# Patient Record
Sex: Female | Born: 1985 | Race: Black or African American | Hispanic: No | Marital: Single | Smoking: Current every day smoker
Health system: Southern US, Community
[De-identification: ages and names within clinical notes are randomized; demographics above are authoritative.]

## PROBLEM LIST (undated history)

## (undated) ENCOUNTER — Inpatient Hospital Stay (HOSPITAL_COMMUNITY): Payer: Self-pay

## (undated) DIAGNOSIS — R51 Headache: Secondary | ICD-10-CM

## (undated) DIAGNOSIS — G5600 Carpal tunnel syndrome, unspecified upper limb: Secondary | ICD-10-CM

## (undated) DIAGNOSIS — D649 Anemia, unspecified: Secondary | ICD-10-CM

## (undated) DIAGNOSIS — D573 Sickle-cell trait: Secondary | ICD-10-CM

## (undated) DIAGNOSIS — I10 Essential (primary) hypertension: Secondary | ICD-10-CM

## (undated) DIAGNOSIS — O139 Gestational [pregnancy-induced] hypertension without significant proteinuria, unspecified trimester: Secondary | ICD-10-CM

## (undated) DIAGNOSIS — F329 Major depressive disorder, single episode, unspecified: Secondary | ICD-10-CM

## (undated) DIAGNOSIS — IMO0002 Reserved for concepts with insufficient information to code with codable children: Secondary | ICD-10-CM

## (undated) DIAGNOSIS — F32A Depression, unspecified: Secondary | ICD-10-CM

## (undated) HISTORY — PX: DILATION AND CURETTAGE OF UTERUS: SHX78

## (undated) HISTORY — DX: Reserved for concepts with insufficient information to code with codable children: IMO0002

## (undated) HISTORY — DX: Sickle-cell trait: D57.3

## (undated) HISTORY — PX: INDUCED ABORTION: SHX677

---
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
## 2008-09-25 ENCOUNTER — Emergency Department (HOSPITAL_BASED_OUTPATIENT_CLINIC_OR_DEPARTMENT_OTHER): Admission: EM | Admit: 2008-09-25 | Discharge: 2008-09-25 | Payer: Self-pay | Admitting: Emergency Medicine

## 2009-04-12 ENCOUNTER — Emergency Department (HOSPITAL_BASED_OUTPATIENT_CLINIC_OR_DEPARTMENT_OTHER): Admission: EM | Admit: 2009-04-12 | Discharge: 2009-04-12 | Payer: Self-pay | Admitting: Emergency Medicine

## 2009-04-12 ENCOUNTER — Ambulatory Visit: Payer: Self-pay | Admitting: Interventional Radiology

## 2009-04-16 ENCOUNTER — Emergency Department (HOSPITAL_BASED_OUTPATIENT_CLINIC_OR_DEPARTMENT_OTHER): Admission: EM | Admit: 2009-04-16 | Discharge: 2009-04-16 | Payer: Self-pay | Admitting: Emergency Medicine

## 2009-04-26 ENCOUNTER — Emergency Department (HOSPITAL_BASED_OUTPATIENT_CLINIC_OR_DEPARTMENT_OTHER): Admission: EM | Admit: 2009-04-26 | Discharge: 2009-04-27 | Payer: Self-pay | Admitting: Emergency Medicine

## 2009-05-01 ENCOUNTER — Ambulatory Visit (HOSPITAL_BASED_OUTPATIENT_CLINIC_OR_DEPARTMENT_OTHER): Admission: RE | Admit: 2009-05-01 | Discharge: 2009-05-01 | Payer: Self-pay | Admitting: Emergency Medicine

## 2009-05-01 ENCOUNTER — Ambulatory Visit: Payer: Self-pay | Admitting: Radiology

## 2009-05-03 ENCOUNTER — Inpatient Hospital Stay (HOSPITAL_COMMUNITY): Admission: AD | Admit: 2009-05-03 | Discharge: 2009-05-03 | Payer: Self-pay | Admitting: Obstetrics & Gynecology

## 2009-08-27 ENCOUNTER — Emergency Department (HOSPITAL_BASED_OUTPATIENT_CLINIC_OR_DEPARTMENT_OTHER): Admission: EM | Admit: 2009-08-27 | Discharge: 2009-08-27 | Payer: Self-pay | Admitting: Emergency Medicine

## 2009-11-12 ENCOUNTER — Ambulatory Visit: Payer: Self-pay | Admitting: Advanced Practice Midwife

## 2009-11-12 ENCOUNTER — Inpatient Hospital Stay (HOSPITAL_COMMUNITY): Admission: AD | Admit: 2009-11-12 | Discharge: 2009-11-12 | Payer: Self-pay | Admitting: Obstetrics and Gynecology

## 2010-06-24 ENCOUNTER — Emergency Department (HOSPITAL_BASED_OUTPATIENT_CLINIC_OR_DEPARTMENT_OTHER)
Admission: EM | Admit: 2010-06-24 | Discharge: 2010-06-24 | Disposition: A | Discharge status: Home / Self Care | Payer: Medicaid Other | Attending: Emergency Medicine | Admitting: Emergency Medicine

## 2010-06-24 DIAGNOSIS — I10 Essential (primary) hypertension: Secondary | ICD-10-CM | POA: Insufficient documentation

## 2010-06-24 DIAGNOSIS — J329 Chronic sinusitis, unspecified: Secondary | ICD-10-CM | POA: Insufficient documentation

## 2010-06-24 DIAGNOSIS — F17200 Nicotine dependence, unspecified, uncomplicated: Secondary | ICD-10-CM | POA: Insufficient documentation

## 2010-07-06 LAB — GC/CHLAMYDIA PROBE AMP, GENITAL: GC Probe Amp, Genital: NEGATIVE

## 2010-07-06 LAB — CBC
Hemoglobin: 13.1 g/dL (ref 12.0–15.0)
MCHC: 34.1 g/dL (ref 30.0–36.0)
Platelets: 111 10*3/uL — ABNORMAL LOW (ref 150–400)
RBC: 4.61 MIL/uL (ref 3.87–5.11)
RDW: 14.1 % (ref 11.5–15.5)
WBC: 7.9 10*3/uL (ref 4.0–10.5)

## 2010-07-06 LAB — HCG, QUANTITATIVE, PREGNANCY
hCG, Beta Chain, Quant, S: 49173 m[IU]/mL — ABNORMAL HIGH (ref <5)
hCG, Beta Chain, Quant, S: 13813 m[IU]/mL — ABNORMAL HIGH (ref <5)

## 2010-07-06 LAB — URINE MICROSCOPIC-ADD ON

## 2010-07-06 LAB — URINALYSIS, ROUTINE W REFLEX MICROSCOPIC
Nitrite: NEGATIVE
Protein, ur: NEGATIVE mg/dL
Urobilinogen, UA: 4 mg/dL — ABNORMAL HIGH (ref 0.0–1.0)

## 2010-07-06 LAB — ABO/RH: ABO/RH(D): O POS

## 2010-07-21 LAB — CULTURE, ROUTINE-ABSCESS: Gram Stain: NONE SEEN

## 2010-07-21 LAB — URINALYSIS, ROUTINE W REFLEX MICROSCOPIC
Ketones, ur: 15 mg/dL — AB
Nitrite: NEGATIVE
Protein, ur: NEGATIVE mg/dL
Specific Gravity, Urine: 1.029 (ref 1.005–1.030)

## 2010-07-21 LAB — ABO/RH: ABO/RH(D): O POS

## 2010-07-21 LAB — GC/CHLAMYDIA PROBE AMP, GENITAL
Chlamydia, DNA Probe: NEGATIVE
GC Probe Amp, Genital: NEGATIVE

## 2010-07-21 LAB — WET PREP, GENITAL: Trich, Wet Prep: NONE SEEN

## 2010-07-28 LAB — URINALYSIS, ROUTINE W REFLEX MICROSCOPIC
Bilirubin Urine: NEGATIVE
Glucose, UA: NEGATIVE mg/dL
Hgb urine dipstick: NEGATIVE
Specific Gravity, Urine: 1.023 (ref 1.005–1.030)

## 2010-07-28 LAB — URINE MICROSCOPIC-ADD ON

## 2010-07-28 LAB — GC/CHLAMYDIA PROBE AMP, GENITAL
Chlamydia, DNA Probe: NEGATIVE
GC Probe Amp, Genital: NEGATIVE

## 2010-08-18 ENCOUNTER — Emergency Department (INDEPENDENT_AMBULATORY_CARE_PROVIDER_SITE_OTHER): Payer: Self-pay

## 2010-08-18 ENCOUNTER — Emergency Department (HOSPITAL_BASED_OUTPATIENT_CLINIC_OR_DEPARTMENT_OTHER)
Admission: EM | Admit: 2010-08-18 | Discharge: 2010-08-18 | Disposition: A | Discharge status: Home / Self Care | Payer: Self-pay | Attending: Emergency Medicine | Admitting: Emergency Medicine

## 2010-08-18 DIAGNOSIS — Y92009 Unspecified place in unspecified non-institutional (private) residence as the place of occurrence of the external cause: Secondary | ICD-10-CM | POA: Insufficient documentation

## 2010-08-18 DIAGNOSIS — IMO0002 Reserved for concepts with insufficient information to code with codable children: Secondary | ICD-10-CM | POA: Insufficient documentation

## 2010-08-18 DIAGNOSIS — W268XXA Contact with other sharp object(s), not elsewhere classified, initial encounter: Secondary | ICD-10-CM

## 2010-08-18 DIAGNOSIS — F17200 Nicotine dependence, unspecified, uncomplicated: Secondary | ICD-10-CM | POA: Insufficient documentation

## 2010-08-18 DIAGNOSIS — S61209A Unspecified open wound of unspecified finger without damage to nail, initial encounter: Secondary | ICD-10-CM

## 2010-08-18 DIAGNOSIS — I10 Essential (primary) hypertension: Secondary | ICD-10-CM | POA: Insufficient documentation

## 2010-09-05 NOTE — Discharge Summary (Signed)
Alison Coffey, Alison Coffey            ACCOUNT NO.:  [id number]   MEDICAL RECORD NO.:  [id number]          PATIENT TYPE:  INP   LOCATION:  9319                          FACILITY:  [facility]   PHYSICIAN:  Conni Elliot, M.D.DATE OF BIRTH:  1985/07/01   DATE OF ADMISSION:  07/08/2004  DATE OF DISCHARGE:  07/13/2004                                 DISCHARGE SUMMARY   ADMISSION DIAGNOSES:  25 year old gravida 1 presenting at 25 weeks and 4  days with preterm labor.   DISCHARGE DIAGNOSES:  1.  Status post spontaneous vaginal delivery at 26 weeks.  2.  Viable infant now in the neonatal intensive care unit at [facility].   DISCHARGE MEDICATIONS:  1.  Prenatal vitamins one tablet p.o. daily.  2.  Ibuprofen 600 mg one tablet p.o. q.8h p.r.n. pain.   HISTORY OF PRESENT ILLNESS:  The patient is an 25 year old gravida 1  presenting at 25 weeks and 4 days by LMP and 6-week ultrasound.  She had  been cramping for 2 days and went to her outside PCP and was found to have a  2 cm cervix with a bulging bag of water that was negative for fern with no  pooling.  The patient was feeling contractions.  There had been a  questionable loss of fluid.  At the outside facility, she was given a 6 gram  magnesium load and 2 grams an hour.  The patient reports that her cramping  decreased after the magnesium.  The patient was given Indomethacin p.o. and  betamethasone along with ampicillin before transfer.   HOSPITAL COURSE:  Problem 1.  The patient was admitted to [facility].  The patient was placed on tocometer and electronic fetal monitoring.  The  patient was continued on magnesium, Unasyn, and betamethasone.  NICU was  consulted and the patient was aware of the possibility of preterm delivery.  Over the following 2 days, the patient had approximately one episode of  cramping per day, but this resolved.  The patient noted continuous fetal  movement with no more loss of fluid.  On March 24, the  patient stated that  she was not feeling any contractions, however, vaginal examination noted the  vertex to be at a +2 to +3 station with eminent delivery likely.   The patient delivered at 1757 on July 11, 2004, over an intact perineum in  LOA position of a female.  There was spontaneous cry and breathing.  The  infant was bulb suctioned and taken to the NICU.  Per report of the NICU  staff, the baby was intubated on day of life #1, but was transitioned to  nasal cannula.   The mother's recovery was otherwise unremarkable and was controlled, her  bleeding decreased, and her hemoglobin was stable at 9.9.  The patient is to  be discharged today with follow-up with her primary care doctor in [facility].  She is planning on discussing IUD for contraception at follow-up.    GSD/MEDQ  D:  07/13/2004  T:  07/14/2004  Job:  [id number]   cc:  Arther Abbott, M.D., [facility]

## 2011-03-02 ENCOUNTER — Emergency Department (HOSPITAL_BASED_OUTPATIENT_CLINIC_OR_DEPARTMENT_OTHER)
Admission: EM | Admit: 2011-03-02 | Discharge: 2011-03-02 | Discharge status: Against Medical Advice | Payer: Managed Care, Other (non HMO) | Attending: Emergency Medicine | Admitting: Emergency Medicine

## 2011-03-02 ENCOUNTER — Encounter: Payer: Self-pay | Admitting: *Deleted

## 2011-03-02 DIAGNOSIS — R109 Unspecified abdominal pain: Secondary | ICD-10-CM | POA: Insufficient documentation

## 2011-03-02 NOTE — ED Notes (Signed)
Unable to locate pt in waiting room.

## 2011-03-02 NOTE — ED Notes (Signed)
Pt.  Went to her Dr. Joanna Puff for an injection to have an abortion under [redacted] wks pregnant at the time.  Pt. Reports abd. Pain in the lower abd. With no bleeding at present time.  Bleeding stopped on Sat.

## 2011-03-10 ENCOUNTER — Encounter (HOSPITAL_BASED_OUTPATIENT_CLINIC_OR_DEPARTMENT_OTHER): Payer: Self-pay | Admitting: *Deleted

## 2011-03-10 ENCOUNTER — Emergency Department (INDEPENDENT_AMBULATORY_CARE_PROVIDER_SITE_OTHER): Payer: Managed Care, Other (non HMO)

## 2011-03-10 ENCOUNTER — Emergency Department (HOSPITAL_BASED_OUTPATIENT_CLINIC_OR_DEPARTMENT_OTHER)
Admission: EM | Admit: 2011-03-10 | Discharge: 2011-03-10 | Disposition: A | Discharge status: Home / Self Care | Payer: Managed Care, Other (non HMO) | Attending: Emergency Medicine | Admitting: Emergency Medicine

## 2011-03-10 DIAGNOSIS — F17200 Nicotine dependence, unspecified, uncomplicated: Secondary | ICD-10-CM | POA: Insufficient documentation

## 2011-03-10 DIAGNOSIS — O26859 Spotting complicating pregnancy, unspecified trimester: Secondary | ICD-10-CM

## 2011-03-10 DIAGNOSIS — R0989 Other specified symptoms and signs involving the circulatory and respiratory systems: Secondary | ICD-10-CM

## 2011-03-10 DIAGNOSIS — N719 Inflammatory disease of uterus, unspecified: Secondary | ICD-10-CM | POA: Insufficient documentation

## 2011-03-10 DIAGNOSIS — O034 Incomplete spontaneous abortion without complication: Secondary | ICD-10-CM

## 2011-03-10 DIAGNOSIS — R109 Unspecified abdominal pain: Secondary | ICD-10-CM

## 2011-03-10 DIAGNOSIS — O021 Missed abortion: Secondary | ICD-10-CM | POA: Insufficient documentation

## 2011-03-10 LAB — URINALYSIS, ROUTINE W REFLEX MICROSCOPIC
Bilirubin Urine: NEGATIVE
Glucose, UA: NEGATIVE mg/dL
Ketones, ur: NEGATIVE mg/dL
Protein, ur: NEGATIVE mg/dL

## 2011-03-10 LAB — WET PREP, GENITAL
Trich, Wet Prep: NONE SEEN
Yeast Wet Prep HPF POC: NONE SEEN

## 2011-03-10 LAB — CBC
Hemoglobin: 11.9 g/dL — ABNORMAL LOW (ref 12.0–15.0)
MCHC: 36.5 g/dL — ABNORMAL HIGH (ref 30.0–36.0)
WBC: 6.6 10*3/uL (ref 4.0–10.5)

## 2011-03-10 LAB — DIFFERENTIAL
Basophils Absolute: 0 10*3/uL (ref 0.0–0.1)
Basophils Relative: 0 % (ref 0–1)
Eosinophils Absolute: 0.1 10*3/uL (ref 0.0–0.7)
Lymphocytes Relative: 31 % (ref 12–46)
Monocytes Relative: 9 % (ref 3–12)
Neutrophils Relative %: 59 % (ref 43–77)

## 2011-03-10 LAB — URINE MICROSCOPIC-ADD ON

## 2011-03-10 LAB — HCG, QUANTITATIVE, PREGNANCY: hCG, Beta Chain, Quant, S: 9159 m[IU]/mL — ABNORMAL HIGH (ref <5)

## 2011-03-10 MED ORDER — DOXYCYCLINE HYCLATE 100 MG PO CAPS: 100.0000 mg | ORAL_CAPSULE | Freq: Two times a day (BID) | ORAL | Status: AC

## 2011-03-10 MED ORDER — METRONIDAZOLE 500 MG PO TABS: 500.0000 mg | ORAL_TABLET | Freq: Two times a day (BID) | ORAL | Status: AC

## 2011-03-10 MED ORDER — CEFTRIAXONE SODIUM 250 MG IJ SOLR: 250.0000 mg | Freq: Once | INTRAMUSCULAR | Status: DC

## 2011-03-10 NOTE — ED Provider Notes (Signed)
History     CSN: [id number] Arrival date & time: 03/10/2011  9:45 AM   First MD Initiated Contact with Patient 03/10/11 [phone]      Chief Complaint  Patient presents with  . Abdominal Pain    (Consider location/radiation/quality/duration/timing/severity/associated sxs/prior treatment) Patient is a 25 y.o. female presenting with abdominal pain. The history is provided by the patient.  Abdominal Pain The primary symptoms of the illness include abdominal pain, vaginal discharge and vaginal bleeding. The primary symptoms of the illness do not include fever.  Symptoms associated with the illness do not include chills or back pain.   patient states she's had some vaginal pain and bleeding last few days. At the end of October or the beginning November she had methotrexate for an abortion. She's done well until recently. No fevers. She states she's had a little bit of a discharge. No fevers. She had a little bit dysuria, but that has resolved. She states that after sex the last time she's had some blood. She has had PID previously, and states this feels somewhat like that.  History reviewed. No pertinent past medical history.  Past Surgical History  Procedure Date  . Induced abortion   . Induced abortion     No family history on file.  History  Substance Use Topics  . Smoking status: Current Everyday Smoker  . Smokeless tobacco: Not on file  . Alcohol Use: Yes    OB History    Grav Para Term Preterm Abortions TAB SAB Ect Mult Living                  Review of Systems  Constitutional: Negative for fever and chills.  Respiratory: Negative for chest tightness.   Gastrointestinal: Positive for abdominal pain.  Genitourinary: Positive for vaginal bleeding, vaginal discharge, vaginal pain and pelvic pain.  Musculoskeletal: Negative for back pain.  Neurological: Negative for weakness.    Allergies  Review of patient's allergies indicates no known allergies.  Home Medications    Current Outpatient Rx  Name Route Sig Dispense Refill  . NATALCARE PIC 60-1 MG PO TABS Oral Take 1 tablet by mouth daily with breakfast.      . DOXYCYCLINE HYCLATE 100 MG PO CAPS Oral Take 1 capsule (100 mg total) by mouth 2 (two) times daily. 28 capsule 0  . METRONIDAZOLE 500 MG PO TABS Oral Take 1 tablet (500 mg total) by mouth 2 (two) times daily. 28 tablet 0    BP 112/68  Pulse 81  Temp(Src) 98.6 F (37 C) (Oral)  Resp 18  SpO2 100%  LMP 01/06/2011  Physical Exam  Nursing note and vitals reviewed. Constitutional: She is oriented to person, place, and time. She appears well-developed and well-nourished.  HENT:  Head: Normocephalic and atraumatic.  Eyes: EOM are normal. Pupils are equal, round, and reactive to light.  Neck: Normal range of motion. Neck supple.  Cardiovascular: Normal rate, regular rhythm and normal heart sounds.   No murmur heard. Pulmonary/Chest: Effort normal and breath sounds normal. No respiratory distress. She has no wheezes. She has no rales.  Abdominal: Soft. Bowel sounds are normal. She exhibits no distension. There is tenderness (suprapubic tenderness without rebound or guarding). There is no rebound and no guarding.  Genitourinary:       Patient has redness at the cervical os along with PERRLA discharge. Some pain with cervical motion. No adnexal mass felt.  Musculoskeletal: Normal range of motion.  Neurological: She is alert and oriented to person,  place, and time. No cranial nerve deficit.  Skin: Skin is warm and dry.  Psychiatric: She has a normal mood and affect. Her speech is normal.    ED Course  Procedures (including critical care time)  Labs Reviewed  WET PREP, GENITAL - Abnormal; Notable for the following:    Clue Cells, Wet Prep FEW (*)    WBC, Wet Prep HPF POC MANY (*)    All other components within normal limits  URINALYSIS, ROUTINE W REFLEX MICROSCOPIC - Abnormal; Notable for the following:    Hgb urine dipstick TRACE (*)     Leukocytes, UA MODERATE (*)    All other components within normal limits  URINE MICROSCOPIC-ADD ON - Abnormal; Notable for the following:    Squamous Epithelial / LPF FEW (*)    Bacteria, UA FEW (*)    All other components within normal limits  HCG, QUANTITATIVE, PREGNANCY - Abnormal; Notable for the following:    hCG, Beta Chain, Quant, S 9159 (*)    All other components within normal limits  CBC - Abnormal; Notable for the following:    Hemoglobin 11.9 (*)    HCT 32.6 (*)    MCV 74.8 (*)    MCHC 36.5 (*)    Platelets 136 (*)    All other components within normal limits  PREGNANCY, URINE  DIFFERENTIAL  GC/CHLAMYDIA PROBE AMP, GENITAL   US Ob Comp Less 14 Wks  03/10/2011  *RADIOLOGY REPORT*  Clinical Data: Abdominal and pelvic pain.  Vaginal bleeding and discharge.  3 weeks status post induced abortion.  Positive pregnancy test.  OBSTETRIC <14 WK ULTRASOUND AND TRANSVAGINAL OB US  Technique:  Transabdominal and transvaginal ultrasound was performed for evaluation of the gestation as well as the maternal uterus and adnexal regions.  Comparison:  None.  Intrauterine gestational sac: Visualized/irregular in shape. Yolk sac: Visualized Embryo: Visualized Cardiac Activity: None  CRL:  11 mm  7w  2d           [place] EDC: 10/25/2011  Maternal uterus/Adnexae: No subchorionic hemorrhage identified.  No evidence of fibroids. Both ovaries are normal appearance.  No evidence of adnexal mass or free fluid.  IMPRESSION:  1.  Failed IUP measuring 7 weeks 2 days. 2.  No evidence of adnexal mass or free fluid.  Original Report Authenticated By: Danae Orleans, M.D.   US Ob Transvaginal  03/10/2011  *RADIOLOGY REPORT*  Clinical Data: Abdominal and pelvic pain.  Vaginal bleeding and discharge.  3 weeks status post induced abortion.  Positive pregnancy test.  OBSTETRIC <14 WK ULTRASOUND AND TRANSVAGINAL OB US  Technique:  Transabdominal and transvaginal ultrasound was performed for evaluation of the gestation as  well as the maternal uterus and adnexal regions.  Comparison:  None.  Intrauterine gestational sac: Visualized/irregular in shape. Yolk sac: Visualized Embryo: Visualized Cardiac Activity: None  CRL:  11 mm  7w  2d           [place] EDC: 10/25/2011  Maternal uterus/Adnexae: No subchorionic hemorrhage identified.  No evidence of fibroids. Both ovaries are normal appearance.  No evidence of adnexal mass or free fluid.  IMPRESSION:  1.  Failed IUP measuring 7 weeks 2 days. 2.  No evidence of adnexal mass or free fluid.  Original Report Authenticated By: Danae Orleans, M.D.     1. Retained products of conception following abortion   2. Endometritis       MDM  Patient comes in after having pharmacologic abortion. It was about  the end of October. She states she's had an increase in vaginal discharge and pain. Her quantitative hCG is elevated at 9000. Per patient he was about 6000 at the time of the abortion. Pelvic exam shows PERRLA drainage from cervix. She does have many white cells, wet prep. Ultrasound shows a failed intrauterine pregnancy measuring 7 weeks. I attempted to contact her primary OB/GYN Dr. Shawnie Pons, however his office was closed at this time. Patient states she cannot wait for me to contact him and she will call him. She will be treated as endometritis. She is well-appearing and does not appear septic. She'll be discharged.        Juliet Rude. Rubin Payor, MD 03/10/11 1250

## 2011-03-10 NOTE — ED Notes (Signed)
Patient states that she has had intermittent abd pain since an induced abortion, spotting

## 2011-03-11 LAB — GC/CHLAMYDIA PROBE AMP, GENITAL: GC Probe Amp, Genital: NEGATIVE

## 2011-06-24 ENCOUNTER — Encounter (HOSPITAL_BASED_OUTPATIENT_CLINIC_OR_DEPARTMENT_OTHER): Payer: Self-pay | Admitting: *Deleted

## 2011-06-24 ENCOUNTER — Emergency Department (HOSPITAL_BASED_OUTPATIENT_CLINIC_OR_DEPARTMENT_OTHER)
Admission: EM | Admit: 2011-06-24 | Discharge: 2011-06-24 | Disposition: A | Discharge status: Home Health | Payer: Self-pay | Attending: Emergency Medicine | Admitting: Emergency Medicine

## 2011-06-24 DIAGNOSIS — R509 Fever, unspecified: Secondary | ICD-10-CM | POA: Insufficient documentation

## 2011-06-24 DIAGNOSIS — R1110 Vomiting, unspecified: Secondary | ICD-10-CM | POA: Insufficient documentation

## 2011-06-24 DIAGNOSIS — R197 Diarrhea, unspecified: Secondary | ICD-10-CM | POA: Insufficient documentation

## 2011-06-24 DIAGNOSIS — F17200 Nicotine dependence, unspecified, uncomplicated: Secondary | ICD-10-CM | POA: Insufficient documentation

## 2011-06-24 LAB — URINALYSIS, ROUTINE W REFLEX MICROSCOPIC
Glucose, UA: NEGATIVE mg/dL
Protein, ur: 30 mg/dL — AB
Urobilinogen, UA: 2 mg/dL — ABNORMAL HIGH (ref 0.0–1.0)

## 2011-06-24 LAB — WET PREP, GENITAL: Yeast Wet Prep HPF POC: NONE SEEN

## 2011-06-24 LAB — PREGNANCY, URINE: Preg Test, Ur: NEGATIVE

## 2011-06-24 MED ORDER — ONDANSETRON HCL 4 MG/2ML IJ SOLN
4.0000 mg | Freq: Once | INTRAMUSCULAR | Status: AC
  Administered 2011-06-24: 4 mg via INTRAVENOUS
  Filled 2011-06-24: qty 2

## 2011-06-24 MED ORDER — FLUCONAZOLE 100 MG PO TABS
200.0000 mg | ORAL_TABLET | Freq: Once | ORAL | Status: AC
  Administered 2011-06-24: 200 mg via ORAL
  Filled 2011-06-24: qty 2

## 2011-06-24 MED ORDER — SODIUM CHLORIDE 0.9 % IV BOLUS (SEPSIS)
1000.0000 mL | Freq: Once | INTRAVENOUS | Status: AC
  Administered 2011-06-24: 1000 mL via INTRAVENOUS

## 2011-06-24 NOTE — ED Notes (Signed)
Pt c/o vomiting and lose stools x 3 days

## 2011-06-24 NOTE — ED Notes (Signed)
MD at bedside. 

## 2011-06-24 NOTE — ED Provider Notes (Addendum)
History     CSN: [id number]  Arrival date & time 06/24/11  1306   First MD Initiated Contact with Patient 06/24/11 1324      Chief Complaint  Patient presents with  . Emesis  . Diarrhea    (Consider location/radiation/quality/duration/timing/severity/associated sxs/prior treatment) Patient is a 26 y.o. female presenting with vomiting and diarrhea. The history is provided by the patient.  Emesis  This is a new problem. The current episode started 2 days ago. The problem occurs 2 to 4 times per day. The problem has been resolved. The emesis has an appearance of stomach contents. The maximum temperature recorded prior to her arrival was 100 to 100.9 F. The fever has been present for less than 1 day. Associated symptoms include abdominal pain, chills, diarrhea and a fever. Pertinent negatives include no cough and no URI. Risk factors include ill contacts (The same symptoms 2 days prior).  Diarrhea The primary symptoms include fever, abdominal pain, vomiting and diarrhea.  The diarrhea is watery. The diarrhea occurs 5 to 10 times per day.  The illness is also significant for chills and anorexia. Associated medical issues do not include GERD, gallstones or liver disease. Risk factors: None.    History reviewed. No pertinent past medical history.  Past Surgical History  Procedure Date  . Induced abortion   . Induced abortion     History reviewed. No pertinent family history.  History  Substance Use Topics  . Smoking status: Current Everyday Smoker  . Smokeless tobacco: Not on file  . Alcohol Use: Yes    OB History    Grav Para Term Preterm Abortions TAB SAB Ect Mult Living                  Review of Systems  Constitutional: Positive for fever and chills.  Respiratory: Negative for cough.   Gastrointestinal: Positive for vomiting, abdominal pain, diarrhea and anorexia.  All other systems reviewed and are negative.    Allergies  Review of patient's allergies indicates  no known allergies.  Home Medications   Current Outpatient Rx  Name Route Sig Dispense Refill  . NATALCARE PIC 60-1 MG PO TABS Oral Take 1 tablet by mouth daily with breakfast.        BP 127/78  Pulse 83  Temp(Src) 98.3 F (36.8 C) (Oral)  Resp 16  Ht 5\' 6"  (1.676 m)  Wt 160 lb (72.576 kg)  BMI 25.82 kg/m2  SpO2 100%  LMP 06/24/2011  Physical Exam  Nursing note and vitals reviewed. Constitutional: She is oriented to person, place, and time. She appears well-developed and well-nourished. No distress.  HENT:  Head: Normocephalic and atraumatic.  Eyes: EOM are normal. Pupils are equal, round, and reactive to light.  Cardiovascular: Normal rate, regular rhythm, normal heart sounds and intact distal pulses.  Exam reveals no friction rub.   No murmur heard. Pulmonary/Chest: Effort normal and breath sounds normal. She has no wheezes. She has no rales.  Abdominal: Soft. Bowel sounds are normal. She exhibits no distension. There is tenderness in the right lower quadrant. There is no rebound, no guarding and no CVA tenderness.       Mild tenderness in her right lower quadrant  Genitourinary: Uterus normal. Cervix exhibits no motion tenderness, no discharge and no friability. Right adnexum displays no tenderness. Left adnexum displays no tenderness. There is bleeding around the vagina.  Musculoskeletal: Normal range of motion. She exhibits no tenderness.       No edema  Neurological: She is alert and oriented to person, place, and time. No cranial nerve deficit.  Skin: Skin is warm and dry. No rash noted.  Psychiatric: She has a normal mood and affect. Her behavior is normal.    ED Course  Procedures (including critical care time)  Labs Reviewed  URINALYSIS, ROUTINE W REFLEX MICROSCOPIC - Abnormal; Notable for the following:    Color, Urine RED (*) BIOCHEMICALS MAY BE AFFECTED BY COLOR   APPearance CLOUDY (*)    Specific Gravity, Urine 1.034 (*)    Hgb urine dipstick LARGE (*)     Bilirubin Urine SMALL (*)    Protein, ur 30 (*)    Urobilinogen, UA 2.0 (*)    Leukocytes, UA SMALL (*)    All other components within normal limits  URINE MICROSCOPIC-ADD ON - Abnormal; Notable for the following:    Squamous Epithelial / LPF FEW (*)    Bacteria, UA MANY (*)    All other components within normal limits  PREGNANCY, URINE   No results found.   1. Vomiting and diarrhea       MDM   Pt with symptoms most consistent with a viral process with fever/vomitting/diarrhea.  Denies bad food exposure and recent travel out of the country.  No recent abx.  No hx concerning for GU pathology or kidney stones.  Pt is awake and alert on exam without peritoneal signs.  Time with the same symptoms 2 days before her started. She has mild abdominal pain on exam in the right lower quadrant. Will give IV fluids and recheck her persistent abdominal pain.  2:29 PM After IV fluids and Zofran on reevaluation patient has no right lower quadrant pain. She was not given any pain medication during her stay in the abdominal pain resolved after fluids. Low suspicion for appendicitis. Patient given crackers and ginger ale without any further vomiting. UA showed a large amount of blood and small leukocytes  however feel this was a contaminated sample. Patient started complaining of any dysuria and currently is on her menses.  Patient denies any dysuria however she states she has been having vaginal itching without discharge for the last one week.  3:29 PM Wet prep showed moderate white blood cells and signs of bacterial vaginosis. Will treat for yeast as she states the Monistat helped some. Patient will followup with her GYN     Gwyneth Sprout, MD 06/24/11 1430  Gwyneth Sprout, MD 06/24/11 1456  Gwyneth Sprout, MD 06/24/11 1610  Gwyneth Sprout, MD 06/24/11 1529

## 2011-06-24 NOTE — ED Notes (Signed)
Secondary Assessment- Pt reports nausea, diarrhea and vomiting since Monday.  States only nausea today but has had numerous episodes of diarrhea today.

## 2011-06-24 NOTE — Discharge Instructions (Signed)
Diet for Diarrhea, Adult Having frequent, runny stools (diarrhea) has many causes. Diarrhea may be caused or worsened by food or drink. Diarrhea may be relieved by changing your diet. IF YOU ARE NOT TOLERATING SOLID FOODS:  Drink enough water and fluids to keep your urine clear or pale yellow.   Avoid sugary drinks and sodas as well as milk-based beverages.   Avoid beverages containing caffeine and alcohol.   You may try rehydrating beverages. You can make your own by following this recipe:    tsp table salt.    tsp baking soda.   ? tsp salt substitute (potassium chloride).   1 tbs + 1 tsp sugar.   1 qt water.  As your stools become more solid, you can start eating solid foods. Add foods one at a time. If a certain food causes your diarrhea to get worse, avoid that food and try other foods. A low fiber, low-fat, and lactose-free diet is recommended. Small, frequent meals may be better tolerated.  Starches  Allowed:  White, French, and pita breads, plain rolls, buns, bagels. Plain muffins, matzo. Soda, saltine, or graham crackers. Pretzels, melba toast, zwieback. Cooked cereals made with water: cornmeal, farina, cream cereals. Dry cereals: refined corn, wheat, rice. Potatoes prepared any way without skins, refined macaroni, spaghetti, noodles, refined rice.   Avoid:  Bread, rolls, or crackers made with whole wheat, multi-grains, rye, bran seeds, nuts, or coconut. Corn tortillas or taco shells. Cereals containing whole grains, multi-grains, bran, coconut, nuts, or raisins. Cooked or dry oatmeal. Coarse wheat cereals, granola. Cereals advertised as "high-fiber." Potato skins. Whole grain pasta, wild or brown rice. Popcorn. Sweet potatoes/yams. Sweet rolls, doughnuts, waffles, pancakes, sweet breads.  Vegetables  Allowed: Strained tomato and vegetable juices. Most well-cooked and canned vegetables without seeds. Fresh: Tender lettuce, cucumber without the skin, cabbage, spinach, bean  sprouts.   Avoid: Fresh, cooked, or canned: Artichokes, baked beans, beet greens, broccoli, Brussels sprouts, corn, kale, legumes, peas, sweet potatoes. Cooked: Green or red cabbage, spinach. Avoid large servings of any vegetables, because vegetables shrink when cooked, and they contain more fiber per serving than fresh vegetables.  Fruit  Allowed: All fruit juices except prune juice. Cooked or canned: Apricots, applesauce, cantaloupe, cherries, fruit cocktail, grapefruit, grapes, kiwi, mandarin oranges, peaches, pears, plums, watermelon. Fresh: Apples without skin, ripe banana, grapes, cantaloupe, cherries, grapefruit, peaches, oranges, plums. Keep servings limited to  cup or 1 piece.   Avoid: Fresh: Apple with skin, apricots, mango, pears, raspberries, strawberries. Prune juice, stewed or dried prunes. Dried fruits, raisins, dates. Large servings of all fresh fruits.  Meat and Meat Substitutes  Allowed: Ground or well-cooked tender beef, ham, veal, lamb, pork, or poultry. Eggs, plain cheese. Fish, oysters, shrimp, lobster, other seafoods. Liver, organ meats.   Avoid: Tough, fibrous meats with gristle. Peanut butter, smooth or chunky. Cheese, nuts, seeds, legumes, dried peas, beans, lentils.  Milk  Allowed: Yogurt, lactose-free milk, kefir, drinkable yogurt, buttermilk, soy milk.   Avoid: Milk, chocolate milk, beverages made with milk, such as milk shakes.  Soups  Allowed: Bouillon, broth, or soups made from allowed foods. Any strained soup.   Avoid: Soups made from vegetables that are not allowed, cream or milk-based soups.  Desserts and Sweets  Allowed: Sugar-free gelatin, sugar-free frozen ice pops made without sugar alcohol.   Avoid: Plain cakes and cookies, pie made with allowed fruit, pudding, custard, cream pie. Gelatin, fruit, ice, sherbet, frozen ice pops. Ice cream, ice milk without nuts. Plain hard candy,   honey, jelly, molasses, syrup, sugar, chocolate syrup, gumdrops,  marshmallows.  Fats and Oils  Allowed: Avoid any fats and oils.   Avoid: Seeds, nuts, olives, avocados. Margarine, butter, cream, mayonnaise, salad oils, plain salad dressings made from allowed foods. Plain gravy, crisp bacon without rind.  Beverages  Allowed: Water, decaffeinated teas, oral rehydration solutions, sugar-free beverages.   Avoid: Fruit juices, caffeinated beverages (coffee, tea, soda or pop), alcohol, sports drinks, or lemon-lime soda or pop.  Condiments  Allowed: Ketchup, mustard, horseradish, vinegar, cream sauce, cheese sauce, cocoa powder. Spices in moderation: allspice, basil, bay leaves, celery powder or leaves, cinnamon, cumin powder, curry powder, ginger, mace, marjoram, onion or garlic powder, oregano, paprika, parsley flakes, ground pepper, rosemary, sage, savory, tarragon, thyme, turmeric.   Avoid: Coconut, honey.  Weight Monitoring: Weigh yourself every day. You should weigh yourself in the morning after you urinate and before you eat breakfast. Wear the same amount of clothing when you weigh yourself. Record your weight daily. Bring your recorded weights to your clinic visits. Tell your caregiver right away if you have gained 3 lb/1.4 kg or more in 1 day, 5 lb/2.3 kg in a week, or whatever amount you were told to report. SEEK IMMEDIATE MEDICAL CARE IF:   You are unable to keep fluids down.   You start to throw up (vomit) or diarrhea keeps coming back (persistent).   Abdominal pain develops, increases, or can be felt in one place (localizes).   You have an oral temperature above 102 F (38.9 C), not controlled by medicine.   Diarrhea contains blood or mucus.   You develop excessive weakness, dizziness, fainting, or extreme thirst.  MAKE SURE YOU:   Understand these instructions.   Will watch your condition.   Will get help right away if you are not doing well or get worse.  Document Released: 06/27/2003 Document Revised: 03/26/2011 Document Reviewed:  10/18/2008 [employer] Patient Information 2012 [employer]. 

## 2011-08-15 ENCOUNTER — Emergency Department (HOSPITAL_BASED_OUTPATIENT_CLINIC_OR_DEPARTMENT_OTHER)
Admission: EM | Admit: 2011-08-15 | Discharge: 2011-08-15 | Disposition: A | Discharge status: Home / Self Care | Payer: Self-pay | Attending: Emergency Medicine | Admitting: Emergency Medicine

## 2011-08-15 ENCOUNTER — Encounter (HOSPITAL_BASED_OUTPATIENT_CLINIC_OR_DEPARTMENT_OTHER): Payer: Self-pay | Admitting: *Deleted

## 2011-08-15 DIAGNOSIS — F17200 Nicotine dependence, unspecified, uncomplicated: Secondary | ICD-10-CM | POA: Insufficient documentation

## 2011-08-15 DIAGNOSIS — H109 Unspecified conjunctivitis: Secondary | ICD-10-CM | POA: Insufficient documentation

## 2011-08-15 DIAGNOSIS — I10 Essential (primary) hypertension: Secondary | ICD-10-CM | POA: Insufficient documentation

## 2011-08-15 HISTORY — DX: Essential (primary) hypertension: I10

## 2011-08-15 MED ORDER — NAPHAZOLINE-PHENIRAMINE 0.025-0.3 % OP SOLN: 1.0000 [drp] | OPHTHALMIC | Status: DC | PRN

## 2011-08-15 MED ORDER — NAPHAZOLINE-PHENIRAMINE 0.025-0.3 % OP SOLN: 1.0000 [drp] | OPHTHALMIC | Status: AC | PRN

## 2011-08-15 NOTE — Discharge Instructions (Signed)
Stop taking the eyedrops you have been using at home. Use Naphcon a drops every 4 hours as needed for itching. Return if symptoms are getting worse. See your eye doctor if symptoms are not improving after another 2-3 days.  Conjunctivitis Conjunctivitis is commonly called "pink eye." Conjunctivitis can be caused by bacterial or viral infection, allergies, or injuries. There is usually redness of the lining of the eye, itching, discomfort, and sometimes discharge. There may be deposits of matter along the eyelids. A viral infection usually causes a watery discharge, while a bacterial infection causes a yellowish, thick discharge. Pink eye is very contagious and spreads by direct contact. You may be given antibiotic eyedrops as part of your treatment. Before using your eye medicine, remove all drainage from the eye by washing gently with warm water and cotton balls. Continue to use the medication until you have awakened 2 mornings in a row without discharge from the eye. Do not rub your eye. This increases the irritation and helps spread infection. Use separate towels from other household members. Wash your hands with soap and water before and after touching your eyes. Use cold compresses to reduce pain and sunglasses to relieve irritation from light. Do not wear contact lenses or wear eye makeup until the infection is gone. SEEK MEDICAL CARE IF:   Your symptoms are not better after 3 days of treatment.   You have increased pain or trouble seeing.   The outer eyelids become very red or swollen.  Document Released: 05/14/2004 Document Revised: 03/26/2011 Document Reviewed: 04/06/2005 [facility] Patient Information 2012 [place], [place].

## 2011-08-15 NOTE — ED Notes (Signed)
Since Wednesday patient has had red eyes with drainage and discomfort

## 2011-08-15 NOTE — ED Provider Notes (Signed)
History     CSN: [id number]  Arrival date & time 08/15/11  0054   First MD Initiated Contact with Patient 08/15/11 0240      Chief Complaint  Patient presents with  . Eye Drainage    (Consider location/radiation/quality/duration/timing/severity/associated sxs/prior treatment) The history is provided by the patient.   26 year old female has had red eyes for the last 2 days. She thought she had pinkeye and use some drops which were left over from a prior episode of pink eye but it has not helped and in fact her symptoms are getting worse. Her eyes are itchy. Drainage is watery. There is no matting. She denies photophobia or fever. She has had some slight rhinorrhea but no cough, sore throat, nausea, vomiting, diarrhea, arthralgias, or myalgias.  Past Medical History  Diagnosis Date  . Hypertension     Past Surgical History  Procedure Date  . Induced abortion   . Induced abortion     No family history on file.  History  Substance Use Topics  . Smoking status: Current Everyday Smoker  . Smokeless tobacco: Not on file  . Alcohol Use: Yes    OB History    Grav Para Term Preterm Abortions TAB SAB Ect Mult Living                  Review of Systems  All other systems reviewed and are negative.    Allergies  Review of patient's allergies indicates no known allergies.  Home Medications   Current Outpatient Rx  Name Route Sig Dispense Refill  . GOODY HEADACHE PO Oral Take 1 packet by mouth daily as needed. Patient used this medication for a headache.      BP 121/79  Pulse 64  Temp(Src) 98.3 F (36.8 C) (Oral)  Resp 20  Ht 5\' 6"  (1.676 m)  Wt 165 lb (74.844 kg)  BMI 26.63 kg/m2  SpO2 100%  Physical Exam  Nursing note and vitals reviewed.  26 year old female who is resting comfortably and in no acute distress. Vital signs are normal. Oxygen saturation is 100% which is normal. Head is normocephalic and atraumatic. PERRLA, EOMI. There is moderate conjunctival  injection bilaterally. Anterior chamber is clear. There is no palpable preop irregular lymph node. Oropharynx is clear. Neck is nontender and supple without adenopathy. Lungs are clear without rales, wheezes, rhonchi. Heart has regular rate and rhythm without murmur. Abdomen is soft, flat, nontender without masses or hepatosplenomegaly. Extremities have full range of motion, no cyanosis or edema. Skin is warm and dry without rash. Neurologic: Mental status is normal, cranial nerves are intact, there no focal motor or sensory deficits.  ED Course  Procedures (including critical care time)    1. Conjunctivitis       MDM  Conjunctivitis oh which presumably is viral although there may be a component of allergy. Specifically, she may have some allergy to her antibiotic which have been used previously and that would be one reason or symptoms of get worse while using the eyedrops. She will be sent home with a prescription for Naphcon-A drops which should give relief of itching and she will be told to discontinue the eyedrops she had been using.        Dione Booze, MD 08/15/11 [phone]

## 2011-11-28 ENCOUNTER — Encounter (HOSPITAL_COMMUNITY): Payer: Self-pay | Admitting: *Deleted

## 2011-11-28 ENCOUNTER — Inpatient Hospital Stay (HOSPITAL_COMMUNITY)
Admission: AD | Admit: 2011-11-28 | Discharge: 2011-11-28 | Disposition: A | Discharge status: Home / Self Care | Payer: Self-pay | Source: Ambulatory Visit | Attending: Obstetrics & Gynecology | Admitting: Obstetrics & Gynecology

## 2011-11-28 DIAGNOSIS — I10 Essential (primary) hypertension: Secondary | ICD-10-CM | POA: Insufficient documentation

## 2011-11-28 DIAGNOSIS — N949 Unspecified condition associated with female genital organs and menstrual cycle: Secondary | ICD-10-CM | POA: Insufficient documentation

## 2011-11-28 DIAGNOSIS — F17200 Nicotine dependence, unspecified, uncomplicated: Secondary | ICD-10-CM | POA: Insufficient documentation

## 2011-11-28 DIAGNOSIS — N938 Other specified abnormal uterine and vaginal bleeding: Secondary | ICD-10-CM | POA: Insufficient documentation

## 2011-11-28 DIAGNOSIS — D696 Thrombocytopenia, unspecified: Secondary | ICD-10-CM | POA: Insufficient documentation

## 2011-11-28 DIAGNOSIS — N921 Excessive and frequent menstruation with irregular cycle: Secondary | ICD-10-CM

## 2011-11-28 HISTORY — DX: Gestational (pregnancy-induced) hypertension without significant proteinuria, unspecified trimester: O13.9

## 2011-11-28 LAB — URINALYSIS, ROUTINE W REFLEX MICROSCOPIC
Glucose, UA: NEGATIVE mg/dL
Ketones, ur: NEGATIVE mg/dL
Leukocytes, UA: NEGATIVE
pH: 6 (ref 5.0–8.0)

## 2011-11-28 LAB — CBC
MCH: 26.3 pg (ref 26.0–34.0)
MCHC: 35.7 g/dL (ref 30.0–36.0)
Platelets: 109 10*3/uL — ABNORMAL LOW (ref 150–400)
RBC: 4.75 MIL/uL (ref 3.87–5.11)

## 2011-11-28 LAB — WET PREP, GENITAL: Yeast Wet Prep HPF POC: NONE SEEN

## 2011-11-28 LAB — URINE MICROSCOPIC-ADD ON

## 2011-11-28 LAB — POCT PREGNANCY, URINE: Preg Test, Ur: NEGATIVE

## 2011-11-28 MED ORDER — DOXYCYCLINE HYCLATE 50 MG PO CAPS: 100.0000 mg | ORAL_CAPSULE | Freq: Two times a day (BID) | ORAL | Status: AC

## 2011-11-28 NOTE — MAU Note (Signed)
[facility] was unable to locate any record of pt being at their hospital. Pt asked if she may have signed in at [facility] under any other name and she stated no.

## 2011-11-28 NOTE — MAU Provider Note (Signed)
History     CSN: [id number]  Arrival date and time: 11/28/11 0107   First Provider Initiated Contact with Patient 11/28/11 0221      Chief Complaint  Patient presents with  . Vaginal Bleeding  . Abdominal Pain  . Fatigue   HPI This is a 26 y.o. female who presents with c/o bleeding with Implanon since January. Has bled almost every day since. Sometimes heavy. Feels "bad" this week, with dizziness, malaise.  Denies fever Has abdominal cramping. Was seen in [facility] ED Tuesday and worked up for abd. Pain with bloodwork and CT scan. Did not have a pelvic exam. Was told she did not have appendicitis. Was told she was very anemic. States [facility] could not see her until September and was told we could take out her Implanon here in our MAU.   OB History    Grav Para Term Preterm Abortions TAB SAB Ect Mult Living   6 4 3 1 2 2    4       Past Medical History  Diagnosis Date  . Hypertension   . Pregnancy induced hypertension     Past Surgical History  Procedure Date  . Induced abortion   . Induced abortion     Family History  Problem Relation Age of Onset  . Other Neg Hx     History  Substance Use Topics  . Smoking status: Current Everyday Smoker  . Smokeless tobacco: Not on file  . Alcohol Use: Yes     social    Allergies: No Known Allergies  Prescriptions prior to admission  Medication Sig Dispense Refill  . Aspirin-Acetaminophen-Caffeine (GOODY HEADACHE PO) Take 1 packet by mouth daily as needed. Patient used this medication for a headache.        ROS As in HPI  Physical Exam   Blood pressure 129/98, pulse 97, temperature 98.1 F (36.7 C), temperature source Oral, resp. rate 20, height 5\' 6"  (1.676 m), weight 151 lb 6.4 oz (68.675 kg), last menstrual period 11/11/2011, SpO2 100.00%, not currently breastfeeding.  Physical Exam  Constitutional: She is oriented to person, place, and time. She appears well-developed and well-nourished. No distress.    Cardiovascular: Normal rate.   Respiratory: Effort normal.  GI: Soft. She exhibits no distension and no mass. There is tenderness (lower pelvic). There is no rebound and no guarding.  Genitourinary: Uterus normal. Vaginal discharge (light blood) found.       Uterus and adnexae diffusely tender but no CMT.  Musculoskeletal: Normal range of motion.  Neurological: She is alert and oriented to person, place, and time.  Skin: Skin is warm and dry.  Psychiatric: She has a normal mood and affect.   Results for orders placed during the hospital encounter of 11/28/11 (from the past 24 hour(s))  URINALYSIS, ROUTINE W REFLEX MICROSCOPIC     Status: Abnormal   Collection Time   11/28/11  1:15 AM      Component Value Range   Color, Urine YELLOW  YELLOW   APPearance CLEAR  CLEAR   Specific Gravity, Urine >1.030 (*) 1.005 - 1.030   pH 6.0  5.0 - 8.0   Glucose, UA NEGATIVE  NEGATIVE mg/dL   Hgb urine dipstick LARGE (*) NEGATIVE   Bilirubin Urine NEGATIVE  NEGATIVE   Ketones, ur NEGATIVE  NEGATIVE mg/dL   Protein, ur NEGATIVE  NEGATIVE mg/dL   Urobilinogen, UA 1.0  0.0 - 1.0 mg/dL   Nitrite NEGATIVE  NEGATIVE   Leukocytes, UA NEGATIVE  NEGATIVE  URINE MICROSCOPIC-ADD ON     Status: Abnormal   Collection Time   11/28/11  1:15 AM      Component Value Range   Squamous Epithelial / LPF RARE  RARE   WBC, UA 0-2  <3 WBC/hpf   RBC / HPF 3-6  <3 RBC/hpf   Bacteria, UA FEW (*) RARE  POCT PREGNANCY, URINE     Status: Normal   Collection Time   11/28/11  1:30 AM      Component Value Range   Preg Test, Ur NEGATIVE  NEGATIVE  WET PREP, GENITAL     Status: Abnormal   Collection Time   11/28/11  2:15 AM      Component Value Range   Yeast Wet Prep HPF POC NONE SEEN  NONE SEEN   Trich, Wet Prep NONE SEEN  NONE SEEN   Clue Cells Wet Prep HPF POC FEW (*) NONE SEEN   WBC, Wet Prep HPF POC FEW (*) NONE SEEN  CBC     Status: Abnormal   Collection Time   11/28/11  2:44 AM      Component Value Range   WBC  6.2  4.0 - 10.5 K/uL   RBC 4.75  3.87 - 5.11 MIL/uL   Hemoglobin 12.5  12.0 - 15.0 g/dL   HCT 56.2 (*) 13.0 - 86.5 %   MCV 73.7 (*) 78.0 - 100.0 fL   MCH 26.3  26.0 - 34.0 pg   MCHC 35.7  30.0 - 36.0 g/dL   RDW 78.4  69.6 - 29.5 %   Platelets 109 (*) 150 - 400 K/uL  Chart reviewed from [facility]:  CT Neg except for constipation Hgb there was 8.9 on 11/24/11, though 12.5 here   MAU Course  Procedures   Assessment and Plan  A:  Breakthrough bleeding on Implanon      Unexplained thrombocytopenia     Chronic hypertension      Smoker      No evidence of anemia per our labs  P:  Discussed normal labs       OCP use not optimal due to pt's smoking and hypertension      Will try Doxycyline 100mg  bid x 5 days for bleeding (Chegini et al, 2007)      Pt prefers to followup with her OB/GYN Dr Shawnie Pons for Implanon removal      Recommend follow up with Primary MD for hypertension and thrombocytopenia              [facility] 11/28/2011, 2:26 AM

## 2011-11-28 NOTE — Progress Notes (Signed)
Written and verbal d/c instructions given and understanding voiced. 

## 2011-11-28 NOTE — Progress Notes (Signed)
M Williams CNM notified of pt's admission and status. Will see pt 

## 2011-11-28 NOTE — MAU Note (Signed)
Had implanon put in in January. Have bled since then except for about 3 wks. Think my last period started 7/24. Having some pain in stomach that sometimes radiates to lower stomach and back. Sometimes upper stomach burns like when ya drink OJ

## 2012-03-23 ENCOUNTER — Encounter (HOSPITAL_BASED_OUTPATIENT_CLINIC_OR_DEPARTMENT_OTHER): Payer: Self-pay | Admitting: Emergency Medicine

## 2012-03-23 ENCOUNTER — Emergency Department (HOSPITAL_BASED_OUTPATIENT_CLINIC_OR_DEPARTMENT_OTHER): Payer: Self-pay

## 2012-03-23 ENCOUNTER — Emergency Department (HOSPITAL_BASED_OUTPATIENT_CLINIC_OR_DEPARTMENT_OTHER)
Admission: EM | Admit: 2012-03-23 | Discharge: 2012-03-23 | Disposition: A | Discharge status: Home / Self Care | Payer: Self-pay | Attending: Emergency Medicine | Admitting: Emergency Medicine

## 2012-03-23 DIAGNOSIS — Y9269 Other specified industrial and construction area as the place of occurrence of the external cause: Secondary | ICD-10-CM | POA: Insufficient documentation

## 2012-03-23 DIAGNOSIS — IMO0002 Reserved for concepts with insufficient information to code with codable children: Secondary | ICD-10-CM | POA: Insufficient documentation

## 2012-03-23 DIAGNOSIS — Y9389 Activity, other specified: Secondary | ICD-10-CM | POA: Insufficient documentation

## 2012-03-23 DIAGNOSIS — F17200 Nicotine dependence, unspecified, uncomplicated: Secondary | ICD-10-CM | POA: Insufficient documentation

## 2012-03-23 DIAGNOSIS — X503XXA Overexertion from repetitive movements, initial encounter: Secondary | ICD-10-CM | POA: Insufficient documentation

## 2012-03-23 DIAGNOSIS — S46912A Strain of unspecified muscle, fascia and tendon at shoulder and upper arm level, left arm, initial encounter: Secondary | ICD-10-CM

## 2012-03-23 DIAGNOSIS — I10 Essential (primary) hypertension: Secondary | ICD-10-CM | POA: Insufficient documentation

## 2012-03-23 MED ORDER — IBUPROFEN 400 MG PO TABS: 400.0000 mg | ORAL_TABLET | Freq: Once | ORAL | Status: DC

## 2012-03-23 MED ORDER — IBUPROFEN 400 MG PO TABS: 400.0000 mg | ORAL_TABLET | Freq: Four times a day (QID) | ORAL | Status: DC | PRN

## 2012-03-23 NOTE — ED Notes (Signed)
Intermittent left shoulder pain since September 2013.  Pain has been constant since 03/21/12.  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  She thinks she have injured her shoulder while reaching up to pull a box down from a shelf.  She also states her left shoulder is swollen and pain radiates down her arm.

## 2012-03-23 NOTE — ED Provider Notes (Signed)
History     CSN: [id number]  Arrival date & time 03/23/12  0920   First MD Initiated Contact with Patient 03/23/12 [phone]      Chief Complaint  Patient presents with  . Shoulder Pain    (Consider location/radiation/quality/duration/timing/severity/associated sxs/prior treatment) Patient is a 26 y.o. female presenting with shoulder pain. The history is provided by the patient. No language interpreter was used.  Shoulder Pain This is a recurrent problem. Episode onset: The patient has had pain in her left shoulder on and off since September. She feels the swelling in her left trapezius muscle area. She has started a new job in September, and thought initially that this was caused by lifting at work.  Episode frequency: The pain was on and off since September, and seemed to get worse this past Monday, 2 days ago. The problem has been gradually worsening. Associated symptoms comments: None.. Exacerbated by: Lifting objects at work. Nothing relieves the symptoms. She has tried nothing for the symptoms.    Past Medical History  Diagnosis Date  . Hypertension   . Pregnancy induced hypertension     Past Surgical History  Procedure Date  . Induced abortion   . Induced abortion     Family History  Problem Relation Age of Onset  . Other Neg Hx     History  Substance Use Topics  . Smoking status: Current Every Day Smoker  . Smokeless tobacco: Not on file  . Alcohol Use: Yes     Comment: social    OB History    Grav Para Term Preterm Abortions TAB SAB Ect Mult Living   6 4 3 1 2 2    4       Review of Systems  Constitutional: Negative.  Negative for fever and chills.  HENT: Negative.   Eyes: Negative.   Respiratory: Negative.   Cardiovascular: Negative.   Gastrointestinal: Negative.   Genitourinary: Negative.   Musculoskeletal:       Pain in left shoulder.  Skin: Negative.   Neurological: Negative.   Psychiatric/Behavioral: Negative.   All other systems reviewed and  are negative.    Allergies  Review of patient's allergies indicates no known allergies.  Home Medications   Current Outpatient Rx  Name  Route  Sig  Dispense  Refill  . GOODY HEADACHE PO   Oral   Take 1 packet by mouth daily as needed. Patient used this medication for a headache.           BP 121/80  Pulse 65  Temp 98.3 F (36.8 C) (Oral)  Resp 16  Ht 5\' 6"  (1.676 m)  Wt 156 lb (70.761 kg)  BMI 25.18 kg/m2  SpO2 99%  Physical Exam  Nursing note and vitals reviewed. Constitutional: She is oriented to person, place, and time. She appears well-developed and well-nourished.       In moderate distress with pain in the left shoulder.  HENT:  Head: Normocephalic and atraumatic.  Right Ear: External ear normal.  Left Ear: External ear normal.  Mouth/Throat: Oropharynx is clear and moist.  Eyes: Conjunctivae normal and EOM are normal. Pupils are equal, round, and reactive to light.  Neck: Normal range of motion. Neck supple.  Cardiovascular: Normal rate, regular rhythm and normal heart sounds.   Pulmonary/Chest: Effort normal and breath sounds normal.  Abdominal: Soft. Bowel sounds are normal.  Musculoskeletal:       She localizes pain to the left shoulder or trapezius muscle. There is some tenderness  to touch there. She has a full range of motion of her left shoulder were. Elevating her arm causes pain. There is no palpable deformity of her shoulder, humerus, radius and ulna, left wrist or hand. She has intact pulses sensation and tendon function in the left hand.  Neurological: She is alert and oriented to person, place, and time.        No sensory or motor deficit.  Skin: Skin is warm and dry. She is not diaphoretic.       No rash.  Psychiatric: She has a normal mood and affect. Her behavior is normal.    ED Course  Procedures (including critical care time)  10:12 AM Patient was seen and had physical examination. X-rays of the cervical spine and left shoulder were  ordered.  X-rays of C-spine and left shoulder were negative.  Rx with Ibuprofen 400 mg qid.  F/U with Norton Blizzard, M.D. If not improving.   1. Muscle strain of left shoulder         Carleene Cooper III, MD 03/23/12 [phone]

## 2012-03-23 NOTE — ED Notes (Signed)
Patient transported to X-ray 

## 2012-03-23 NOTE — ED Notes (Signed)
Chart reviewed.

## 2012-04-22 ENCOUNTER — Encounter (HOSPITAL_BASED_OUTPATIENT_CLINIC_OR_DEPARTMENT_OTHER): Payer: Self-pay | Admitting: *Deleted

## 2012-04-22 ENCOUNTER — Emergency Department (HOSPITAL_BASED_OUTPATIENT_CLINIC_OR_DEPARTMENT_OTHER): Payer: Self-pay

## 2012-04-22 ENCOUNTER — Emergency Department (HOSPITAL_BASED_OUTPATIENT_CLINIC_OR_DEPARTMENT_OTHER)
Admission: EM | Admit: 2012-04-22 | Discharge: 2012-04-22 | Disposition: A | Discharge status: Home / Self Care | Payer: Self-pay | Attending: Emergency Medicine | Admitting: Emergency Medicine

## 2012-04-22 DIAGNOSIS — B9689 Other specified bacterial agents as the cause of diseases classified elsewhere: Secondary | ICD-10-CM

## 2012-04-22 DIAGNOSIS — R109 Unspecified abdominal pain: Secondary | ICD-10-CM | POA: Insufficient documentation

## 2012-04-22 DIAGNOSIS — R110 Nausea: Secondary | ICD-10-CM | POA: Insufficient documentation

## 2012-04-22 DIAGNOSIS — Z791 Long term (current) use of non-steroidal anti-inflammatories (NSAID): Secondary | ICD-10-CM | POA: Insufficient documentation

## 2012-04-22 DIAGNOSIS — N760 Acute vaginitis: Secondary | ICD-10-CM | POA: Insufficient documentation

## 2012-04-22 DIAGNOSIS — O9989 Other specified diseases and conditions complicating pregnancy, childbirth and the puerperium: Secondary | ICD-10-CM | POA: Insufficient documentation

## 2012-04-22 DIAGNOSIS — O139 Gestational [pregnancy-induced] hypertension without significant proteinuria, unspecified trimester: Secondary | ICD-10-CM | POA: Insufficient documentation

## 2012-04-22 DIAGNOSIS — R102 Pelvic and perineal pain: Secondary | ICD-10-CM

## 2012-04-22 DIAGNOSIS — Z3490 Encounter for supervision of normal pregnancy, unspecified, unspecified trimester: Secondary | ICD-10-CM

## 2012-04-22 DIAGNOSIS — F17200 Nicotine dependence, unspecified, uncomplicated: Secondary | ICD-10-CM | POA: Insufficient documentation

## 2012-04-22 DIAGNOSIS — N949 Unspecified condition associated with female genital organs and menstrual cycle: Secondary | ICD-10-CM | POA: Insufficient documentation

## 2012-04-22 LAB — CBC WITH DIFFERENTIAL/PLATELET
Basophils Relative: 0 % (ref 0–1)
Eosinophils Absolute: 0.1 10*3/uL (ref 0.0–0.7)
Hemoglobin: 11.9 g/dL — ABNORMAL LOW (ref 12.0–15.0)
MCH: 28.1 pg (ref 26.0–34.0)
MCHC: 37.3 g/dL — ABNORMAL HIGH (ref 30.0–36.0)
Monocytes Absolute: 0.6 10*3/uL (ref 0.1–1.0)
Neutrophils Relative %: 59 % (ref 43–77)
Platelets: 148 10*3/uL — ABNORMAL LOW (ref 150–400)

## 2012-04-22 LAB — PREGNANCY, URINE: Preg Test, Ur: POSITIVE — AB

## 2012-04-22 LAB — URINE MICROSCOPIC-ADD ON

## 2012-04-22 LAB — WET PREP, GENITAL

## 2012-04-22 LAB — HCG, QUANTITATIVE, PREGNANCY: hCG, Beta Chain, Quant, S: 30752 m[IU]/mL — ABNORMAL HIGH (ref <5)

## 2012-04-22 LAB — URINALYSIS, ROUTINE W REFLEX MICROSCOPIC
Glucose, UA: NEGATIVE mg/dL
Hgb urine dipstick: NEGATIVE
Specific Gravity, Urine: 1.018 (ref 1.005–1.030)

## 2012-04-22 LAB — ABO/RH: ABO/RH(D): O POS

## 2012-04-22 MED ORDER — METRONIDAZOLE 500 MG PO TABS: 500.0000 mg | ORAL_TABLET | Freq: Two times a day (BID) | ORAL | Status: DC

## 2012-04-22 MED ORDER — ACETAMINOPHEN 325 MG PO TABS
650.0000 mg | ORAL_TABLET | Freq: Once | ORAL | Status: AC
  Administered 2012-04-22: 650 mg via ORAL
  Filled 2012-04-22: qty 2

## 2012-04-22 NOTE — ED Provider Notes (Signed)
History     CSN: [id number]  Arrival date & time 04/22/12  1036   First MD Initiated Contact with Patient 04/22/12 1147      Chief Complaint  Patient presents with  . Abdominal Pain    (Consider location/radiation/quality/duration/timing/severity/associated sxs/prior treatment) Patient is a 27 y.o. female presenting with abdominal pain. The history is provided by the patient.  Abdominal Pain The primary symptoms of the illness include abdominal pain and nausea. The primary symptoms of the illness do not include fever, vomiting, dysuria, vaginal discharge or vaginal bleeding. Episode onset: 3 days ago. The onset of the illness was gradual. The problem has been gradually worsening.  Pregnant now: unknown, late period. The patient has not had a change in bowel habit. Symptoms associated with the illness do not include chills, urgency, hematuria, frequency or back pain.    Past Medical History  Diagnosis Date  . Hypertension   . Pregnancy induced hypertension     Past Surgical History  Procedure Date  . Induced abortion   . Induced abortion     Family History  Problem Relation Age of Onset  . Other Neg Hx     History  Substance Use Topics  . Smoking status: Current Every Day Smoker  . Smokeless tobacco: Not on file  . Alcohol Use: Yes     Comment: social    OB History    Grav Para Term Preterm Abortions TAB SAB Ect Mult Living   6 4 3 1 2 2    4       Review of Systems  Constitutional: Negative for fever and chills.  Gastrointestinal: Positive for nausea and abdominal pain. Negative for vomiting.  Genitourinary: Negative for dysuria, urgency, frequency, hematuria, vaginal bleeding and vaginal discharge.  Musculoskeletal: Negative for back pain.  All other systems reviewed and are negative.    Allergies  Review of patient's allergies indicates no known allergies.  Home Medications   Current Outpatient Rx  Name  Route  Sig  Dispense  Refill  . GOODY  HEADACHE PO   Oral   Take 1 packet by mouth daily as needed. Patient used this medication for a headache.         . IBUPROFEN 400 MG PO TABS   Oral   Take 1 tablet (400 mg total) by mouth every 6 (six) hours as needed for pain.   30 tablet   0     BP 119/71  Pulse 95  Temp 98.3 F (36.8 C) (Oral)  Resp 20  Ht 5\' 6"  (1.676 m)  Wt 150 lb (68.04 kg)  BMI 24.21 kg/m2  SpO2 100%  LMP 03/04/2012  Physical Exam  Nursing note and vitals reviewed. Constitutional: She is oriented to person, place, and time. She appears well-developed and well-nourished. No distress.  HENT:  Head: Normocephalic and atraumatic.  Mouth/Throat: Oropharynx is clear and moist.  Neck: Normal range of motion. Neck supple.  Cardiovascular: Normal rate, regular rhythm and normal heart sounds.   Pulmonary/Chest: Effort normal and breath sounds normal. No respiratory distress.  Abdominal: Soft. Bowel sounds are normal. She exhibits no distension.       There is ttp in the suprapubic region with no rebound or guarding.  Bowel sounds are present.  Genitourinary: Uterus normal. Vaginal discharge found.       Grayish discharge present.  Musculoskeletal: Normal range of motion. She exhibits no edema.  Neurological: She is alert and oriented to person, place, and time.  Skin: Skin  is warm and dry. She is not diaphoretic.    ED Course  Procedures (including critical care time)  Labs Reviewed  URINALYSIS, ROUTINE W REFLEX MICROSCOPIC - Abnormal; Notable for the following:    APPearance CLOUDY (*)     Urobilinogen, UA 2.0 (*)     Leukocytes, UA MODERATE (*)     All other components within normal limits  PREGNANCY, URINE - Abnormal; Notable for the following:    Preg Test, Ur POSITIVE (*)     All other components within normal limits  URINE MICROSCOPIC-ADD ON - Abnormal; Notable for the following:    Squamous Epithelial / LPF FEW (*)     Bacteria, UA MANY (*)     All other components within normal limits    CBC WITH DIFFERENTIAL  HCG, QUANTITATIVE, PREGNANCY  WET PREP, GENITAL  GC/CHLAMYDIA PROBE AMP   No results found.   No diagnosis found.    MDM  The patient presents here with pelvic pain.  Workup reveals a positive pregnancy test and US showing an iup.  The wet prep shows moderate clue cells.  She will be treated with flagyl and I have recommended she call her ob to arrange follow up.        Geoffery Lyons, MD 04/22/12 [phone]

## 2012-04-22 NOTE — ED Notes (Signed)
Patient states she has had lower abdominal pain for the last 2-3 days.  States cramps have worsened over night and is associated with nausea.  States her last LMP was 03/04/12, which is normal for her due to irregular cycles.

## 2012-04-23 LAB — URINE CULTURE: Colony Count: 80000

## 2012-04-24 LAB — GC/CHLAMYDIA PROBE AMP: GC Probe RNA: NEGATIVE

## 2012-04-26 NOTE — ED Notes (Signed)
+  Chlamydia Chart sent to EDP office for review.  

## 2012-04-30 ENCOUNTER — Telehealth (HOSPITAL_COMMUNITY): Payer: Self-pay | Admitting: Emergency Medicine

## 2012-04-30 NOTE — ED Notes (Signed)
Chart returned from EDP office. Prescribed Azithromycin 1 gram PO x 1. #1. No refills. Prescribed by Dahlia Client Muthersbaugh PA-C.

## 2012-08-19 ENCOUNTER — Encounter (HOSPITAL_BASED_OUTPATIENT_CLINIC_OR_DEPARTMENT_OTHER): Payer: Self-pay | Admitting: Family Medicine

## 2012-08-19 ENCOUNTER — Emergency Department (HOSPITAL_BASED_OUTPATIENT_CLINIC_OR_DEPARTMENT_OTHER)
Admission: EM | Admit: 2012-08-19 | Discharge: 2012-08-19 | Disposition: A | Discharge status: Home / Self Care | Payer: Self-pay | Attending: Emergency Medicine | Admitting: Emergency Medicine

## 2012-08-19 DIAGNOSIS — K029 Dental caries, unspecified: Secondary | ICD-10-CM | POA: Insufficient documentation

## 2012-08-19 DIAGNOSIS — K089 Disorder of teeth and supporting structures, unspecified: Secondary | ICD-10-CM | POA: Insufficient documentation

## 2012-08-19 DIAGNOSIS — F17200 Nicotine dependence, unspecified, uncomplicated: Secondary | ICD-10-CM | POA: Insufficient documentation

## 2012-08-19 MED ORDER — BENZOCAINE 20 % MT SOLN
OROMUCOSAL | Status: AC
  Administered 2012-08-19: 15:00:00
  Filled 2012-08-19: qty 57

## 2012-08-19 MED ORDER — IBUPROFEN 800 MG PO TABS: 800.0000 mg | ORAL_TABLET | Freq: Three times a day (TID) | ORAL | Status: DC

## 2012-08-19 MED ORDER — PENICILLIN V POTASSIUM 500 MG PO TABS: 500.0000 mg | ORAL_TABLET | Freq: Three times a day (TID) | ORAL | Status: DC

## 2012-08-19 MED ORDER — HYDROCODONE-ACETAMINOPHEN 5-325 MG PO TABS: 1.0000 | ORAL_TABLET | Freq: Four times a day (QID) | ORAL | Status: DC | PRN

## 2012-08-19 MED ORDER — BUPIVACAINE-EPINEPHRINE (PF) 0.5% -1:200000 IJ SOLN
INTRAMUSCULAR | Status: AC
  Administered 2012-08-19: 15:00:00
  Filled 2012-08-19: qty 1.8

## 2012-08-19 NOTE — ED Notes (Signed)
Pt c/o right lower dental pain. Pt sts she called her dentist and was told she might have an "infection" and advised to go to ED.

## 2012-08-19 NOTE — ED Provider Notes (Signed)
History     CSN: [id number]  Arrival date & time 08/19/12  1317   None     Chief Complaint  Patient presents with  . Dental Pain    (Consider location/radiation/quality/duration/timing/severity/associated sxs/prior treatment) Patient is a 27 y.o. female presenting with tooth pain. The history is provided by the patient. No language interpreter was used.  Dental PainThe primary symptoms include mouth pain. The symptoms began 6 to 12 hours ago. The symptoms are worsening. The symptoms occur constantly.  Additional symptoms include: dental sensitivity to temperature.    Past Medical History  Diagnosis Date  . Hypertension   . Pregnancy induced hypertension     Past Surgical History  Procedure Laterality Date  . Induced abortion    . Induced abortion      Family History  Problem Relation Age of Onset  . Other Neg Hx     History  Substance Use Topics  . Smoking status: Current Every Day Smoker  . Smokeless tobacco: Not on file  . Alcohol Use: Yes     Comment: social    OB History   Grav Para Term Preterm Abortions TAB SAB Ect Mult Living   6 4 3 1 2 2    4       Review of Systems  HENT: Positive for dental problem.   All other systems reviewed and are negative.    Allergies  Review of patient's allergies indicates no known allergies.  Home Medications   Current Outpatient Rx  Name  Route  Sig  Dispense  Refill  . Aspirin-Acetaminophen-Caffeine (GOODY HEADACHE PO)   Oral   Take 1 packet by mouth daily as needed. Patient used this medication for a headache.         Marland Kitchen HYDROcodone-acetaminophen (NORCO/VICODIN) 5-325 MG per tablet   Oral   Take 1 tablet by mouth every 6 (six) hours as needed for pain.   10 tablet   0   . ibuprofen (ADVIL,MOTRIN) 400 MG tablet   Oral   Take 1 tablet (400 mg total) by mouth every 6 (six) hours as needed for pain.   30 tablet   0   . ibuprofen (ADVIL,MOTRIN) 800 MG tablet   Oral   Take 1 tablet (800 mg total) by  mouth 3 (three) times daily.   21 tablet   0   . metroNIDAZOLE (FLAGYL) 500 MG tablet   Oral   Take 1 tablet (500 mg total) by mouth 2 (two) times daily. One po bid x 7 days   14 tablet   0   . penicillin v potassium (VEETID) 500 MG tablet   Oral   Take 1 tablet (500 mg total) by mouth 3 (three) times daily.   30 tablet   0     BP 129/95  Pulse 67  Temp(Src) 98.5 F (36.9 C) (Oral)  Resp 16  Ht 5\' 6"  (1.676 m)  Wt 150 lb (68.04 kg)  BMI 24.22 kg/m2  SpO2 100%  LMP 08/01/2012  Physical Exam  Nursing note and vitals reviewed. Constitutional: She is oriented to person, place, and time. She appears well-developed and well-nourished.  HENT:  Head: Normocephalic and atraumatic.  Mouth/Throat: Dental caries present.    Eyes: Conjunctivae are normal. Pupils are equal, round, and reactive to light.  Cardiovascular: Normal rate, regular rhythm and normal heart sounds.   Pulmonary/Chest: Effort normal and breath sounds normal.  Abdominal: Soft. Bowel sounds are normal.  Musculoskeletal: Normal range of motion.  Neurological:  She is alert and oriented to person, place, and time.  Skin: Skin is warm and dry.  Psychiatric: She has a normal mood and affect. Her behavior is normal. Judgment and thought content normal.    ED Course  Dental Date/Time: 08/19/2012 2:20 PM Performed by: Jimmye Norman Authorized by: Jimmye Norman Consent: Verbal consent obtained. Risks and benefits: risks, benefits and alternatives were discussed Consent given by: patient Patient understanding: patient states understanding of the procedure being performed Local anesthesia used: yes Anesthesia: local infiltration Local anesthetic: bupivacaine 0.5% without epinephrine Anesthetic total: 1 ml Patient tolerance: Patient tolerated the procedure well with no immediate complications.   (including critical care time)  Labs Reviewed - No data to display No results found.   1. Pain due to  dental caries    Patient has contacted her dentist--he will try to see her next week.   MDM          Jimmye Norman, NP 08/19/12 1422

## 2012-08-19 NOTE — ED Provider Notes (Signed)
Medical screening examination/treatment/procedure(s) were performed by non-physician practitioner and as supervising physician I was immediately available for consultation/collaboration.   Nicholas Trompeter B. Jene Huq, MD 08/19/12 1456 

## 2012-11-22 ENCOUNTER — Emergency Department (HOSPITAL_BASED_OUTPATIENT_CLINIC_OR_DEPARTMENT_OTHER): Payer: Medicaid Other

## 2012-11-22 ENCOUNTER — Encounter (HOSPITAL_BASED_OUTPATIENT_CLINIC_OR_DEPARTMENT_OTHER): Payer: Self-pay

## 2012-11-22 ENCOUNTER — Emergency Department (HOSPITAL_BASED_OUTPATIENT_CLINIC_OR_DEPARTMENT_OTHER)
Admission: EM | Admit: 2012-11-22 | Discharge: 2012-11-22 | Disposition: A | Discharge status: Home / Self Care | Payer: Medicaid Other | Attending: Emergency Medicine | Admitting: Emergency Medicine

## 2012-11-22 DIAGNOSIS — O9989 Other specified diseases and conditions complicating pregnancy, childbirth and the puerperium: Secondary | ICD-10-CM | POA: Insufficient documentation

## 2012-11-22 DIAGNOSIS — O98819 Other maternal infectious and parasitic diseases complicating pregnancy, unspecified trimester: Secondary | ICD-10-CM | POA: Insufficient documentation

## 2012-11-22 DIAGNOSIS — R350 Frequency of micturition: Secondary | ICD-10-CM | POA: Insufficient documentation

## 2012-11-22 DIAGNOSIS — A599 Trichomoniasis, unspecified: Secondary | ICD-10-CM

## 2012-11-22 DIAGNOSIS — M549 Dorsalgia, unspecified: Secondary | ICD-10-CM | POA: Insufficient documentation

## 2012-11-22 DIAGNOSIS — O169 Unspecified maternal hypertension, unspecified trimester: Secondary | ICD-10-CM | POA: Insufficient documentation

## 2012-11-22 DIAGNOSIS — R42 Dizziness and giddiness: Secondary | ICD-10-CM | POA: Insufficient documentation

## 2012-11-22 DIAGNOSIS — O99330 Smoking (tobacco) complicating pregnancy, unspecified trimester: Secondary | ICD-10-CM | POA: Insufficient documentation

## 2012-11-22 DIAGNOSIS — Z3490 Encounter for supervision of normal pregnancy, unspecified, unspecified trimester: Secondary | ICD-10-CM

## 2012-11-22 DIAGNOSIS — R51 Headache: Secondary | ICD-10-CM | POA: Insufficient documentation

## 2012-11-22 LAB — URINALYSIS, ROUTINE W REFLEX MICROSCOPIC
Bilirubin Urine: NEGATIVE
Ketones, ur: NEGATIVE mg/dL
Nitrite: NEGATIVE
Protein, ur: NEGATIVE mg/dL
Urobilinogen, UA: 2 mg/dL — ABNORMAL HIGH (ref 0.0–1.0)

## 2012-11-22 LAB — WET PREP, GENITAL

## 2012-11-22 MED ORDER — SODIUM CHLORIDE 0.9 % IV SOLN: 1000.0000 mL | Freq: Once | INTRAVENOUS | Status: DC

## 2012-11-22 MED ORDER — IBUPROFEN 400 MG PO TABS: 600.0000 mg | ORAL_TABLET | Freq: Once | ORAL | Status: DC

## 2012-11-22 MED ORDER — OXYCODONE-ACETAMINOPHEN 5-325 MG PO TABS
1.0000 | ORAL_TABLET | Freq: Once | ORAL | Status: DC
  Filled 2012-11-22: qty 1

## 2012-11-22 MED ORDER — SODIUM CHLORIDE 0.9 % IV SOLN: 1000.0000 mL | INTRAVENOUS | Status: DC

## 2012-11-22 MED ORDER — METRONIDAZOLE 500 MG PO TABS
2000.0000 mg | ORAL_TABLET | Freq: Once | ORAL | Status: AC
  Administered 2012-11-22: 2000 mg via ORAL
  Filled 2012-11-22: qty 4

## 2012-11-22 NOTE — ED Notes (Signed)
C/o headache since last Wed.  Pt states that she has taken ibuprofen, tylenol, hydrocodone, goody powers for her head. Pt states that she has had some dizziness and seeing "white spots," denies other vision changes.  Pt states that her back pain started last night.  Pain location is on her right flank and she reports that it "radiation down" her right outer thigh.  Additional complaint of this pain being associated with lower abdominal pain.  Pt in NAD, AAOx4.

## 2012-11-22 NOTE — ED Notes (Signed)
Pt in US at this time 

## 2012-11-22 NOTE — ED Provider Notes (Signed)
Medical screening examination/treatment/procedure(s) were performed by non-physician practitioner and as supervising physician I was immediately available for consultation/collaboration.  Lyanne Co, MD 11/22/12 2108

## 2012-11-22 NOTE — ED Provider Notes (Signed)
CSN: [id number]     Arrival date & time 11/22/12  1850 History     First MD Initiated Contact with Patient 11/22/12 1856     Chief Complaint  Patient presents with  . Back Pain  . Headache   (Consider location/radiation/quality/duration/timing/severity/associated sxs/prior Treatment) HPI Comments: Pt states that she started with a headache almost a week ago and she is seeing white spots and is dizzy:pt states that she has tried multiple otc without relief:pt states that she has a history of migraine, but they don't usually last this long:pt states that she started having left lower back pain 2 days ago:pt states that she has had urinary frequency for the last couple of days:denies n/vd, fever, or vaginal discharge  Patient is a 27 y.o. female presenting with headaches. The history is provided by the patient. No language interpreter was used.  Headache   Past Medical History  Diagnosis Date  . Hypertension   . Pregnancy induced hypertension    Past Surgical History  Procedure Laterality Date  . Induced abortion    . Induced abortion     Family History  Problem Relation Age of Onset  . Other Neg Hx    History  Substance Use Topics  . Smoking status: Current Every Day Smoker -- 1.00 packs/day    Types: Cigarettes  . Smokeless tobacco: Not on file  . Alcohol Use: Yes     Comment: social   OB History   Grav Para Term Preterm Abortions TAB SAB Ect Mult Living   6 4 3 1 2 2    4      Review of Systems  Constitutional: Negative.   Respiratory: Negative.   Cardiovascular: Negative.   Neurological: Positive for headaches.    Allergies  Review of patient's allergies indicates no known allergies.  Home Medications   Current Outpatient Rx  Name  Route  Sig  Dispense  Refill  . Aspirin-Acetaminophen-Caffeine (GOODY HEADACHE PO)   Oral   Take 1 packet by mouth daily as needed. Patient used this medication for a headache.         Marland Kitchen HYDROcodone-acetaminophen  (NORCO/VICODIN) 5-325 MG per tablet   Oral   Take 1 tablet by mouth every 6 (six) hours as needed for pain.   10 tablet   0   . ibuprofen (ADVIL,MOTRIN) 400 MG tablet   Oral   Take 1 tablet (400 mg total) by mouth every 6 (six) hours as needed for pain.   30 tablet   0   . ibuprofen (ADVIL,MOTRIN) 800 MG tablet   Oral   Take 1 tablet (800 mg total) by mouth 3 (three) times daily.   21 tablet   0   . metroNIDAZOLE (FLAGYL) 500 MG tablet   Oral   Take 1 tablet (500 mg total) by mouth 2 (two) times daily. One po bid x 7 days   14 tablet   0   . penicillin v potassium (VEETID) 500 MG tablet   Oral   Take 1 tablet (500 mg total) by mouth 3 (three) times daily.   30 tablet   0    BP 131/75  Pulse 89  Temp(Src) 98.4 F (36.9 C) (Oral)  Resp 18  Wt 156 lb 11.2 oz (71.079 kg)  BMI 25.3 kg/m2  SpO2 100%  LMP 10/19/2012 Physical Exam  Nursing note and vitals reviewed. Constitutional: She is oriented to person, place, and time. She appears well-developed and well-nourished.  HENT:  Head: Normocephalic and  atraumatic.  Eyes: Conjunctivae and EOM are normal. Pupils are equal, round, and reactive to light.  Cardiovascular: Normal rate and regular rhythm.   Pulmonary/Chest: Effort normal and breath sounds normal.  Abdominal: Soft. Bowel sounds are normal. There is no tenderness.  Musculoskeletal: Normal range of motion.  Neurological: She is alert and oriented to person, place, and time.  Skin: Skin is warm and dry.  Psychiatric: She has a normal mood and affect.    ED Course   Procedures (including critical care time)  Labs Reviewed  WET PREP, GENITAL - Abnormal; Notable for the following:    Trich, Wet Prep FEW (*)    Clue Cells Wet Prep HPF POC MODERATE (*)    WBC, Wet Prep HPF POC MANY (*)    All other components within normal limits  URINALYSIS, ROUTINE W REFLEX MICROSCOPIC - Abnormal; Notable for the following:    APPearance TURBID (*)    Urobilinogen, UA 2.0  (*)    Leukocytes, UA SMALL (*)    All other components within normal limits  PREGNANCY, URINE - Abnormal; Notable for the following:    Preg Test, Ur POSITIVE (*)    All other components within normal limits  URINE MICROSCOPIC-ADD ON - Abnormal; Notable for the following:    Squamous Epithelial / LPF MANY (*)    Bacteria, UA FEW (*)    All other components within normal limits  URINE CULTURE  GC/CHLAMYDIA PROBE AMP   US Ob Comp Less 14 Wks  11/22/2012   *RADIOLOGY REPORT*  Clinical Data: Lower back pain.  Headache.  Positive pregnancy test.  OBSTETRIC <14 WK [place] AND TRANSVAGINAL OB US  Technique:  Both transabdominal and transvaginal ultrasound examinations were performed for complete evaluation of the gestation as well as the maternal uterus, adnexal regions, and pelvic cul-de-sac.  Transvaginal technique was performed to assess early pregnancy.  Comparison:  Mobile ultrasound 04/22/2012.  Intrauterine gestational sac:  Single gestational sac ovoid shaped. Yolk sac: Present. Embryo: Not visualized. Cardiac Activity: Not visualized. Heart Rate: N/A  MSD: 3.9 mm  5 w 1 d          [place] EDC: 07/24/2012  Maternal uterus/adnexae: Right ovary measures 3.5 x 2.2 x 2.6 cm and contains a thick-walled cystic lesion likely represent a degenerating corpus luteum.  Left ovary is normal in echotexture and appearance measuring 2.5 x 3.1 x 3.3 cm and contains multiple small follicles.  No significant free fluid the cul-de-sac.  IMPRESSION: 1.  Single early IUP with a mean sac diameter of 3.9 mm indicating an estimated gestational age of [redacted] weeks and 1 day.  No embryo or cardiac activity identified at this time.   Original Report Authenticated By: Trudie Reed, M.D.   US Ob Transvaginal  11/22/2012   *RADIOLOGY REPORT*  Clinical Data: Lower back pain.  Headache.  Positive pregnancy test.  OBSTETRIC <14 WK [place] AND TRANSVAGINAL OB US  Technique:  Both transabdominal and transvaginal ultrasound examinations were performed  for complete evaluation of the gestation as well as the maternal uterus, adnexal regions, and pelvic cul-de-sac.  Transvaginal technique was performed to assess early pregnancy.  Comparison:  Mobile ultrasound 04/22/2012.  Intrauterine gestational sac:  Single gestational sac ovoid shaped. Yolk sac: Present. Embryo: Not visualized. Cardiac Activity: Not visualized. Heart Rate: N/A  MSD: 3.9 mm  5 w 1 d          [place] EDC: 07/24/2012  Maternal uterus/adnexae: Right ovary measures 3.5 x 2.2 x 2.6  cm and contains a thick-walled cystic lesion likely represent a degenerating corpus luteum.  Left ovary is normal in echotexture and appearance measuring 2.5 x 3.1 x 3.3 cm and contains multiple small follicles.  No significant free fluid the cul-de-sac.  IMPRESSION: 1.  Single early IUP with a mean sac diameter of 3.9 mm indicating an estimated gestational age of [redacted] weeks and 1 day.  No embryo or cardiac activity identified at this time.   Original Report Authenticated By: Trudie Reed, M.D.   1. Pregnancy   2. Trichimoniasis     MDM  Pt to follow up with dr dorn:pt has not multiple pregnancies:pregnancy is iup:pt treated for trich and culture for std and urine sent  Teressa Lower, NP 11/22/12 [phone]

## 2012-11-22 NOTE — ED Notes (Signed)
PA at bedside.

## 2012-11-23 ENCOUNTER — Telehealth (HOSPITAL_COMMUNITY): Payer: Self-pay | Admitting: Emergency Medicine

## 2012-11-23 LAB — URINE CULTURE: Colony Count: 40000

## 2012-11-25 ENCOUNTER — Encounter (HOSPITAL_BASED_OUTPATIENT_CLINIC_OR_DEPARTMENT_OTHER): Payer: Self-pay

## 2012-11-25 ENCOUNTER — Emergency Department (HOSPITAL_BASED_OUTPATIENT_CLINIC_OR_DEPARTMENT_OTHER): Payer: Medicaid Other

## 2012-11-25 ENCOUNTER — Emergency Department (HOSPITAL_BASED_OUTPATIENT_CLINIC_OR_DEPARTMENT_OTHER)
Admission: EM | Admit: 2012-11-25 | Discharge: 2012-11-25 | Disposition: A | Discharge status: Home / Self Care | Payer: Medicaid Other | Attending: Emergency Medicine | Admitting: Emergency Medicine

## 2012-11-25 DIAGNOSIS — X500XXA Overexertion from strenuous movement or load, initial encounter: Secondary | ICD-10-CM | POA: Insufficient documentation

## 2012-11-25 DIAGNOSIS — Y9389 Activity, other specified: Secondary | ICD-10-CM | POA: Insufficient documentation

## 2012-11-25 DIAGNOSIS — S93402A Sprain of unspecified ligament of left ankle, initial encounter: Secondary | ICD-10-CM

## 2012-11-25 DIAGNOSIS — S93409A Sprain of unspecified ligament of unspecified ankle, initial encounter: Secondary | ICD-10-CM | POA: Insufficient documentation

## 2012-11-25 DIAGNOSIS — O169 Unspecified maternal hypertension, unspecified trimester: Secondary | ICD-10-CM | POA: Insufficient documentation

## 2012-11-25 DIAGNOSIS — O99330 Smoking (tobacco) complicating pregnancy, unspecified trimester: Secondary | ICD-10-CM | POA: Insufficient documentation

## 2012-11-25 DIAGNOSIS — O9989 Other specified diseases and conditions complicating pregnancy, childbirth and the puerperium: Secondary | ICD-10-CM | POA: Insufficient documentation

## 2012-11-25 DIAGNOSIS — Y92009 Unspecified place in unspecified non-institutional (private) residence as the place of occurrence of the external cause: Secondary | ICD-10-CM | POA: Insufficient documentation

## 2012-11-25 NOTE — ED Notes (Signed)
Patient transported to X-ray 

## 2012-11-25 NOTE — ED Notes (Signed)
C/o left ankle pain.  Pt states that she was sweeping and "stepped wrong."  Pt is ambulatory but the pain "sends it throughout my body" when she applies weight.  Pt in NAD, AAOx4.

## 2012-11-25 NOTE — ED Provider Notes (Signed)
CSN: [id number]     Arrival date & time 11/25/12  1117 History     First MD Initiated Contact with Patient 11/25/12 1131     Chief Complaint  Patient presents with  . Ankle Pain   (Consider location/radiation/quality/duration/timing/severity/associated sxs/prior Treatment) Patient is a 27 y.o. female presenting with ankle pain.  Ankle Pain  Pt reports she stumbled and fell while sweeping in her house last night twisting her L ankle, complaining of moderate sharp pain worse with movement. She is approx 6 weeks preg, recent US done here confirmed IUP, no Abd pain or vaginal bleeding.   Past Medical History  Diagnosis Date  . Hypertension   . Pregnancy induced hypertension    Past Surgical History  Procedure Laterality Date  . Induced abortion    . Induced abortion     Family History  Problem Relation Age of Onset  . Other Neg Hx    History  Substance Use Topics  . Smoking status: Current Every Day Smoker -- 1.00 packs/day    Types: Cigarettes  . Smokeless tobacco: Not on file  . Alcohol Use: Yes     Comment: social   OB History   Grav Para Term Preterm Abortions TAB SAB Ect Mult Living   7 4 3 1 2 2    4      Review of Systems All other systems reviewed and are negative except as noted in HPI.   Allergies  Review of patient's allergies indicates no known allergies.  Home Medications   Current Outpatient Rx  Name  Route  Sig  Dispense  Refill  . Aspirin-Acetaminophen-Caffeine (GOODY HEADACHE PO)   Oral   Take 1 packet by mouth daily as needed. Patient used this medication for a headache.         Marland Kitchen HYDROcodone-acetaminophen (NORCO/VICODIN) 5-325 MG per tablet   Oral   Take 1 tablet by mouth every 6 (six) hours as needed for pain.   10 tablet   0   . ibuprofen (ADVIL,MOTRIN) 400 MG tablet   Oral   Take 1 tablet (400 mg total) by mouth every 6 (six) hours as needed for pain.   30 tablet   0   . ibuprofen (ADVIL,MOTRIN) 800 MG tablet   Oral   Take 1  tablet (800 mg total) by mouth 3 (three) times daily.   21 tablet   0   . metroNIDAZOLE (FLAGYL) 500 MG tablet   Oral   Take 1 tablet (500 mg total) by mouth 2 (two) times daily. One po bid x 7 days   14 tablet   0   . penicillin v potassium (VEETID) 500 MG tablet   Oral   Take 1 tablet (500 mg total) by mouth 3 (three) times daily.   30 tablet   0    BP 124/75  Pulse 75  Temp(Src) 98.3 F (36.8 C)  Resp 18  SpO2 100%  LMP 10/19/2012 Physical Exam  Nursing note and vitals reviewed. Constitutional: She is oriented to person, place, and time. She appears well-developed and well-nourished.  HENT:  Head: Normocephalic and atraumatic.  Eyes: EOM are normal. Pupils are equal, round, and reactive to light.  Neck: Normal range of motion. Neck supple.  Cardiovascular: Normal rate, normal heart sounds and intact distal pulses.   Pulmonary/Chest: Effort normal and breath sounds normal.  Abdominal: Bowel sounds are normal. She exhibits no distension. There is no tenderness.  Musculoskeletal: Normal range of motion. She exhibits tenderness (tender  over the L lateral malelous, no swelling deformity or ecchymosis). She exhibits no edema.  Neurological: She is alert and oriented to person, place, and time. She has normal strength. No cranial nerve deficit or sensory deficit.  Skin: Skin is warm and dry. No rash noted.  Psychiatric: She has a normal mood and affect.    ED Course   Procedures (including critical care time)  Labs Reviewed - No data to display Dg Ankle Complete Left  11/25/2012   *RADIOLOGY REPORT*  Clinical Data: Pain post trauma  LEFT ANKLE COMPLETE - 3+ VIEW  Comparison: None.  Findings:  Frontal, oblique, and lateral views were obtained. There is no fracture or effusion.  Ankle mortise appears intact. No erosive change.  IMPRESSION: No abnormality noted.   Original Report Authenticated By: Bretta Bang, M.D.   1. Ankle sprain, left, initial encounter     MDM   Xray neg, ASO, crutches, pt declines narcotic pain meds. ADvised APAP, RICE. PCP followup.   Charles B. Bernette Mayers, MD 11/25/12 1213

## 2012-11-26 ENCOUNTER — Inpatient Hospital Stay (HOSPITAL_COMMUNITY)
Admission: AD | Admit: 2012-11-26 | Discharge: 2012-11-26 | Disposition: A | Discharge status: Home / Self Care | Payer: Medicaid Other | Source: Ambulatory Visit | Attending: Obstetrics & Gynecology | Admitting: Obstetrics & Gynecology

## 2012-11-26 ENCOUNTER — Encounter (HOSPITAL_COMMUNITY): Payer: Self-pay | Admitting: *Deleted

## 2012-11-26 DIAGNOSIS — R109 Unspecified abdominal pain: Secondary | ICD-10-CM | POA: Insufficient documentation

## 2012-11-26 DIAGNOSIS — O26851 Spotting complicating pregnancy, first trimester: Secondary | ICD-10-CM

## 2012-11-26 DIAGNOSIS — O26859 Spotting complicating pregnancy, unspecified trimester: Secondary | ICD-10-CM

## 2012-11-26 HISTORY — DX: Headache: R51

## 2012-11-26 HISTORY — DX: Anemia, unspecified: D64.9

## 2012-11-26 HISTORY — DX: Depression, unspecified: F32.A

## 2012-11-26 HISTORY — DX: Major depressive disorder, single episode, unspecified: F32.9

## 2012-11-26 LAB — URINE MICROSCOPIC-ADD ON

## 2012-11-26 LAB — URINALYSIS, ROUTINE W REFLEX MICROSCOPIC
Glucose, UA: NEGATIVE mg/dL
Ketones, ur: NEGATIVE mg/dL
Leukocytes, UA: NEGATIVE
Protein, ur: NEGATIVE mg/dL
Urobilinogen, UA: 0.2 mg/dL (ref 0.0–1.0)

## 2012-11-26 LAB — CBC
Hemoglobin: 12.9 g/dL (ref 12.0–15.0)
Platelets: 134 10*3/uL — ABNORMAL LOW (ref 150–400)
RBC: 4.69 MIL/uL (ref 3.87–5.11)
WBC: 6.2 10*3/uL (ref 4.0–10.5)

## 2012-11-26 NOTE — MAU Provider Note (Signed)
History     CSN: [id number]  Arrival date and time: 11/26/12 1517   First Provider Initiated Contact with Patient 11/26/12 1615      Chief Complaint  Patient presents with  . Vaginal Bleeding   HPI [facility] 27 y.o. [redacted]w[redacted]d Comes to MAU with pink vaginal bleeding when she wipes since last night.  Is not wearing a pad.  Denies having intercourse.  Was seen at [facility] ER on 11-22-12 and pelvic exam was done then.  Had trichomonas and was treated at [facility].  Having some lower abdominal discomfort.  Has a history of preterm births and is on 17P with her pregnancies.  OB History   Grav Para Term Preterm Abortions TAB SAB Ect Mult Living   10 4 3 1 5 5    4       Past Medical History  Diagnosis Date  . Hypertension   . Pregnancy induced hypertension   . Anemia   . Headache(784.0)   . Depression     Past Surgical History  Procedure Laterality Date  . Induced abortion    . Induced abortion    . Dilation and curettage of uterus      Family History  Problem Relation Age of Onset  . Other Neg Hx   . Hypertension Mother   . Hypertension Father   . Diabetes Maternal Aunt     History  Substance Use Topics  . Smoking status: Current Every Day Smoker -- 1.00 packs/day    Types: Cigarettes  . Smokeless tobacco: Never Used  . Alcohol Use: Yes     Comment: social    Allergies: No Known Allergies  Prescriptions prior to admission  Medication Sig Dispense Refill  . acetaminophen (TYLENOL) 500 MG tablet Take 1,000 mg by mouth every 6 (six) hours as needed for pain.        ROS Physical Exam   Blood pressure 138/89, pulse 103, temperature 97.9 F (36.6 C), temperature source Oral, height 5\' 7"  (1.702 m), weight 154 lb (69.854 kg), last menstrual period 10/19/2012.  Physical Exam  Nursing note and vitals reviewed. Constitutional: She is oriented to person, place, and time. She appears well-developed and well-nourished.  HENT:  Head: Normocephalic.  Eyes: EOM are normal.   Neck: Neck supple.  GI: Soft. There is tenderness. There is no rebound and no guarding.  Very mild tenderness in RLQ  Musculoskeletal: Normal range of motion.  Neurological: She is alert and oriented to person, place, and time.  Skin: Skin is warm and dry.  Psychiatric: She has a normal mood and affect.    MAU Course  Procedures Results for orders placed during the hospital encounter of 11/26/12 (from the past 24 hour(s))  URINALYSIS, ROUTINE W REFLEX MICROSCOPIC     Status: Abnormal   Collection Time    11/26/12  3:30 PM      Result Value Range   Color, Urine YELLOW  YELLOW   APPearance CLEAR  CLEAR   Specific Gravity, Urine 1.025  1.005 - 1.030   pH 6.0  5.0 - 8.0   Glucose, UA NEGATIVE  NEGATIVE mg/dL   Hgb urine dipstick LARGE (*) NEGATIVE   Bilirubin Urine NEGATIVE  NEGATIVE   Ketones, ur NEGATIVE  NEGATIVE mg/dL   Protein, ur NEGATIVE  NEGATIVE mg/dL   Urobilinogen, UA 0.2  0.0 - 1.0 mg/dL   Nitrite NEGATIVE  NEGATIVE   Leukocytes, UA NEGATIVE  NEGATIVE  URINE MICROSCOPIC-ADD ON     Status: Abnormal  Collection Time    11/26/12  3:30 PM      Result Value Range   Squamous Epithelial / LPF FEW (*) RARE   WBC, UA 3-6  <3 WBC/hpf   RBC / HPF 3-6  <3 RBC/hpf   Bacteria, UA RARE  RARE   Urine-Other MUCOUS PRESENT     Blood type is O positive.  GC/Chlam were negative at visit on 11-22-12.  MDM *RADIOLOGY REPORT*  From 11-22-12. Clinical Data: Lower back pain. Headache. Positive pregnancy  test.  OBSTETRIC <14 WK [place] AND TRANSVAGINAL OB US  Technique: Both transabdominal and transvaginal ultrasound  examinations were performed for complete evaluation of the  gestation as well as the maternal uterus, adnexal regions, and  pelvic cul-de-sac. Transvaginal technique was performed to assess  early pregnancy.  Comparison: Mobile ultrasound 04/22/2012.  Intrauterine gestational sac: Single gestational sac ovoid shaped.  Yolk sac: Present.  Embryo: Not visualized.  Cardiac  Activity: Not visualized.  Heart Rate: N/A  MSD: 3.9 mm 5 w 1 d [place] EDC: 07/24/2012  Maternal uterus/adnexae:  Right ovary measures 3.5 x 2.2 x 2.6 cm and contains a thick-walled  cystic lesion likely represent a degenerating corpus luteum. Left  ovary is normal in echotexture and appearance measuring 2.5 x 3.1 x  3.3 cm and contains multiple small follicles. No significant free  fluid the cul-de-sac.  IMPRESSION:  1. Single early IUP with a mean sac diameter of 3.9 mm indicating  an estimated gestational age of [redacted] weeks and 1 day. No embryo or  cardiac activity identified at this time.    Assessment and Plan  Spotting in pregnancy  Plan Will send message to [facility] to be scheduled for prenatal care as client has previously taken 17P. Urine culture pending. Will consider repeat ultrasound in 10 days if bleeding continues, not indicated today as had [place] on 11-22-12. No smoking, no drugs, no alcohol.  Take a prenatal vitamin one by mouth every day.  Eat small frequent snacks to avoid nausea.    BURLESON,TERRI 11/26/2012, 4:28 PM

## 2012-11-26 NOTE — MAU Note (Signed)
Pt presents with complaints of vaginal bleeding and was told at med center in [facility] that her pregnancy test was positive on Thursday August the 5th when she went there to be evaluated for her ankle.

## 2012-11-26 NOTE — MAU Note (Signed)
Pt states here for bleeding like the beginning of a cycle, no clots noted. Spotting began last pm, no intercourse for 2 weeks. Denies pain at present.

## 2012-11-26 NOTE — MAU Provider Note (Signed)
Attestation of Attending Supervision of Advanced Practitioner (CNM/NP): Evaluation and management procedures were performed by the Advanced Practitioner under my supervision and collaboration.  I have reviewed the Advanced Practitioner's note and chart, and I agree with the management and plan.  HARRAWAY-SMITH, Aniyia Rane 5:47 PM     

## 2012-11-28 LAB — URINE CULTURE

## 2012-11-29 ENCOUNTER — Telehealth: Payer: Self-pay | Admitting: General Practice

## 2012-11-29 ENCOUNTER — Other Ambulatory Visit: Payer: Self-pay

## 2012-11-29 DIAGNOSIS — Z9189 Other specified personal risk factors, not elsewhere classified: Secondary | ICD-10-CM

## 2012-11-29 NOTE — Telephone Encounter (Signed)
Message copied by Kathee Delton on Tue Nov 29, 2012  3:06 PM ------      Message from: Willodean Rosenthal      Created: Mon Nov 28, 2012 12:16 PM       Please call pt.  She needs a repeat urine culture. (wsa in the MAU 11/26/12) and had contaminated cx            Thx,            clh-S ------

## 2012-11-29 NOTE — Telephone Encounter (Signed)
Called patient and informed her of need for repeat urine culture. Patient stated she is just right up the road and will come in in a couple of minutes. Patient also asked if she could change her appt on the 8/28 because she has school. Told patient when she comes by to see [place] shortly the front office staff can help her change that appt. Patient verbalized understanding to all and had no further questions

## 2012-12-01 ENCOUNTER — Inpatient Hospital Stay (HOSPITAL_COMMUNITY): Payer: Medicaid Other

## 2012-12-01 ENCOUNTER — Inpatient Hospital Stay (HOSPITAL_COMMUNITY)
Admission: AD | Admit: 2012-12-01 | Discharge: 2012-12-01 | Disposition: A | Discharge status: Home / Self Care | Payer: Medicaid Other | Source: Ambulatory Visit | Attending: Family Medicine | Admitting: Family Medicine

## 2012-12-01 ENCOUNTER — Encounter (HOSPITAL_COMMUNITY): Payer: Self-pay | Admitting: *Deleted

## 2012-12-01 DIAGNOSIS — O209 Hemorrhage in early pregnancy, unspecified: Secondary | ICD-10-CM | POA: Insufficient documentation

## 2012-12-01 DIAGNOSIS — N9089 Other specified noninflammatory disorders of vulva and perineum: Secondary | ICD-10-CM

## 2012-12-01 DIAGNOSIS — N909 Noninflammatory disorder of vulva and perineum, unspecified: Secondary | ICD-10-CM | POA: Insufficient documentation

## 2012-12-01 LAB — URINALYSIS, ROUTINE W REFLEX MICROSCOPIC
Glucose, UA: NEGATIVE mg/dL
Leukocytes, UA: NEGATIVE
Specific Gravity, Urine: >1.03 — ABNORMAL HIGH (ref 1.005–1.030)
pH: 6 (ref 5.0–8.0)

## 2012-12-01 LAB — URINE MICROSCOPIC-ADD ON

## 2012-12-01 LAB — WET PREP, GENITAL: Clue Cells Wet Prep HPF POC: NONE SEEN

## 2012-12-01 MED ORDER — ACYCLOVIR 400 MG PO TABS: 400.0000 mg | ORAL_TABLET | Freq: Three times a day (TID) | ORAL | Status: DC

## 2012-12-01 MED ORDER — ACYCLOVIR 400 MG PO TABS: 400.0000 mg | ORAL_TABLET | Freq: Three times a day (TID) | ORAL | Status: AC

## 2012-12-01 NOTE — MAU Provider Note (Signed)
Chief Complaint: No chief complaint on file.   First Provider Initiated Contact with Patient 12/01/12 2055     SUBJECTIVE HPI: Alison Coffey is a 27 y.o. [id number] at [redacted]w[redacted]d by LMP who presents with vaginal bleeding x5 days. Bleeding was as heavy as a period the first day, but a regular brown spotting since then. Also reports area of burning and irritation on left labia times several days. No history of similar problems. Ultrasound at MAU 11/22/2012 showed gestational sac with yolk sac. No fetal pole. Wet Prep at that visit was positive for trichomonas. Patient was treated. Informed her partner. Does not know he has been treated, but has not had intercourse with him again. Denies fever, chills, abdominal pain, passage of clots or tissue or vaginal discharge.   Past Medical History  Diagnosis Date  . Hypertension   . Pregnancy induced hypertension   . Anemia   . Headache(784.0)   . Depression    OB History  Gravida Para Term Preterm AB SAB TAB Ectopic Multiple Living  10 4 3 1 5  5   4     # Outcome Date GA Lbr Len/2nd Weight Sex Delivery Anes PTL Lv  10 CUR           9 TRM 12/14/09    F SVD   Y  8 TRM 12/09/07    F SVD   Y  7 TRM 08/24/05    M SVD   Y  6 PRE 07/11/04 [redacted]w[redacted]d   F SVD  Y Y  5 TAB           4 TAB           3 TAB           2 TAB           1 TAB              Past Surgical History  Procedure Laterality Date  . Induced abortion    . Induced abortion    . Dilation and curettage of uterus     History   Social History  . Marital Status: Single    Spouse Name: N/A    Number of Children: N/A  . Years of Education: N/A   Occupational History  . Not on file.   Social History Main Topics  . Smoking status: Current Every Day Smoker -- 1.00 packs/day    Types: Cigarettes  . Smokeless tobacco: Never Used  . Alcohol Use: Yes     Comment: social  . Drug Use: No  . Sexual Activity: Yes    Birth Control/ Protection: None   Other Topics Concern  . Not on file    Social History Narrative  . No narrative on file   No current facility-administered medications on file prior to encounter.   Current Outpatient Prescriptions on File Prior to Encounter  Medication Sig Dispense Refill  . acetaminophen (TYLENOL) 500 MG tablet Take 1,000 mg by mouth every 6 (six) hours as needed for pain.       No Known Allergies  ROS: Pertinent items in HPI  OBJECTIVE Blood pressure 116/72, pulse 79, temperature 98.4 F (36.9 C), temperature source Oral, resp. rate 20, height 5\' 5"  (1.651 m), weight 71.782 kg (158 lb 4 oz), last menstrual period 10/19/2012. GENERAL: Well-developed, well-nourished female in no acute distress.  HEENT: Normocephalic HEART: normal rate RESP: normal effort ABDOMEN: Soft, non-tender EXTREMITIES: Nontender, no edema NEURO: Alert and oriented SPECULUM EXAM: NEFG except  for 4 2 mm round, tender, moist, open lesions with erythematous borders on her lower left labia majora. Physiologic discharge, scant brown blood noted, cervix clean BIMANUAL: cervix closed; uterus slightly enlarged, no adnexal tenderness or masses  LAB RESULTS Results for orders placed during the hospital encounter of 12/01/12 (from the past 24 hour(s))  URINALYSIS, ROUTINE W REFLEX MICROSCOPIC     Status: Abnormal   Collection Time    12/01/12  8:14 PM      Result Value Range   Color, Urine YELLOW  YELLOW   APPearance CLEAR  CLEAR   Specific Gravity, Urine >1.030 (*) 1.005 - 1.030   pH 6.0  5.0 - 8.0   Glucose, UA NEGATIVE  NEGATIVE mg/dL   Hgb urine dipstick MODERATE (*) NEGATIVE   Bilirubin Urine NEGATIVE  NEGATIVE   Ketones, ur NEGATIVE  NEGATIVE mg/dL   Protein, ur NEGATIVE  NEGATIVE mg/dL   Urobilinogen, UA 1.0  0.0 - 1.0 mg/dL   Nitrite NEGATIVE  NEGATIVE   Leukocytes, UA NEGATIVE  NEGATIVE  URINE MICROSCOPIC-ADD ON     Status: Abnormal   Collection Time    12/01/12  8:14 PM      Result Value Range   Squamous Epithelial / LPF MANY (*) RARE   WBC, UA  0-2  <3 WBC/hpf   RBC / HPF 3-6  <3 RBC/hpf   Bacteria, UA FEW (*) RARE   Urine-Other MUCOUS PRESENT    WET PREP, GENITAL     Status: Abnormal   Collection Time    12/01/12 10:30 PM      Result Value Range   Yeast Wet Prep HPF POC NONE SEEN  NONE SEEN   Trich, Wet Prep NONE SEEN  NONE SEEN   Clue Cells Wet Prep HPF POC NONE SEEN  NONE SEEN   WBC, Wet Prep HPF POC FEW (*) NONE SEEN    IMAGING US Ob Transvaginal  12/01/2012   *RADIOLOGY REPORT*  Clinical Data: Vaginal bleeding, positive pregnancy test  TRANSVAGINAL OBSTETRIC US  Technique:  Transvaginal ultrasound was performed for complete evaluation of the gestation as well as the maternal uterus, adnexal regions, and pelvic cul-de-sac.  Comparison:  11/22/2012  Intrauterine gestational sac: Visualized/normal in shape. Yolk sac: Visualized Embryo: Visualized Cardiac Activity: Visualized Heart Rate: 116  CRL: 3mm           5   w  6   d           [place] EDC: 07/28/13  Maternal uterus/adnexae: The ovaries are normal.  IMPRESSION: Intrauterine gestational sac, yolk sac, fetal pole, and cardiac activity.  Concordant dating by crown-rump length compared to assigned gestational age of [redacted] weeks 1 day by LMP.  No acute abnormality.   Original Report Authenticated By: Christiana Pellant, M.D.   MAU COURSE  HSV culture and HSV 1 and 2 IgG and IgM serum antibody collected.  ASSESSMENT 1. First trimester bleeding   2. Vulvar lesion    PLAN Discharge home in stable condition. Bleeding precautions. Pelvic rest x1 week. Did not have intercourse with partner until at least one week after he has been treated. No intercourse until lesions have healed.  Lengthy conversation with patient that lesions may be herpes. Discussed importance of knowing herpes status particularly in pregnancy and need for antiviral medications to help reduce risk of transmission to baby.      Follow-up Information   Follow up with [facility].      Follow up with  [facility]  [facility]. (As needed in emergencies)    Contact information:   [address] [id number] [place] [address] [phone]       Medication List         acetaminophen 500 MG tablet  Commonly known as:  TYLENOL  Take 1,000 mg by mouth every 6 (six) hours as needed for pain.     acyclovir 400 MG tablet  Commonly known as:  ZOVIRAX  Take 1 tablet (400 mg total) by mouth 3 (three) times daily. Times 7-10 days for first outbreak. Times 5 days for subsequent outbreaks.       [place], CNM 12/01/2012  11:03 PM

## 2012-12-01 NOTE — MAU Note (Signed)
PT SAYS SHE WAS IN MAU ON SAT-    SHE WAS BLEEDING.     SAYS BACK HURTS- HAS SINCE Saturday.   STOPPED BLEEDING ON Sunday,   ONLY WHEN SHE WIPED  FOR Monday.     Tuesday NIGHT -   PINKISH- RED  BLEEDING.     THEN WED-   SPOTTED THEN  NO BLOOD. THEN TODAY SPOTTING ON PANTYLINER..    THEN ONLY WHEN SHE WIPED.  NOW IN TRIAGE -  NO PAD - NO BLEEDING.  Marland Kitchen

## 2012-12-02 ENCOUNTER — Telehealth (HOSPITAL_COMMUNITY): Payer: Self-pay | Admitting: Emergency Medicine

## 2012-12-02 LAB — HSV(HERPES SMPLX)ABS-I+II(IGG+IGM)-BLD
HSV 1 Glycoprotein G Ab, IgG: 5.81 IV — ABNORMAL HIGH
HSV 2 Glycoprotein G Ab, IgG: 6.98 IV — ABNORMAL HIGH
Herpes Simplex Vrs I&II-IgM Ab (EIA): 1.19 INDEX — ABNORMAL HIGH

## 2012-12-02 LAB — GC/CHLAMYDIA PROBE AMP: GC Probe RNA: NEGATIVE

## 2012-12-02 NOTE — MAU Provider Note (Signed)
Chart reviewed and agree with management and plan.  

## 2012-12-02 NOTE — Telephone Encounter (Signed)
Pt call to get results from her herpes test and abscess wound culture. 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 Pt received treatment for all the above per protocol.

## 2012-12-03 ENCOUNTER — Telehealth (HOSPITAL_COMMUNITY): Payer: Self-pay | Admitting: *Deleted

## 2012-12-04 ENCOUNTER — Telehealth (HOSPITAL_COMMUNITY): Payer: Self-pay

## 2012-12-05 ENCOUNTER — Telehealth (HOSPITAL_COMMUNITY): Payer: Self-pay | Admitting: Advanced Practice Midwife

## 2012-12-05 LAB — HERPES SIMPLEX VIRUS CULTURE: Culture: NOT DETECTED

## 2012-12-05 NOTE — Telephone Encounter (Signed)
Patient called to check on her culture results.  Informed patient of positive Herpes Simplex Vrs I&II-IgM Ab.  Patient is no longer having any symptoms and did not pick up her prescriptions.  Instructed patient to pick up her Acyclovir Rx to have for any future outbreaks.  Patient will follow-up with her prenatal provider to discuss all therapies to ensure a vaginal delivery.

## 2012-12-15 ENCOUNTER — Other Ambulatory Visit: Payer: Self-pay | Admitting: Obstetrics & Gynecology

## 2012-12-15 ENCOUNTER — Ambulatory Visit (INDEPENDENT_AMBULATORY_CARE_PROVIDER_SITE_OTHER): Payer: Medicaid Other | Admitting: Obstetrics & Gynecology

## 2012-12-15 ENCOUNTER — Ambulatory Visit (HOSPITAL_COMMUNITY)
Admission: RE | Admit: 2012-12-15 | Discharge: 2012-12-15 | Disposition: A | Discharge status: Home / Self Care | Payer: Medicaid Other | Source: Ambulatory Visit | Attending: Obstetrics & Gynecology | Admitting: Obstetrics & Gynecology

## 2012-12-15 ENCOUNTER — Encounter: Payer: Self-pay | Admitting: Obstetrics & Gynecology

## 2012-12-15 VITALS — BP 137/87 | Temp 98.8°F | Wt 162.4 lb

## 2012-12-15 DIAGNOSIS — O169 Unspecified maternal hypertension, unspecified trimester: Secondary | ICD-10-CM

## 2012-12-15 DIAGNOSIS — O368390 Maternal care for abnormalities of the fetal heart rate or rhythm, unspecified trimester, not applicable or unspecified: Secondary | ICD-10-CM | POA: Insufficient documentation

## 2012-12-15 DIAGNOSIS — O09219 Supervision of pregnancy with history of pre-term labor, unspecified trimester: Secondary | ICD-10-CM

## 2012-12-15 DIAGNOSIS — Z8619 Personal history of other infectious and parasitic diseases: Secondary | ICD-10-CM

## 2012-12-15 DIAGNOSIS — O10019 Pre-existing essential hypertension complicating pregnancy, unspecified trimester: Secondary | ICD-10-CM

## 2012-12-15 DIAGNOSIS — O0991 Supervision of high risk pregnancy, unspecified, first trimester: Secondary | ICD-10-CM | POA: Insufficient documentation

## 2012-12-15 DIAGNOSIS — Z3689 Encounter for other specified antenatal screening: Secondary | ICD-10-CM | POA: Insufficient documentation

## 2012-12-15 DIAGNOSIS — O161 Unspecified maternal hypertension, first trimester: Secondary | ICD-10-CM

## 2012-12-15 LAB — POCT URINALYSIS DIP (DEVICE)
Hgb urine dipstick: NEGATIVE
Ketones, ur: NEGATIVE mg/dL
Protein, ur: NEGATIVE mg/dL
Specific Gravity, Urine: 1.025 (ref 1.005–1.030)
pH: 7 (ref 5.0–8.0)

## 2012-12-15 NOTE — Progress Notes (Signed)
Pulse- 87  Pain-abdominal pain Weight gain 25-35lbs New ob packet given

## 2012-12-15 NOTE — Progress Notes (Signed)
First Screen scheduled with MFM on 01/17/13 at 315 pm. ROI signed for Dr. Shawnie Pons in [facility] for release of last pap smear.

## 2012-12-15 NOTE — Progress Notes (Signed)
Baseline labs for [facility]  Subjective:    Alison Coffey is a [id number] [redacted]w[redacted]d being seen today for her first obstetrical visit.  Her obstetrical history is significant for smoker and history of preterm delivery, chtn, bipolar off meds. Patient does intend to breast feed. Pregnancy history fully reviewed.  Patient reports bleeding.  Filed Vitals:   12/15/12 0958  BP: 137/87  Temp: 98.8 F (37.1 C)  Weight: 162 lb 6.4 oz (73.664 kg)    HISTORY: OB History  Gravida Para Term Preterm AB SAB TAB Ectopic Multiple Living  10 4 3 1 5  5   4     # Outcome Date GA Lbr Len/2nd Weight Sex Delivery Anes PTL Lv  10 CUR           9 TAB 05/2012             Comments: "almost hemorrage, BP went crazy"  8 TAB 2013          7 TAB 2013          6 TAB 2012          5 TRM 12/14/09 [redacted]w[redacted]d  7 lb 2 oz (3.232 kg) F SVD None  Y  4 TAB 2010          3 TRM 12/09/07 [redacted]w[redacted]d  7 lb 6 oz (3.345 kg) F SVD None  Y  2 TRM 08/24/05 [redacted]w[redacted]d  7 lb 13 oz (3.544 kg) M SVD None  Y     Comments: "I gained like 110lbs"  1 PRE 07/11/04 107w0d  1 lb 13 oz (0.822 kg) F SVD  Y Y     Past Medical History  Diagnosis Date  . Hypertension   . Pregnancy induced hypertension   . Anemia   . Headache(784.0)   . Depression   . Abnormal Pap smear 2005  . Sickle cell trait    Past Surgical History  Procedure Laterality Date  . Induced abortion    . Induced abortion    . Dilation and curettage of uterus     Family History  Problem Relation Age of Onset  . Other Neg Hx   . Hypertension Mother   . Hypertension Father   . Diabetes Maternal Aunt   . Sickle cell trait Daughter   . Sickle cell trait Son     HGB C     Exam    Uterus:     Pelvic Exam:    Perineum: No Hemorrhoids   Vulva: normal   Vagina:  normal mucosa   pH: n/a   Cervix: no cervical motion tenderness   Adnexa: normal adnexa   Bony Pelvis: average  System: Breast:  normal appearance, no masses or tenderness   Skin: normal coloration and turgor, no  rashes    Neurologic: oriented, normal   Extremities: no deformities   HEENT sclera clear, anicteric and oropharynx clear, no lesions   Mouth/Teeth mucous membranes moist, pharynx normal without lesions and dental hygiene good   Neck supple and no masses   Cardiovascular: regular rate and rhythm   Respiratory:  appears well, vitals normal, no respiratory distress, acyanotic, normal RR, neck free of mass or lymphadenopathy, chest clear, no wheezing, crepitations, rhonchi, normal symmetric air entry   Abdomen: soft, non-tender; bowel sounds normal; no masses,  no organomegaly   Urinary: urethral meatus normal      Assessment:    Pregnancy: [id number] Patient Active Problem List   Diagnosis Date Noted  . History of  preterm delivery, currently pregnant 12/15/2012        Plan:     Initial labs drawn. Prenatal vitamins. Problem list reviewed and updated. Genetic Screening discussed First Screen: requested.  Ultrasound discussed; fetal survey: requested.  Follow up in 4 weeks. [place] for FH (pt hs been having spotting for several weeks. CHTN---baseline labs and urine Need pap done at Dr. Tawni Levy office 17P at 16 weeks    Jerry Clyne H. 12/15/2012

## 2012-12-16 LAB — OBSTETRIC PANEL
Basophils Absolute: 0 10*3/uL (ref 0.0–0.1)
HCT: 34.7 % — ABNORMAL LOW (ref 36.0–46.0)
Hepatitis B Surface Ag: NEGATIVE
Lymphocytes Relative: 24 % (ref 12–46)
Monocytes Absolute: 0.5 10*3/uL (ref 0.1–1.0)
Neutro Abs: 5.6 10*3/uL (ref 1.7–7.7)
Platelets: 159 10*3/uL (ref 150–400)
RDW: 14.4 % (ref 11.5–15.5)
Rubella: 2.51 Index — ABNORMAL HIGH (ref <0.90)
WBC: 8.2 10*3/uL (ref 4.0–10.5)

## 2012-12-17 LAB — CULTURE, OB URINE: Colony Count: 50000

## 2012-12-21 ENCOUNTER — Telehealth: Payer: Self-pay | Admitting: General Practice

## 2012-12-21 NOTE — Telephone Encounter (Signed)
Patient called and left message stating she wanted her test results from 8/28. Called patient and stated I was returning her phone call and that not all of her test results are back yet but reviewed blood work with her. Patient verbalized understanding and had no further questions

## 2013-01-12 ENCOUNTER — Encounter: Payer: Self-pay | Admitting: Obstetrics & Gynecology

## 2013-01-12 ENCOUNTER — Encounter: Payer: Self-pay | Admitting: Obstetrics and Gynecology

## 2013-01-12 ENCOUNTER — Ambulatory Visit (INDEPENDENT_AMBULATORY_CARE_PROVIDER_SITE_OTHER): Payer: Medicaid Other | Admitting: Obstetrics & Gynecology

## 2013-01-12 VITALS — BP 123/80 | Temp 98.5°F | Wt 165.7 lb

## 2013-01-12 DIAGNOSIS — O09219 Supervision of pregnancy with history of pre-term labor, unspecified trimester: Secondary | ICD-10-CM

## 2013-01-12 DIAGNOSIS — O0991 Supervision of high risk pregnancy, unspecified, first trimester: Secondary | ICD-10-CM

## 2013-01-12 LAB — POCT URINALYSIS DIP (DEVICE)
Glucose, UA: NEGATIVE mg/dL
Hgb urine dipstick: NEGATIVE
Specific Gravity, Urine: 1.02 (ref 1.005–1.030)
Urobilinogen, UA: 1 mg/dL (ref 0.0–1.0)
pH: 7 (ref 5.0–8.0)

## 2013-01-12 NOTE — Progress Notes (Signed)
P=83. C/o of swelling in hands and feet at night time; resolves by morning.  C/o of intermittent lower back pain.  Pink dinged discharge a couple days ago per pt.  24hr urine invalid as patient did not collect every urine episode; pt. To start again Monday and bring here on Tuesday.

## 2013-01-12 NOTE — Progress Notes (Signed)
C/o left sciatica, which she had with previous pregnancy. Consider chiropractic. First screen scheduled next week

## 2013-01-12 NOTE — Patient Instructions (Signed)

## 2013-01-13 ENCOUNTER — Encounter (HOSPITAL_COMMUNITY): Payer: Self-pay | Admitting: *Deleted

## 2013-01-13 ENCOUNTER — Inpatient Hospital Stay (HOSPITAL_COMMUNITY)
Admission: AD | Admit: 2013-01-13 | Discharge: 2013-01-13 | Disposition: A | Discharge status: Home / Self Care | Payer: Medicaid Other | Source: Ambulatory Visit | Attending: Obstetrics & Gynecology | Admitting: Obstetrics & Gynecology

## 2013-01-13 DIAGNOSIS — N760 Acute vaginitis: Secondary | ICD-10-CM

## 2013-01-13 DIAGNOSIS — M549 Dorsalgia, unspecified: Secondary | ICD-10-CM | POA: Insufficient documentation

## 2013-01-13 DIAGNOSIS — A499 Bacterial infection, unspecified: Secondary | ICD-10-CM

## 2013-01-13 DIAGNOSIS — O26859 Spotting complicating pregnancy, unspecified trimester: Secondary | ICD-10-CM | POA: Insufficient documentation

## 2013-01-13 DIAGNOSIS — O26852 Spotting complicating pregnancy, second trimester: Secondary | ICD-10-CM

## 2013-01-13 DIAGNOSIS — O2390 Unspecified genitourinary tract infection in pregnancy, unspecified trimester: Secondary | ICD-10-CM | POA: Insufficient documentation

## 2013-01-13 DIAGNOSIS — B9689 Other specified bacterial agents as the cause of diseases classified elsewhere: Secondary | ICD-10-CM

## 2013-01-13 LAB — WET PREP, GENITAL
Trich, Wet Prep: NONE SEEN
Yeast Wet Prep HPF POC: NONE SEEN

## 2013-01-13 LAB — URINALYSIS, ROUTINE W REFLEX MICROSCOPIC
Glucose, UA: NEGATIVE mg/dL
Hgb urine dipstick: NEGATIVE
Protein, ur: NEGATIVE mg/dL
Specific Gravity, Urine: 1.01 (ref 1.005–1.030)

## 2013-01-13 MED ORDER — METRONIDAZOLE 500 MG PO TABS: 500.0000 mg | ORAL_TABLET | Freq: Two times a day (BID) | ORAL | Status: DC

## 2013-01-13 MED ORDER — ACETAMINOPHEN 500 MG PO TABS
1000.0000 mg | ORAL_TABLET | Freq: Once | ORAL | Status: AC
  Administered 2013-01-13: 1000 mg via ORAL
  Filled 2013-01-13: qty 2

## 2013-01-13 NOTE — MAU Provider Note (Signed)
Attestation of Attending Supervision of Advanced Practitioner (CNM/NP): Evaluation and management procedures were performed by the Advanced Practitioner under my supervision and collaboration.  I have reviewed the Advanced Practitioner's note and chart, and I agree with the management and plan.  HARRAWAY-SMITH, Darlinda Bellows 3:29 PM

## 2013-01-13 NOTE — MAU Note (Signed)
Patient states she passed a grape size dark red clot about 45 minutes ago. No bleeding at this time. Has had back pain for weeks.

## 2013-01-13 NOTE — MAU Provider Note (Signed)
History     CSN: [id number]  Arrival date and time: 01/13/13 1141   First Provider Initiated Contact with Patient 01/13/13 1314      Chief Complaint  Patient presents with  . Back Pain  . Vaginal Bleeding   HPI  Alison Coffey is a 27 y.o. [id number] at [redacted]w[redacted]d who presents today because she passed a dime sized clot about 45 mins ago. She states that she is not bleeding any longer. She states that her last intercourse was "weeks" ago. She is also having back pain, and has had the back pain for several weeks. She has not taken anything for the back pain. She denies any itching, vaginal odor.   Past Medical History  Diagnosis Date  . Hypertension   . Pregnancy induced hypertension   . Anemia   . Headache(784.0)   . Depression   . Abnormal Pap smear 2005  . Sickle cell trait     Past Surgical History  Procedure Laterality Date  . Induced abortion    . Induced abortion    . Dilation and curettage of uterus      Family History  Problem Relation Age of Onset  . Other Neg Hx   . Hypertension Mother   . Hypertension Father   . Diabetes Maternal Aunt   . Sickle cell trait Daughter   . Sickle cell trait Son     HGB C    History  Substance Use Topics  . Smoking status: Current Every Day Smoker -- 1.00 packs/day    Types: Cigarettes  . Smokeless tobacco: Never Used  . Alcohol Use: Yes     Comment: social    Allergies: No Known Allergies  Prescriptions prior to admission  Medication Sig Dispense Refill  . Prenatal Vit-Fe Fumarate-FA (PRENATAL MULTIVITAMIN) TABS tablet Take 1 tablet by mouth daily at 12 noon.        ROS Physical Exam   Blood pressure 119/71, pulse 91, temperature 97.9 F (36.6 C), temperature source Oral, resp. rate 16, height 5\' 7"  (1.702 m), weight 75.841 kg (167 lb 3.2 oz), last menstrual period 10/19/2012, SpO2 100.00%.  Physical Exam  Nursing note and vitals reviewed. Constitutional: She is oriented to person, place, and time. She appears  well-developed and well-nourished. No distress.  Cardiovascular: Normal rate.   Respiratory: Effort normal.  GI: Soft. There is no tenderness.  Genitourinary:   External: no lesion Vagina: small amount of white discharge Cervix: pink, smooth, no CMT Uterus: AGA, FHT 150s    Neurological: She is alert and oriented to person, place, and time.  Skin: Skin is warm and dry.  Psychiatric: She has a normal mood and affect.    MAU Course  Procedures  Results for orders placed during the hospital encounter of 01/13/13 (from the past 24 hour(s))  URINALYSIS, ROUTINE W REFLEX MICROSCOPIC     Status: Abnormal   Collection Time    01/13/13 11:44 AM      Result Value Range   Color, Urine STRAW (*) YELLOW   APPearance CLEAR  CLEAR   Specific Gravity, Urine 1.010  1.005 - 1.030   pH 6.5  5.0 - 8.0   Glucose, UA NEGATIVE  NEGATIVE mg/dL   Hgb urine dipstick NEGATIVE  NEGATIVE   Bilirubin Urine NEGATIVE  NEGATIVE   Ketones, ur NEGATIVE  NEGATIVE mg/dL   Protein, ur NEGATIVE  NEGATIVE mg/dL   Urobilinogen, UA 1.0  0.0 - 1.0 mg/dL   Nitrite NEGATIVE  NEGATIVE  Leukocytes, UA NEGATIVE  NEGATIVE  WET PREP, GENITAL     Status: Abnormal   Collection Time    01/13/13  1:22 PM      Result Value Range   Yeast Wet Prep HPF POC NONE SEEN  NONE SEEN   Trich, Wet Prep NONE SEEN  NONE SEEN   Clue Cells Wet Prep HPF POC MANY (*) NONE SEEN   WBC, Wet Prep HPF POC MODERATE (*) NONE SEEN     Assessment and Plan   1. BV (bacterial vaginosis)   2. Spotting in pregnancy, antepartum condition or complication, second trimester    RX: Flagyl 500mg  BID X 7  FU with the clinic as planned Bleeding precautions reviewed   Tawnya Crook 01/13/2013, 1:17 PM

## 2013-01-14 ENCOUNTER — Inpatient Hospital Stay (HOSPITAL_COMMUNITY)
Admission: AD | Admit: 2013-01-14 | Discharge: 2013-01-15 | Disposition: A | Discharge status: Home / Self Care | Payer: Medicaid Other | Source: Ambulatory Visit | Attending: Obstetrics & Gynecology | Admitting: Obstetrics & Gynecology

## 2013-01-14 ENCOUNTER — Encounter (HOSPITAL_COMMUNITY): Payer: Self-pay | Admitting: *Deleted

## 2013-01-14 DIAGNOSIS — O26899 Other specified pregnancy related conditions, unspecified trimester: Secondary | ICD-10-CM

## 2013-01-14 DIAGNOSIS — M549 Dorsalgia, unspecified: Secondary | ICD-10-CM

## 2013-01-14 DIAGNOSIS — O99891 Other specified diseases and conditions complicating pregnancy: Secondary | ICD-10-CM | POA: Insufficient documentation

## 2013-01-14 DIAGNOSIS — R109 Unspecified abdominal pain: Secondary | ICD-10-CM | POA: Insufficient documentation

## 2013-01-14 DIAGNOSIS — N949 Unspecified condition associated with female genital organs and menstrual cycle: Secondary | ICD-10-CM | POA: Insufficient documentation

## 2013-01-14 DIAGNOSIS — K5289 Other specified noninfective gastroenteritis and colitis: Secondary | ICD-10-CM | POA: Insufficient documentation

## 2013-01-14 LAB — URINALYSIS, ROUTINE W REFLEX MICROSCOPIC
Glucose, UA: NEGATIVE mg/dL
Leukocytes, UA: NEGATIVE
Protein, ur: NEGATIVE mg/dL
Urobilinogen, UA: 0.2 mg/dL (ref 0.0–1.0)
pH: 6 (ref 5.0–8.0)

## 2013-01-14 LAB — GC/CHLAMYDIA PROBE AMP: CT Probe RNA: NEGATIVE

## 2013-01-14 MED ORDER — CYCLOBENZAPRINE HCL 10 MG PO TABS
10.0000 mg | ORAL_TABLET | Freq: Once | ORAL | Status: AC
  Administered 2013-01-15: 10 mg via ORAL
  Filled 2013-01-14: qty 1

## 2013-01-14 MED ORDER — GI COCKTAIL ~~LOC~~
30.0000 mL | Freq: Once | ORAL | Status: AC
  Administered 2013-01-14: 30 mL via ORAL
  Filled 2013-01-14: qty 30

## 2013-01-14 MED ORDER — ONDANSETRON HCL 4 MG PO TABS
8.0000 mg | ORAL_TABLET | Freq: Once | ORAL | Status: AC
  Administered 2013-01-14: 8 mg via ORAL
  Filled 2013-01-14: qty 2

## 2013-01-14 NOTE — MAU Note (Addendum)
Severe abd cramps since last night. Upper stomach burns. Hunger and wants to eat but when eats either throws up or has diarrhea.. Was seen here last night with back pain

## 2013-01-14 NOTE — MAU Provider Note (Signed)
History     CSN: [id number]  Arrival date and time: 01/14/13 2206   First Provider Initiated Contact with Patient 01/14/13 2255      Chief Complaint  Patient presents with  . Abdominal Pain  . Back Pain   HPI  Pt is a [id number] here at [redacted]w[redacted]d wks with report of severe left upper abd pain (burning) since last night. Also reports lower pelvic pain.  Reports nausea and vomiting and loose stools.  States 5 loose stools in past 24 hours.  Vomited multiple times (cannot remember number) in past 24 hours.  Vomiting started last night after eating pizza (2100).  Seen in MAU yesterday for abdominal pain and mid back pain.  Diagnosed with bacterial vaginosis and spotting in pregnancy.  Pt declines GC/CT screen.   Past Medical History  Diagnosis Date  . Hypertension   . Pregnancy induced hypertension   . Anemia   . Headache(784.0)   . Depression   . Abnormal Pap smear 2005  . Sickle cell trait     Past Surgical History  Procedure Laterality Date  . Induced abortion    . Induced abortion    . Dilation and curettage of uterus      Family History  Problem Relation Age of Onset  . Other Neg Hx   . Hypertension Mother   . Hypertension Father   . Diabetes Maternal Aunt   . Sickle cell trait Daughter   . Sickle cell trait Son     HGB C    History  Substance Use Topics  . Smoking status: Current Every Day Smoker -- 1.00 packs/day    Types: Cigarettes  . Smokeless tobacco: Never Used  . Alcohol Use: Yes     Comment: social    Allergies: No Known Allergies  Prescriptions prior to admission  Medication Sig Dispense Refill  . Prenatal Vit-Fe Fumarate-FA (PRENATAL MULTIVITAMIN) TABS tablet Take 1 tablet by mouth daily at 12 noon.      . metroNIDAZOLE (FLAGYL) 500 MG tablet Take 1 tablet (500 mg total) by mouth 2 (two) times daily.  14 tablet  0    Review of Systems  Constitutional: Negative for fever and chills.  Gastrointestinal: Positive for heartburn, nausea, vomiting,  abdominal pain (lower pelvis) and diarrhea. Negative for constipation.  Genitourinary: Negative for dysuria, urgency, hematuria and flank pain.  Musculoskeletal: Negative for myalgias.   Physical Exam   Blood pressure 134/76, pulse 96, temperature 98.3 F (36.8 C), resp. rate 20, height 5\' 7"  (1.702 m), weight 76.658 kg (169 lb), last menstrual period 10/19/2012.  Physical Exam  Constitutional: She is oriented to person, place, and time. She appears well-developed and well-nourished. No distress.  HENT:  Head: Normocephalic.  Mouth/Throat: Mucous membranes are normal. Mucous membranes are not dry.  Neck: Normal range of motion. Neck supple.  Cardiovascular: Normal rate, regular rhythm and normal heart sounds.   Respiratory: Effort normal and breath sounds normal.  GI: Soft. There is no tenderness.  Genitourinary: No bleeding around the vagina.  Musculoskeletal: Normal range of motion. She exhibits no edema.  Neurological: She is alert and oriented to person, place, and time.  Skin: Skin is warm and dry.   Pt did not experience nausea or vomiting while in MAU.  Reports improvement in pain at this time.  MAU Course  Procedures  Results for orders placed during the hospital encounter of 01/14/13 (from the past 24 hour(s))  URINALYSIS, ROUTINE W REFLEX MICROSCOPIC     Status:  Abnormal   Collection Time    01/14/13 10:25 PM      Result Value Range   Color, Urine YELLOW  YELLOW   APPearance CLEAR  CLEAR   Specific Gravity, Urine >1.030 (*) 1.005 - 1.030   pH 6.0  5.0 - 8.0   Glucose, UA NEGATIVE  NEGATIVE mg/dL   Hgb urine dipstick NEGATIVE  NEGATIVE   Bilirubin Urine NEGATIVE  NEGATIVE   Ketones, ur NEGATIVE  NEGATIVE mg/dL   Protein, ur NEGATIVE  NEGATIVE mg/dL   Urobilinogen, UA 0.2  0.0 - 1.0 mg/dL   Nitrite NEGATIVE  NEGATIVE   Leukocytes, UA NEGATIVE  NEGATIVE    Assessment and Plan  [id number] at [redacted]w[redacted]d wks IUP Gastroenteritis - normal UA Back/Abdominal Pain in  Pregnancy - Normal exam  Plan: Discharge to home Provide reassurance RX Flexeril  [facility] 01/14/2013, 10:56 PM

## 2013-01-15 DIAGNOSIS — O9989 Other specified diseases and conditions complicating pregnancy, childbirth and the puerperium: Secondary | ICD-10-CM

## 2013-01-15 MED ORDER — CYCLOBENZAPRINE HCL 10 MG PO TABS: 10.0000 mg | ORAL_TABLET | Freq: Three times a day (TID) | ORAL | Status: DC | PRN

## 2013-01-15 NOTE — MAU Provider Note (Signed)
Attestation of Attending Supervision of Advanced Practitioner (CNM/NP): Evaluation and management procedures were performed by the Advanced Practitioner under my supervision and collaboration. I have reviewed the Advanced Practitioner's note and chart, and I agree with the management and plan.  Norwin Aleman H. 7:42 AM   

## 2013-01-15 NOTE — Progress Notes (Signed)
Threasa Heads CNM in earlier and discussed d/c plan. Written and verbal d/c instructions given and understanding voiced

## 2013-01-16 ENCOUNTER — Other Ambulatory Visit: Payer: Self-pay | Admitting: Obstetrics & Gynecology

## 2013-01-16 ENCOUNTER — Encounter (HOSPITAL_BASED_OUTPATIENT_CLINIC_OR_DEPARTMENT_OTHER): Payer: Self-pay | Admitting: *Deleted

## 2013-01-16 ENCOUNTER — Emergency Department (HOSPITAL_BASED_OUTPATIENT_CLINIC_OR_DEPARTMENT_OTHER)
Admission: EM | Admit: 2013-01-16 | Discharge: 2013-01-16 | Disposition: A | Discharge status: Home / Self Care | Payer: Medicaid Other | Attending: Emergency Medicine | Admitting: Emergency Medicine

## 2013-01-16 DIAGNOSIS — Z8659 Personal history of other mental and behavioral disorders: Secondary | ICD-10-CM | POA: Insufficient documentation

## 2013-01-16 DIAGNOSIS — F17200 Nicotine dependence, unspecified, uncomplicated: Secondary | ICD-10-CM | POA: Insufficient documentation

## 2013-01-16 DIAGNOSIS — Z862 Personal history of diseases of the blood and blood-forming organs and certain disorders involving the immune mechanism: Secondary | ICD-10-CM | POA: Insufficient documentation

## 2013-01-16 DIAGNOSIS — Z792 Long term (current) use of antibiotics: Secondary | ICD-10-CM | POA: Insufficient documentation

## 2013-01-16 DIAGNOSIS — K0889 Other specified disorders of teeth and supporting structures: Secondary | ICD-10-CM

## 2013-01-16 DIAGNOSIS — K089 Disorder of teeth and supporting structures, unspecified: Secondary | ICD-10-CM | POA: Insufficient documentation

## 2013-01-16 DIAGNOSIS — Z3682 Encounter for antenatal screening for nuchal translucency: Secondary | ICD-10-CM

## 2013-01-16 DIAGNOSIS — I10 Essential (primary) hypertension: Secondary | ICD-10-CM | POA: Insufficient documentation

## 2013-01-16 MED ORDER — AMOXICILLIN 500 MG PO CAPS: 500.0000 mg | ORAL_CAPSULE | Freq: Three times a day (TID) | ORAL | Status: DC

## 2013-01-16 MED ORDER — HYDROCODONE-ACETAMINOPHEN 5-325 MG PO TABS: 2.0000 | ORAL_TABLET | ORAL | Status: DC | PRN

## 2013-01-16 NOTE — ED Provider Notes (Signed)
History/physical exam/procedure(s) were performed by non-physician practitioner and as supervising physician I was immediately available for consultation/collaboration. I have reviewed all notes and am in agreement with care and plan.   Hilario Quarry, MD 01/16/13 [phone]

## 2013-01-16 NOTE — ED Provider Notes (Signed)
CSN: [id number]     Arrival date & time 01/16/13  2003 History   First MD Initiated Contact with Patient 01/16/13 2140     Chief Complaint  Patient presents with  . Dental Pain   (Consider location/radiation/quality/duration/timing/severity/associated sxs/prior Treatment) Patient is a 27 y.o. female presenting with tooth pain. The history is provided by the patient. No language interpreter was used.  Dental Pain Location:  Lower Lower teeth location:  17/LL 3rd molar and 18/LL 2nd molar Quality:  Aching Severity:  Moderate Onset quality:  Sudden Duration:  1 day Timing:  Constant Progression:  Worsening Chronicity:  New Relieved by:  Nothing Worsened by:  Nothing tried Ineffective treatments:  None tried   Past Medical History  Diagnosis Date  . Hypertension   . Pregnancy induced hypertension   . Anemia   . Headache(784.0)   . Depression   . Abnormal Pap smear 2005  . Sickle cell trait    Past Surgical History  Procedure Laterality Date  . Induced abortion    . Induced abortion    . Dilation and curettage of uterus     Family History  Problem Relation Age of Onset  . Other Neg Hx   . Hypertension Mother   . Hypertension Father   . Diabetes Maternal Aunt   . Sickle cell trait Daughter   . Sickle cell trait Son     HGB C   History  Substance Use Topics  . Smoking status: Current Every Day Smoker -- 1.00 packs/day    Types: Cigarettes  . Smokeless tobacco: Never Used  . Alcohol Use: Yes     Comment: social   OB History   Grav Para Term Preterm Abortions TAB SAB Ect Mult Living   10 4 3 1 5 5    4      Review of Systems  HENT: Positive for dental problem.   All other systems reviewed and are negative.    Allergies  Review of patient's allergies indicates no known allergies.  Home Medications   Current Outpatient Rx  Name  Route  Sig  Dispense  Refill  . cyclobenzaprine (FLEXERIL) 10 MG tablet   Oral   Take 1 tablet (10 mg total) by mouth 3  (three) times daily as needed for muscle spasms.   30 tablet   0   . metroNIDAZOLE (FLAGYL) 500 MG tablet   Oral   Take 1 tablet (500 mg total) by mouth 2 (two) times daily.   14 tablet   0   . Prenatal Vit-Fe Fumarate-FA (PRENATAL MULTIVITAMIN) TABS tablet   Oral   Take 1 tablet by mouth daily at 12 noon.         Marland Kitchen amoxicillin (AMOXIL) 500 MG capsule   Oral   Take 1 capsule (500 mg total) by mouth 3 (three) times daily.   30 capsule   0   . HYDROcodone-acetaminophen (NORCO/VICODIN) 5-325 MG per tablet   Oral   Take 2 tablets by mouth every 4 (four) hours as needed for pain.   16 tablet   0    BP 124/69  Pulse 77  Temp(Src) 99.2 F (37.3 C) (Oral)  Resp 16  Ht 5\' 6"  (1.676 m)  Wt 161 lb (73.029 kg)  BMI 26 kg/m2  SpO2 100%  LMP 10/19/2012 Physical Exam  Nursing note and vitals reviewed. Constitutional: She appears well-developed.  HENT:  Head: Normocephalic.  Right Ear: External ear normal.  Left Ear: External ear normal.  Mouth/Throat:  Oropharynx is clear and moist.  Eyes: Conjunctivae and EOM are normal. Pupils are equal, round, and reactive to light.  Neck: Normal range of motion.  Cardiovascular: Normal rate.   Pulmonary/Chest: Effort normal.  Musculoskeletal: Normal range of motion.  Neurological: She is alert.  Skin: Skin is warm.  Psychiatric: She has a normal mood and affect.    ED Course  Procedures (including critical care time) Labs Review Labs Reviewed - No data to display Imaging Review No results found.  MDM   1. Toothache    amoxicillian and hydrocodone    Lonia Skinner [place], [place] 01/16/13 2217

## 2013-01-16 NOTE — ED Notes (Signed)
Right lower tooth pain x 1 day 

## 2013-01-17 ENCOUNTER — Ambulatory Visit (HOSPITAL_COMMUNITY)
Admission: RE | Admit: 2013-01-17 | Discharge: 2013-01-17 | Disposition: A | Discharge status: Home / Self Care | Payer: Medicaid Other | Source: Ambulatory Visit | Attending: Obstetrics & Gynecology | Admitting: Obstetrics & Gynecology

## 2013-01-17 ENCOUNTER — Telehealth: Payer: Self-pay | Admitting: *Deleted

## 2013-01-17 ENCOUNTER — Other Ambulatory Visit: Payer: Medicaid Other

## 2013-01-17 DIAGNOSIS — Z3682 Encounter for antenatal screening for nuchal translucency: Secondary | ICD-10-CM

## 2013-01-17 DIAGNOSIS — Z36 Encounter for antenatal screening of mother: Secondary | ICD-10-CM | POA: Insufficient documentation

## 2013-01-17 NOTE — Telephone Encounter (Signed)
Message copied by Jill Side on Tue Jan 17, 2013 10:54 AM ------      Message from: Lesly Dukes      Created: Sat Jan 14, 2013  4:09 PM       This  Pt needs to collect her 24 hour urine and get a CMET for hypertension.  Please call pt and remind.  Please put note in epic. ------

## 2013-01-17 NOTE — Telephone Encounter (Signed)
Called pt and discussed the need to collect 24 hr urine and have labs drawn. She stated that she collected the specimen yesterday and is bringing to [place] today.

## 2013-01-17 NOTE — Progress Notes (Signed)
Fern Yahnke  was seen today for an ultrasound appointment.  See full report in [facility].  Impression: Single IUP at 12 6/7 weeks Normal NT (1.5 mm).  Nasal bone visualized. First trimester aneuploidy screen performed as noted above.    Recommendations: Please do not draw triple/quad screen, though patient should be offered MSAFP for neural tube defect screening.  Recommend ultrasound for anatomy at [redacted] weeks gestation.  Alpha Gula, MD

## 2013-01-18 LAB — COMPREHENSIVE METABOLIC PANEL
AST: 11 U/L (ref 0–37)
Albumin: 3.7 g/dL (ref 3.5–5.2)
Alkaline Phosphatase: 50 U/L (ref 39–117)
Potassium: 4 mEq/L (ref 3.5–5.3)
Sodium: 135 mEq/L (ref 135–145)
Total Protein: 6.5 g/dL (ref 6.0–8.3)

## 2013-01-18 LAB — CREATININE CLEARANCE, URINE, 24 HOUR
Creatinine, 24H Ur: 1738 mg/d (ref 700–1800)
Creatinine, Urine: 193.1 mg/dL

## 2013-01-19 ENCOUNTER — Other Ambulatory Visit: Payer: Self-pay

## 2013-01-20 NOTE — Addendum Note (Signed)
Encounter addended by: Adam Phenix, MD on: 01/20/2013 11:38 AM<BR>     Documentation filed: Problem List

## 2013-01-22 ENCOUNTER — Encounter: Payer: Self-pay | Admitting: Obstetrics & Gynecology

## 2013-01-27 ENCOUNTER — Telehealth: Payer: Self-pay | Admitting: *Deleted

## 2013-01-27 ENCOUNTER — Encounter: Payer: Self-pay | Admitting: *Deleted

## 2013-01-27 NOTE — Telephone Encounter (Signed)
Seirra called and requested results of nuchal translucency.  Called Alison Coffey and gave her the normal results.

## 2013-02-09 ENCOUNTER — Ambulatory Visit (INDEPENDENT_AMBULATORY_CARE_PROVIDER_SITE_OTHER): Payer: Medicaid Other | Admitting: Obstetrics & Gynecology

## 2013-02-09 VITALS — BP 125/74 | Temp 98.4°F | Wt 169.8 lb

## 2013-02-09 DIAGNOSIS — O0991 Supervision of high risk pregnancy, unspecified, first trimester: Secondary | ICD-10-CM

## 2013-02-09 DIAGNOSIS — O09219 Supervision of pregnancy with history of pre-term labor, unspecified trimester: Secondary | ICD-10-CM

## 2013-02-09 LAB — POCT URINALYSIS DIP (DEVICE)
Glucose, UA: NEGATIVE mg/dL
Ketones, ur: NEGATIVE mg/dL
Nitrite: NEGATIVE
Specific Gravity, Urine: 1.02 (ref 1.005–1.030)
Urobilinogen, UA: 4 mg/dL — ABNORMAL HIGH (ref 0.0–1.0)

## 2013-02-09 MED ORDER — HYDROXYPROGESTERONE CAPROATE 250 MG/ML IM OIL: 250.0000 mg | TOPICAL_OIL | Freq: Once | INTRAMUSCULAR | Status: DC

## 2013-02-09 NOTE — Progress Notes (Signed)
AFP today, normal first screen. Start 17 p recommended. [place] in 3+ weeks

## 2013-02-09 NOTE — Progress Notes (Signed)
P= 78 Pt. C/o of intermitted lower abdominal/pelvic pressure.

## 2013-02-09 NOTE — Patient Instructions (Signed)
Pregnancy - Second Trimester The second trimester of pregnancy (3 to 6 months) is a period of rapid growth for you and your baby. At the end of the sixth month, your baby is about 9 inches long and weighs 1 1/2 pounds. You will begin to feel the baby move between 18 and 20 weeks of the pregnancy. This is called quickening. Weight gain is faster. A clear fluid (colostrum) may leak out of your breasts. You may feel small contractions of the womb (uterus). This is known as false labor or Braxton-Hicks contractions. This is like a practice for labor when the baby is ready to be born. Usually, the problems with morning sickness have usually passed by the end of your first trimester. Some women develop small dark blotches (called cholasma, mask of pregnancy) on their face that usually goes away after the baby is born. Exposure to the sun makes the blotches worse. Acne may also develop in some pregnant women and pregnant women who have acne, may find that it goes away. PRENATAL EXAMS  Blood work may continue to be done during prenatal exams. These tests are done to check on your health and the probable health of your baby. Blood work is used to follow your blood levels (hemoglobin). Anemia (low hemoglobin) is common during pregnancy. Iron and vitamins are given to help prevent this. You will also be checked for diabetes between 24 and 28 weeks of the pregnancy. Some of the previous blood tests may be repeated.  The size of the uterus is measured during each visit. This is to make sure that the baby is continuing to grow properly according to the dates of the pregnancy.  Your blood pressure is checked every prenatal visit. This is to make sure you are not getting toxemia.  Your urine is checked to make sure you do not have an infection, diabetes or protein in the urine.  Your weight is checked often to make sure gains are happening at the suggested rate. This is to ensure that both you and your baby are  growing normally.  Sometimes, an ultrasound is performed to confirm the proper growth and development of the baby. This is a test which bounces harmless sound waves off the baby so your caregiver can more accurately determine due dates. Sometimes, a test is done on the amniotic fluid surrounding the baby. This test is called an amniocentesis. The amniotic fluid is obtained by sticking a needle into the belly (abdomen). This is done to check the chromosomes in instances where there is a concern about possible genetic problems with the baby. It is also sometimes done near the end of pregnancy if an early delivery is required. In this case, it is done to help make sure the baby's lungs are mature enough for the baby to live outside of the womb. CHANGES OCCURING IN THE SECOND TRIMESTER OF PREGNANCY Your body goes through many changes during pregnancy. They vary from person to person. Talk to your caregiver about changes you notice that you are concerned about.  During the second trimester, you will likely have an increase in your appetite. It is normal to have cravings for certain foods. This varies from person to person and pregnancy to pregnancy.  Your lower abdomen will begin to bulge.  You may have to urinate more often because the uterus and baby are pressing on your bladder. It is also common to get more bladder infections during pregnancy. You can help this by drinking lots of fluids   and emptying your bladder before and after intercourse.  You may begin to get stretch marks on your hips, abdomen, and breasts. These are normal changes in the body during pregnancy. There are no exercises or medicines to take that prevent this change.  You may begin to develop swollen and bulging veins (varicose veins) in your legs. Wearing support hose, elevating your feet for 15 minutes, 3 to 4 times a day and limiting salt in your diet helps lessen the problem.  Heartburn may develop as the uterus grows and  pushes up against the stomach. Antacids recommended by your caregiver helps with this problem. Also, eating smaller meals 4 to 5 times a day helps.  Constipation can be treated with a stool softener or adding bulk to your diet. Drinking lots of fluids, and eating vegetables, fruits, and whole grains are helpful.  Exercising is also helpful. If you have been very active up until your pregnancy, most of these activities can be continued during your pregnancy. If you have been less active, it is helpful to start an exercise program such as walking.  Hemorrhoids may develop at the end of the second trimester. Warm sitz baths and hemorrhoid cream recommended by your caregiver helps hemorrhoid problems.  Backaches may develop during this time of your pregnancy. Avoid heavy lifting, wear low heal shoes, and practice good posture to help with backache problems.  Some pregnant women develop tingling and numbness of their hand and fingers because of swelling and tightening of ligaments in the wrist (carpel tunnel syndrome). This goes away after the baby is born.  As your breasts enlarge, you may have to get a bigger bra. Get a comfortable, cotton, support bra. Do not get a nursing bra until the last month of the pregnancy if you will be nursing the baby.  You may get a dark line from your belly button to the pubic area called the linea nigra.  You may develop rosy cheeks because of increase blood flow to the face.  You may develop spider looking lines of the face, neck, arms, and chest. These go away after the baby is born. HOME CARE INSTRUCTIONS   It is extremely important to avoid all smoking, herbs, alcohol, and unprescribed drugs during your pregnancy. These chemicals affect the formation and growth of the baby. Avoid these chemicals throughout the pregnancy to ensure the delivery of a healthy infant.  Most of your home care instructions are the same as suggested for the first trimester of your  pregnancy. Keep your caregiver's appointments. Follow your caregiver's instructions regarding medicine use, exercise, and diet.  During pregnancy, you are providing food for you and your baby. Continue to eat regular, well-balanced meals. Choose foods such as meat, fish, milk and other low fat dairy products, vegetables, fruits, and whole-grain breads and cereals. Your caregiver will tell you of the ideal weight gain.  A physical sexual relationship may be continued up until near the end of pregnancy if there are no other problems. Problems could include early (premature) leaking of amniotic fluid from the membranes, vaginal bleeding, abdominal pain, or other medical or pregnancy problems.  Exercise regularly if there are no restrictions. Check with your caregiver if you are unsure of the safety of some of your exercises. The greatest weight gain will occur in the last 2 trimesters of pregnancy. Exercise will help you:  Control your weight.  Get you in shape for labor and delivery.  Lose weight after you have the baby.  Wear   a good support or jogging bra for breast tenderness during pregnancy. This may help if worn during sleep. Pads or tissues may be used in the bra if you are leaking colostrum.  Do not use hot tubs, steam rooms or saunas throughout the pregnancy.  Wear your seat belt at all times when driving. This protects you and your baby if you are in an accident.  Avoid raw meat, uncooked cheese, cat litter boxes, and soil used by cats. These carry germs that can cause birth defects in the baby.  The second trimester is also a good time to visit your dentist for your dental health if this has not been done yet. Getting your teeth cleaned is okay. Use a soft toothbrush. Brush gently during pregnancy.  It is easier to leak urine during pregnancy. Tightening up and strengthening the pelvic muscles will help with this problem. Practice stopping your urination while you are going to the  bathroom. These are the same muscles you need to strengthen. It is also the muscles you would use as if you were trying to stop from passing gas. You can practice tightening these muscles up 10 times a set and repeating this about 3 times per day. Once you know what muscles to tighten up, do not perform these exercises during urination. It is more likely to contribute to an infection by backing up the urine.  Ask for help if you have financial, counseling, or nutritional needs during pregnancy. Your caregiver will be able to offer counseling for these needs as well as refer you for other special needs.  Your skin may become oily. If so, wash your face with mild soap, use non-greasy moisturizer and oil or cream based makeup. MEDICINES AND DRUG USE IN PREGNANCY  Take prenatal vitamins as directed. The vitamin should contain 1 milligram of folic acid. Keep all vitamins out of reach of children. Only a couple vitamins or tablets containing iron may be fatal to a baby or young child when ingested.  Avoid use of all medicines, including herbs, over-the-counter medicines, not prescribed or suggested by your caregiver. Only take over-the-counter or prescription medicines for pain, discomfort, or fever as directed by your caregiver. Do not use aspirin.  Let your caregiver also know about herbs you may be using.  Alcohol is related to a number of birth defects. This includes fetal alcohol syndrome. All alcohol, in any form, should be avoided completely. Smoking will cause low birth rate and premature babies.  Street or illegal drugs are very harmful to the baby. They are absolutely forbidden. A baby born to an addicted mother will be addicted at birth. The baby will go through the same withdrawal an adult does. SEEK MEDICAL CARE IF:  You have any concerns or worries during your pregnancy. It is better to call with your questions if you feel they cannot wait, rather than worry about them. SEEK IMMEDIATE  MEDICAL CARE IF:   An unexplained oral temperature above 102 F (38.9 C) develops, or as your caregiver suggests.  You have leaking of fluid from the vagina (birth canal). If leaking membranes are suspected, take your temperature and tell your caregiver of this when you call.  There is vaginal spotting, bleeding, or passing clots. Tell your caregiver of the amount and how many pads are used. Light spotting in pregnancy is common, especially following intercourse.  You develop a bad smelling vaginal discharge with a change in the color from clear to white.  You continue to feel   sick to your stomach (nauseated) and have no relief from remedies suggested. You vomit blood or coffee ground-like materials.  You lose more than 2 pounds of weight or gain more than 2 pounds of weight over 1 week, or as suggested by your caregiver.  You notice swelling of your face, hands, feet, or legs.  You get exposed to German measles and have never had them.  You are exposed to fifth disease or chickenpox.  You develop belly (abdominal) pain. Round ligament discomfort is a common non-cancerous (benign) cause of abdominal pain in pregnancy. Your caregiver still must evaluate you.  You develop a bad headache that does not go away.  You develop fever, diarrhea, pain with urination, or shortness of breath.  You develop visual problems, blurry, or double vision.  You fall or are in a car accident or any kind of trauma.  There is mental or physical violence at home. Document Released: 03/31/2001 Document Revised: 12/30/2011 Document Reviewed: 10/03/2008 [employer] Patient Information 2014 [employer].  

## 2013-02-16 ENCOUNTER — Ambulatory Visit (INDEPENDENT_AMBULATORY_CARE_PROVIDER_SITE_OTHER): Payer: Medicaid Other | Admitting: Obstetrics and Gynecology

## 2013-02-16 VITALS — BP 111/69 | HR 93 | Wt 168.0 lb

## 2013-02-16 DIAGNOSIS — O0991 Supervision of high risk pregnancy, unspecified, first trimester: Secondary | ICD-10-CM

## 2013-02-16 DIAGNOSIS — O09212 Supervision of pregnancy with history of pre-term labor, second trimester: Secondary | ICD-10-CM

## 2013-02-16 DIAGNOSIS — O09219 Supervision of pregnancy with history of pre-term labor, unspecified trimester: Secondary | ICD-10-CM

## 2013-02-16 MED ORDER — HYDROXYPROGESTERONE CAPROATE 250 MG/ML IM OIL
250.0000 mg | TOPICAL_OIL | Freq: Once | INTRAMUSCULAR | Status: AC
  Administered 2013-02-16: 250 mg via INTRAMUSCULAR

## 2013-02-16 NOTE — Addendum Note (Signed)
Addended by: Franchot Mimes on: 02/16/2013 05:01 PM   Modules accepted: Orders

## 2013-02-17 LAB — AFP, QUAD SCREEN
AFP: 31.9 IU/mL
Down Syndrome Scr Risk Est: 1:11200 {titer}
HCG, Total: 13226 m[IU]/mL
Interpretation-AFP: NEGATIVE
MoM for AFP: 0.87
MoM for hCG: 0.65
Open Spina bifida: NEGATIVE
uE3 Mom: 0.76
uE3 Value: 0.5 ng/mL

## 2013-02-23 ENCOUNTER — Ambulatory Visit (INDEPENDENT_AMBULATORY_CARE_PROVIDER_SITE_OTHER): Payer: Medicaid Other

## 2013-02-23 ENCOUNTER — Other Ambulatory Visit: Payer: Self-pay

## 2013-02-23 VITALS — BP 119/76 | HR 92 | Wt 170.2 lb

## 2013-02-23 DIAGNOSIS — O09219 Supervision of pregnancy with history of pre-term labor, unspecified trimester: Secondary | ICD-10-CM

## 2013-02-23 MED ORDER — HYDROXYPROGESTERONE CAPROATE 250 MG/ML IM OIL
250.0000 mg | TOPICAL_OIL | Freq: Once | INTRAMUSCULAR | Status: AC
  Administered 2013-02-23: 250 mg via INTRAMUSCULAR

## 2013-03-02 ENCOUNTER — Ambulatory Visit (INDEPENDENT_AMBULATORY_CARE_PROVIDER_SITE_OTHER): Payer: Medicaid Other

## 2013-03-02 VITALS — BP 112/68 | HR 84 | Temp 97.5°F | Wt 171.0 lb

## 2013-03-02 DIAGNOSIS — O09219 Supervision of pregnancy with history of pre-term labor, unspecified trimester: Secondary | ICD-10-CM

## 2013-03-02 MED ORDER — HYDROXYPROGESTERONE CAPROATE 250 MG/ML IM OIL
250.0000 mg | TOPICAL_OIL | Freq: Once | INTRAMUSCULAR | Status: AC
  Administered 2013-03-02: 250 mg via INTRAMUSCULAR

## 2013-03-09 ENCOUNTER — Ambulatory Visit (HOSPITAL_COMMUNITY)
Admission: RE | Admit: 2013-03-09 | Discharge: 2013-03-09 | Disposition: A | Discharge status: Home / Self Care | Payer: Medicaid Other | Source: Ambulatory Visit | Attending: Obstetrics & Gynecology | Admitting: Obstetrics & Gynecology

## 2013-03-09 ENCOUNTER — Encounter: Payer: Self-pay | Admitting: Obstetrics and Gynecology

## 2013-03-09 ENCOUNTER — Other Ambulatory Visit: Payer: Self-pay | Admitting: Obstetrics & Gynecology

## 2013-03-09 ENCOUNTER — Ambulatory Visit (INDEPENDENT_AMBULATORY_CARE_PROVIDER_SITE_OTHER): Payer: Medicaid Other | Admitting: Obstetrics and Gynecology

## 2013-03-09 VITALS — BP 123/71 | Temp 97.6°F | Wt 173.7 lb

## 2013-03-09 DIAGNOSIS — O0991 Supervision of high risk pregnancy, unspecified, first trimester: Secondary | ICD-10-CM

## 2013-03-09 DIAGNOSIS — Z8751 Personal history of pre-term labor: Secondary | ICD-10-CM | POA: Insufficient documentation

## 2013-03-09 DIAGNOSIS — Z3689 Encounter for other specified antenatal screening: Secondary | ICD-10-CM | POA: Insufficient documentation

## 2013-03-09 DIAGNOSIS — O162 Unspecified maternal hypertension, second trimester: Secondary | ICD-10-CM

## 2013-03-09 DIAGNOSIS — O99330 Smoking (tobacco) complicating pregnancy, unspecified trimester: Secondary | ICD-10-CM | POA: Insufficient documentation

## 2013-03-09 DIAGNOSIS — O169 Unspecified maternal hypertension, unspecified trimester: Secondary | ICD-10-CM

## 2013-03-09 DIAGNOSIS — Z8619 Personal history of other infectious and parasitic diseases: Secondary | ICD-10-CM

## 2013-03-09 DIAGNOSIS — O09219 Supervision of pregnancy with history of pre-term labor, unspecified trimester: Secondary | ICD-10-CM

## 2013-03-09 LAB — POCT URINALYSIS DIP (DEVICE)
Protein, ur: NEGATIVE mg/dL
Specific Gravity, Urine: 1.015 (ref 1.005–1.030)
Urobilinogen, UA: 1 mg/dL (ref 0.0–1.0)
pH: 7 (ref 5.0–8.0)

## 2013-03-09 MED ORDER — HYDROXYPROGESTERONE CAPROATE 250 MG/ML IM OIL
250.0000 mg | TOPICAL_OIL | INTRAMUSCULAR | Status: DC
  Administered 2013-03-09 – 2013-04-19 (×7): 250 mg via INTRAMUSCULAR

## 2013-03-09 NOTE — Progress Notes (Signed)
Patient doing well without complaints. Anatomy ultrasound today. Continue weekly 17-P

## 2013-03-09 NOTE — Addendum Note (Signed)
Addended by: Candelaria Stagers E on: 03/09/2013 10:33 AM   Modules accepted: Orders

## 2013-03-09 NOTE — Progress Notes (Signed)
Pulse- 88   Pain/pressure-lower abd

## 2013-03-10 ENCOUNTER — Telehealth: Payer: Self-pay

## 2013-03-10 DIAGNOSIS — O285 Abnormal chromosomal and genetic finding on antenatal screening of mother: Secondary | ICD-10-CM

## 2013-03-10 DIAGNOSIS — O0992 Supervision of high risk pregnancy, unspecified, second trimester: Secondary | ICD-10-CM

## 2013-03-10 NOTE — Telephone Encounter (Signed)
Follow up US scheduled for November 26th @ 1130am.  Called pt and informed pt that her follow up appt has been scheduled for the above date.  I explained to the pt that her follow up is evaluate her cervical length.  I educated pt on what cervical length risks and possible management of it due to pt asked the question.  Pt stated understanding to the information given.  I informed her that the provider will determine follow up pending the results.  Pt stated understanding.

## 2013-03-12 ENCOUNTER — Encounter (HOSPITAL_COMMUNITY): Payer: Self-pay | Admitting: Family

## 2013-03-12 ENCOUNTER — Inpatient Hospital Stay (HOSPITAL_COMMUNITY)
Admission: AD | Admit: 2013-03-12 | Discharge: 2013-03-12 | Disposition: A | Discharge status: Home / Self Care | Payer: Medicaid Other | Source: Ambulatory Visit | Attending: Obstetrics & Gynecology | Admitting: Obstetrics & Gynecology

## 2013-03-12 DIAGNOSIS — O26879 Cervical shortening, unspecified trimester: Secondary | ICD-10-CM | POA: Insufficient documentation

## 2013-03-12 DIAGNOSIS — M545 Low back pain, unspecified: Secondary | ICD-10-CM | POA: Insufficient documentation

## 2013-03-12 DIAGNOSIS — A5901 Trichomonal vulvovaginitis: Secondary | ICD-10-CM

## 2013-03-12 DIAGNOSIS — O98819 Other maternal infectious and parasitic diseases complicating pregnancy, unspecified trimester: Secondary | ICD-10-CM | POA: Insufficient documentation

## 2013-03-12 DIAGNOSIS — R109 Unspecified abdominal pain: Secondary | ICD-10-CM | POA: Insufficient documentation

## 2013-03-12 DIAGNOSIS — O99330 Smoking (tobacco) complicating pregnancy, unspecified trimester: Secondary | ICD-10-CM | POA: Insufficient documentation

## 2013-03-12 DIAGNOSIS — O2390 Unspecified genitourinary tract infection in pregnancy, unspecified trimester: Secondary | ICD-10-CM

## 2013-03-12 LAB — URINE MICROSCOPIC-ADD ON

## 2013-03-12 LAB — URINALYSIS, ROUTINE W REFLEX MICROSCOPIC
Nitrite: NEGATIVE
Protein, ur: NEGATIVE mg/dL
Specific Gravity, Urine: 1.025 (ref 1.005–1.030)
Urobilinogen, UA: 2 mg/dL — ABNORMAL HIGH (ref 0.0–1.0)

## 2013-03-12 LAB — WET PREP, GENITAL

## 2013-03-12 MED ORDER — METRONIDAZOLE 500 MG PO TABS
2000.0000 mg | ORAL_TABLET | Freq: Once | ORAL | Status: AC
  Administered 2013-03-12: 2000 mg via ORAL
  Filled 2013-03-12: qty 4

## 2013-03-12 NOTE — MAU Note (Signed)
27 yo, G10P4 at [redacted]w[redacted]d, presents to MAU with c/o intermittent lower abdominal pain and constant lower back pain since Thursday. Reports pain is unrelieved with Tylenol and Hydrocodone, lower abdominal pain worse with walking and standing.   Reports having to change underwear frequently since Friday due to increased clear vaginal discharge with light vaginal spotting. Patient was told on Friday at clinic that her cervix was shortened.

## 2013-03-12 NOTE — MAU Note (Signed)
Pt states here for lof from vagina. Is unsure if it is amniotic fluid or urine. Does have odor like mildew. Brown watery discharge noted in underwear in room. Hx short cervix. Increased fluid/discharge since Thursday or Friday.

## 2013-03-12 NOTE — MAU Provider Note (Signed)
History     CSN: [id number]  Arrival date and time: 03/12/13 1155   First Provider Initiated Contact with Patient 03/12/13 1312      Chief Complaint  Patient presents with  . Vaginal Discharge  . Abdominal Pain   HPI Ms Middlesworth is a 27yo G10 P3154 at 20.4wks who presents for eval of heavy vag d/c (brown at times), abd pressure, and low back pain x 2-3d. She is a pt of the [facility] and her preg has been remarkable for 1) hx of 26wk del- receiving 17P 2) EAB x 5. Pt with some questions re phone calls from clinic at the end of last week re possibly elevated Trisomy 18 risk (was called back and told this was not the case), and also notification of pt having a shortened cx for which she will have a f/u U/S on 11/26. U/S from 11/20 rev'd by me: cx 3.6cm, nl anatomy. Also rev'd nl AFP and fetal screen.  OB History   Grav Para Term Preterm Abortions TAB SAB Ect Mult Living   10 4 3 1 5 5    4       Past Medical History  Diagnosis Date  . Hypertension   . Pregnancy induced hypertension   . Anemia   . Headache(784.0)   . Depression   . Abnormal Pap smear 2005  . Sickle cell trait     Past Surgical History  Procedure Laterality Date  . Induced abortion    . Induced abortion    . Dilation and curettage of uterus      Family History  Problem Relation Age of Onset  . Other Neg Hx   . Hypertension Mother   . Hypertension Father   . Diabetes Maternal Aunt   . Sickle cell trait Daughter   . Sickle cell trait Son     HGB C    History  Substance Use Topics  . Smoking status: Current Every Day Smoker -- 1.00 packs/day    Types: Cigarettes  . Smokeless tobacco: Never Used  . Alcohol Use: Yes     Comment: social    Allergies: No Known Allergies  Facility-administered medications prior to admission  Medication Dose Route Frequency Provider Last Rate Last Dose  . hydroxyprogesterone caproate (DELALUTIN) 250 mg/mL injection 250 mg  250 mg Intramuscular Weekly Peggy Constant, MD    250 mg at 03/09/13 1032   Prescriptions prior to admission  Medication Sig Dispense Refill  . cyclobenzaprine (FLEXERIL) 10 MG tablet Take 1 tablet (10 mg total) by mouth 3 (three) times daily as needed for muscle spasms.  30 tablet  0  . HYDROcodone-acetaminophen (NORCO/VICODIN) 5-325 MG per tablet Take 2 tablets by mouth every 4 (four) hours as needed for pain.  16 tablet  0  . hydroxyprogesterone caproate (DELALUTIN) 250 mg/mL OIL injection Inject 1 mL (250 mg total) into the muscle once.  0.98 mL  20  . Prenatal Vit-Fe Fumarate-FA (PRENATAL MULTIVITAMIN) TABS tablet Take 1 tablet by mouth daily at 12 noon.        ROS Physical Exam   Blood pressure 106/61, pulse 98, temperature 99 F (37.2 C), temperature source Oral, resp. rate 18, height 5\' 5"  (1.651 m), weight 78.472 kg (173 lb), last menstrual period 10/19/2012.  Physical Exam  Constitutional: She is oriented to person, place, and time. She appears well-developed.  HENT:  Head: Normocephalic.  Neck: Normal range of motion.  Cardiovascular: Normal rate.   Respiratory: Effort normal.  Genitourinary:  SSE: copious white 'lotiony' discharge, no evidence of brown/red; cx C/L  Musculoskeletal: Normal range of motion.  Neurological: She is alert and oriented to person, place, and time.  Skin: Skin is warm and dry.  Psychiatric: She has a normal mood and affect. Her behavior is normal. Thought content normal.   Fern: neg Microscopic wet-mount exam shows trichomonads, white blood cells.  Urinalysis    Component Value Date/Time   COLORURINE YELLOW 03/12/2013 1320   APPEARANCEUR HAZY* 03/12/2013 1320   LABSPEC 1.025 03/12/2013 1320   PHURINE 6.0 03/12/2013 1320   GLUCOSEU NEGATIVE 03/12/2013 1320   HGBUR TRACE* 03/12/2013 1320   BILIRUBINUR NEGATIVE 03/12/2013 1320   KETONESUR NEGATIVE 03/12/2013 1320   PROTEINUR NEGATIVE 03/12/2013 1320   UROBILINOGEN 2.0* 03/12/2013 1320   NITRITE NEGATIVE 03/12/2013 1320   LEUKOCYTESUR  MODERATE* 03/12/2013 1320      MAU Course  Procedures    Assessment and Plan  IUP at 20.4wks Trichomonas  D/C home Given Flagyl 2gm in MAU- instructions for partner to be tx and refrain from sex until 2 weeks after he has been tx PTL precautions F/U as scheduled in the clinic next week  Jaqwon Manfred 03/12/2013, 1:26 PM

## 2013-03-12 NOTE — MAU Provider Note (Signed)
Attestation of Attending Supervision of Advanced Practitioner (CNM/NP): Evaluation and management procedures were performed by the Advanced Practitioner under my supervision and collaboration. I have reviewed the Advanced Practitioner's note and chart, and I agree with the management and plan.  Adaiah Morken H. 4:50 PM

## 2013-03-13 LAB — URINE CULTURE
Colony Count: NO GROWTH
Culture: NO GROWTH

## 2013-03-13 LAB — GC/CHLAMYDIA PROBE AMP
CT Probe RNA: NEGATIVE
GC Probe RNA: NEGATIVE

## 2013-03-14 ENCOUNTER — Ambulatory Visit (HOSPITAL_COMMUNITY): Payer: Medicaid Other

## 2013-03-15 ENCOUNTER — Ambulatory Visit (HOSPITAL_COMMUNITY): Admission: RE | Admit: 2013-03-15 | Payer: Medicaid Other | Source: Ambulatory Visit

## 2013-03-15 ENCOUNTER — Ambulatory Visit (INDEPENDENT_AMBULATORY_CARE_PROVIDER_SITE_OTHER): Payer: Medicaid Other

## 2013-03-15 VITALS — BP 108/70 | HR 91 | Temp 98.5°F | Wt 176.2 lb

## 2013-03-15 DIAGNOSIS — O09219 Supervision of pregnancy with history of pre-term labor, unspecified trimester: Secondary | ICD-10-CM

## 2013-03-15 NOTE — Op Note (Signed)
Patient arrived at noon for an 11:30am scheduled exam.  Upon review of the patient chart, it appears the appointment was scheduled too early as it has only been 5 days since her last exam with next reccommended exam for cervical length to be two weeks from last exam and then every other week until [redacted] weeks gestation.  I attempted to schedule the patient for the appropriate exam timing; patient declined, indicating she would need to check her schedule.

## 2013-03-23 ENCOUNTER — Ambulatory Visit (INDEPENDENT_AMBULATORY_CARE_PROVIDER_SITE_OTHER): Payer: Medicaid Other | Admitting: General Practice

## 2013-03-23 VITALS — BP 112/63 | HR 93 | Temp 97.4°F | Ht 66.0 in | Wt 179.9 lb

## 2013-03-23 DIAGNOSIS — O09219 Supervision of pregnancy with history of pre-term labor, unspecified trimester: Secondary | ICD-10-CM

## 2013-03-23 DIAGNOSIS — Z7189 Other specified counseling: Secondary | ICD-10-CM

## 2013-03-27 ENCOUNTER — Ambulatory Visit (HOSPITAL_COMMUNITY)
Admission: RE | Admit: 2013-03-27 | Discharge: 2013-03-27 | Disposition: A | Discharge status: Home / Self Care | Payer: Medicaid Other | Source: Ambulatory Visit | Attending: Obstetrics & Gynecology | Admitting: Obstetrics & Gynecology

## 2013-03-27 ENCOUNTER — Other Ambulatory Visit: Payer: Self-pay | Admitting: Obstetrics & Gynecology

## 2013-03-27 DIAGNOSIS — O0992 Supervision of high risk pregnancy, unspecified, second trimester: Secondary | ICD-10-CM

## 2013-03-27 DIAGNOSIS — O99330 Smoking (tobacco) complicating pregnancy, unspecified trimester: Secondary | ICD-10-CM | POA: Insufficient documentation

## 2013-03-27 DIAGNOSIS — Z8751 Personal history of pre-term labor: Secondary | ICD-10-CM | POA: Insufficient documentation

## 2013-03-30 ENCOUNTER — Ambulatory Visit (INDEPENDENT_AMBULATORY_CARE_PROVIDER_SITE_OTHER): Payer: Medicaid Other | Admitting: General Practice

## 2013-03-30 VITALS — BP 113/68 | HR 92 | Temp 97.8°F | Ht 66.0 in | Wt 183.9 lb

## 2013-03-30 DIAGNOSIS — O09212 Supervision of pregnancy with history of pre-term labor, second trimester: Secondary | ICD-10-CM

## 2013-03-30 DIAGNOSIS — O09219 Supervision of pregnancy with history of pre-term labor, unspecified trimester: Secondary | ICD-10-CM

## 2013-04-06 ENCOUNTER — Ambulatory Visit (INDEPENDENT_AMBULATORY_CARE_PROVIDER_SITE_OTHER): Payer: Medicaid Other | Admitting: Family Medicine

## 2013-04-06 ENCOUNTER — Encounter: Payer: Self-pay | Admitting: Family Medicine

## 2013-04-06 VITALS — BP 137/79 | Temp 97.7°F | Wt 184.6 lb

## 2013-04-06 DIAGNOSIS — O09219 Supervision of pregnancy with history of pre-term labor, unspecified trimester: Secondary | ICD-10-CM

## 2013-04-06 DIAGNOSIS — O0991 Supervision of high risk pregnancy, unspecified, first trimester: Secondary | ICD-10-CM

## 2013-04-06 LAB — POCT URINALYSIS DIP (DEVICE)
Glucose, UA: NEGATIVE mg/dL
Protein, ur: 30 mg/dL — AB

## 2013-04-06 NOTE — Progress Notes (Signed)
P=90 Intermittent lower abdominal/pelvic pressure. Edema hands and feet.

## 2013-04-06 NOTE — Progress Notes (Signed)
No vb, no lof, no ctx, +FM  Discussed weight. Reviewed [place] results - repeat in 2 weeks  Alison Coffey is a 27 y.o. [id number] at [redacted]w[redacted]d  here for ROB visit.  Discussed with Patient:  -Plans to breast feed.  All questions answered. - Continue prenatal vitamins. - Reviewed genetics screen (Quad screen / first trimester screen / serum integrated screen / full integrated screen done/ not done).   -Reviewed fetal kick counts (Pt to perform daily at a time when the baby is active, lie laterally with both hands on belly in quiet room and count all movements (hiccups, shoulder rolls, obvious kicks, etc); pt is to report to clinic or MAU for less than 10 movements felt in a one hour time period-pt told as soon as she counts 10 movements the count is complete.)  - Routine precautions discussed (depression, infection s/s).   Patient provided with all pertinent phone numbers for emergencies. - RTC for any VB, regular, painful cramps/ctxs occurring at a rate of >2/10 min, fever (100.5 or higher), n/v/d, any pain that is unresolving or worsening, LOF, decreased fetal movement, CP, SOB, edema  Problems: Patient Active Problem List   Diagnosis Date Noted  . History of preterm delivery, currently pregnant 12/15/2012  . Supervision of high risk pregnancy in first trimester 12/15/2012  . Hypertension complicating pregnancy, childbirth and puerperium, antepartum 12/15/2012  . History of herpes simplex type 2 infection 12/15/2012    To Do: 1. 17P   [x ] Vaccines: Flu:  Tdap:  [x ] BCM: mirena  Edu: [x ] PTL precautions; [ ]  BF class; [ ]  childbirth class; [ ]   BF counseling;

## 2013-04-06 NOTE — Patient Instructions (Signed)
Second Trimester of Pregnancy The second trimester is from week 13 through week 28, months 4 through 6. The second trimester is often a time when you feel your best. Your body has also adjusted to being pregnant, and you begin to feel better physically. Usually, morning sickness has lessened or quit completely, you may have more energy, and you may have an increase in appetite. The second trimester is also a time when the fetus is growing rapidly. At the end of the sixth month, the fetus is about 9 inches long and weighs about 1 pounds. You will likely begin to feel the baby move (quickening) between 18 and 20 weeks of the pregnancy. BODY CHANGES Your body goes through many changes during pregnancy. The changes vary from woman to woman.   Your weight will continue to increase. You will notice your lower abdomen bulging out.  You may begin to get stretch marks on your hips, abdomen, and breasts.  You may develop headaches that can be relieved by medicines approved by your caregiver.  You may urinate more often because the fetus is pressing on your bladder.  You may develop or continue to have heartburn as a result of your pregnancy.  You may develop constipation because certain hormones are causing the muscles that push waste through your intestines to slow down.  You may develop hemorrhoids or swollen, bulging veins (varicose veins).  You may have back pain because of the weight gain and pregnancy hormones relaxing your joints between the bones in your pelvis and as a result of a shift in weight and the muscles that support your balance.  Your breasts will continue to grow and be tender.  Your gums may bleed and may be sensitive to brushing and flossing.  Dark spots or blotches (chloasma, mask of pregnancy) may develop on your face. This will likely fade after the baby is born.  A dark line from your belly button to the pubic area (linea nigra) may appear. This will likely fade after the  baby is born. WHAT TO EXPECT AT YOUR PRENATAL VISITS During a routine prenatal visit:  You will be weighed to make sure you and the fetus are growing normally.  Your blood pressure will be taken.  Your abdomen will be measured to track your baby's growth.  The fetal heartbeat will be listened to.  Any test results from the previous visit will be discussed. Your caregiver may ask you:  How you are feeling.  If you are feeling the baby move.  If you have had any abnormal symptoms, such as leaking fluid, bleeding, severe headaches, or abdominal cramping.  If you have any questions. Other tests that may be performed during your second trimester include:  Blood tests that check for:  Low iron levels (anemia).  Gestational diabetes (between 24 and 28 weeks).  Rh antibodies.  Urine tests to check for infections, diabetes, or protein in the urine.  An ultrasound to confirm the proper growth and development of the baby.  An amniocentesis to check for possible genetic problems.  Fetal screens for spina bifida and Down syndrome. HOME CARE INSTRUCTIONS   Avoid all smoking, herbs, alcohol, and unprescribed drugs. These chemicals affect the formation and growth of the baby.  Follow your caregiver's instructions regarding medicine use. There are medicines that are either safe or unsafe to take during pregnancy.  Exercise only as directed by your caregiver. Experiencing uterine cramps is a good sign to stop exercising.  Continue to eat regular,   healthy meals.  Wear a good support bra for breast tenderness.  Do not use hot tubs, steam rooms, or saunas.  Wear your seat belt at all times when driving.  Avoid raw meat, uncooked cheese, cat litter boxes, and soil used by cats. These carry germs that can cause birth defects in the baby.  Take your prenatal vitamins.  Try taking a stool softener (if your caregiver approves) if you develop constipation. Eat more high-fiber foods,  such as fresh vegetables or fruit and whole grains. Drink plenty of fluids to keep your urine clear or pale yellow.  Take warm sitz baths to soothe any pain or discomfort caused by hemorrhoids. Use hemorrhoid cream if your caregiver approves.  If you develop varicose veins, wear support hose. Elevate your feet for 15 minutes, 3 4 times a day. Limit salt in your diet.  Avoid heavy lifting, wear low heel shoes, and practice good posture.  Rest with your legs elevated if you have leg cramps or low back pain.  Visit your dentist if you have not gone yet during your pregnancy. Use a soft toothbrush to brush your teeth and be gentle when you floss.  A sexual relationship may be continued unless your caregiver directs you otherwise.  Continue to go to all your prenatal visits as directed by your caregiver. SEEK MEDICAL CARE IF:   You have dizziness.  You have mild pelvic cramps, pelvic pressure, or nagging pain in the abdominal area.  You have persistent nausea, vomiting, or diarrhea.  You have a bad smelling vaginal discharge.  You have pain with urination. SEEK IMMEDIATE MEDICAL CARE IF:   You have a fever.  You are leaking fluid from your vagina.  You have spotting or bleeding from your vagina.  You have severe abdominal cramping or pain.  You have rapid weight gain or loss.  You have shortness of breath with chest pain.  You notice sudden or extreme swelling of your face, hands, ankles, feet, or legs.  You have not felt your baby move in over an hour.  You have severe headaches that do not go away with medicine.  You have vision changes. Document Released: 03/31/2001 Document Revised: 12/07/2012 Document Reviewed: 06/07/2012 Exit[employer] Patient Information 2014 [employer].  

## 2013-04-06 NOTE — Progress Notes (Signed)
U/S scheduled 12/23 at 330 pm in MFM.

## 2013-04-07 ENCOUNTER — Other Ambulatory Visit: Payer: Self-pay | Admitting: Family Medicine

## 2013-04-11 ENCOUNTER — Ambulatory Visit (HOSPITAL_COMMUNITY)
Admission: RE | Admit: 2013-04-11 | Discharge: 2013-04-11 | Disposition: A | Discharge status: Home / Self Care | Payer: Medicaid Other | Source: Ambulatory Visit | Attending: Obstetrics & Gynecology | Admitting: Obstetrics & Gynecology

## 2013-04-11 VITALS — BP 112/67 | HR 100 | Wt 188.0 lb

## 2013-04-11 DIAGNOSIS — O09219 Supervision of pregnancy with history of pre-term labor, unspecified trimester: Secondary | ICD-10-CM | POA: Insufficient documentation

## 2013-04-11 NOTE — Progress Notes (Signed)
Alison Coffey  was seen today for an ultrasound appointment.  See full report in [facility].  Impression: Single IUP at 24 6/7 weeks Normal interval anatomy - the anatomic fetal survey is now complete Fetal growth is appropriate (76th %tile) Normal amniotic fluid volume  TVUS - cervical length 1.8 cm with some V-shaped funneling  Recommendations: Recommend continued 17-P injections Recommend course of betamethasone - plans to have first dose done tomorrow in conjunction with her 17-P injection Follow up ultrasound in 2 weeks for cervical length  Alpha Gula, MD

## 2013-04-12 ENCOUNTER — Ambulatory Visit (INDEPENDENT_AMBULATORY_CARE_PROVIDER_SITE_OTHER): Payer: Medicaid Other

## 2013-04-12 VITALS — BP 129/77 | HR 101 | Temp 97.7°F | Wt 186.3 lb

## 2013-04-12 DIAGNOSIS — O09219 Supervision of pregnancy with history of pre-term labor, unspecified trimester: Secondary | ICD-10-CM

## 2013-04-12 NOTE — Progress Notes (Signed)
Pt. Came in today to get 17P and betamethasone. Pt states there is no way she can come tomorrow to get the second dose of betamethasone. Pt. Stated she would go Friday and Saturday to MAU for betamethasone injections.   17P given and tolerated well. Pt. C/o of a yellow discharge stating it is thick and "keeps my underwear wet all day." Denies itchyness, irritation, burning, smell, cramping or contractions. Pt. Does c/o of mild dull back ache. Discussed symtpoms with Dr. Jolayne Panther who states discharge is normal as she denies symptoms but to return and be seen if symptoms occur or back pain persists. Informed pt. Of this. Pt. Verbalized understanding and agreement and had no other questions or concerns.

## 2013-04-14 ENCOUNTER — Encounter: Payer: Self-pay | Admitting: Family Medicine

## 2013-04-17 ENCOUNTER — Inpatient Hospital Stay (HOSPITAL_COMMUNITY): Payer: Medicaid Other

## 2013-04-17 ENCOUNTER — Encounter (HOSPITAL_COMMUNITY): Payer: Self-pay

## 2013-04-17 ENCOUNTER — Inpatient Hospital Stay (EMERGENCY_DEPARTMENT_HOSPITAL)
Admission: AD | Admit: 2013-04-17 | Discharge: 2013-04-17 | Disposition: A | Discharge status: Home / Self Care | Payer: Medicaid Other | Source: Ambulatory Visit | Attending: Obstetrics & Gynecology | Admitting: Obstetrics & Gynecology

## 2013-04-17 ENCOUNTER — Inpatient Hospital Stay (HOSPITAL_COMMUNITY)
Admission: AD | Admit: 2013-04-17 | Discharge: 2013-04-17 | Disposition: A | Discharge status: Home / Self Care | Payer: Medicaid Other | Source: Ambulatory Visit | Attending: Obstetrics & Gynecology | Admitting: Obstetrics & Gynecology

## 2013-04-17 DIAGNOSIS — N898 Other specified noninflammatory disorders of vagina: Secondary | ICD-10-CM | POA: Insufficient documentation

## 2013-04-17 DIAGNOSIS — A5901 Trichomonal vulvovaginitis: Secondary | ICD-10-CM

## 2013-04-17 DIAGNOSIS — M545 Low back pain, unspecified: Secondary | ICD-10-CM | POA: Insufficient documentation

## 2013-04-17 DIAGNOSIS — O99330 Smoking (tobacco) complicating pregnancy, unspecified trimester: Secondary | ICD-10-CM | POA: Insufficient documentation

## 2013-04-17 DIAGNOSIS — O4700 False labor before 37 completed weeks of gestation, unspecified trimester: Secondary | ICD-10-CM | POA: Insufficient documentation

## 2013-04-17 DIAGNOSIS — O4702 False labor before 37 completed weeks of gestation, second trimester: Secondary | ICD-10-CM

## 2013-04-17 DIAGNOSIS — O98819 Other maternal infectious and parasitic diseases complicating pregnancy, unspecified trimester: Secondary | ICD-10-CM | POA: Insufficient documentation

## 2013-04-17 DIAGNOSIS — O479 False labor, unspecified: Secondary | ICD-10-CM

## 2013-04-17 LAB — URINALYSIS, ROUTINE W REFLEX MICROSCOPIC
Glucose, UA: 250 mg/dL — AB
Nitrite: NEGATIVE
Specific Gravity, Urine: 1.02 (ref 1.005–1.030)
pH: 7 (ref 5.0–8.0)

## 2013-04-17 LAB — FETAL FIBRONECTIN: Fetal Fibronectin: NEGATIVE

## 2013-04-17 LAB — URINE MICROSCOPIC-ADD ON

## 2013-04-17 MED ORDER — ONDANSETRON HCL 4 MG PO TABS
4.0000 mg | ORAL_TABLET | Freq: Once | ORAL | Status: AC
  Administered 2013-04-17: 4 mg via ORAL
  Filled 2013-04-17: qty 1

## 2013-04-17 MED ORDER — BETAMETHASONE SOD PHOS & ACET 6 (3-3) MG/ML IJ SUSP
12.0000 mg | Freq: Once | INTRAMUSCULAR | Status: AC
  Administered 2013-04-17: 12 mg via INTRAMUSCULAR
  Filled 2013-04-17: qty 2

## 2013-04-17 MED ORDER — METRONIDAZOLE 500 MG PO TABS
2000.0000 mg | ORAL_TABLET | Freq: Once | ORAL | Status: AC
  Administered 2013-04-17: 2000 mg via ORAL
  Filled 2013-04-17: qty 4

## 2013-04-17 MED ORDER — NIFEDIPINE 10 MG PO CAPS
20.0000 mg | ORAL_CAPSULE | Freq: Once | ORAL | Status: AC
  Administered 2013-04-17: 20 mg via ORAL
  Filled 2013-04-17: qty 2

## 2013-04-17 NOTE — MAU Note (Signed)
Pt presents with contractions all day today. Back pain. Denies dysuria. Denies vaginal bleeding.

## 2013-04-17 NOTE — MAU Provider Note (Signed)
Chief Complaint:  Contractions and Back Pain   First Provider Initiated Contact with Patient 04/17/13 2114     HPI: Alison Coffey is a 27 y.o. [id number] at [redacted]w[redacted]d who presents to maternity admissions reporting constant LBP and ? Contractions all day. Also reports vaginal discharge. Denies LOF, vaginal bleeding, urinary complaints or GI complaints. Received BMZ #1 today at noon. X Trch 11/23. Received Flagyl . No IC since Tx. Good fetal movement.   Pregnancy Course: Complicated by Hx PTD and multiple EABs, on 17-P  Past Medical History: Past Medical History  Diagnosis Date  . Hypertension   . Pregnancy induced hypertension   . Anemia   . Headache(784.0)   . Depression   . Abnormal Pap smear 2005  . Sickle cell trait   . Preterm labor     Past obstetric history: OB History  Gravida Para Term Preterm AB SAB TAB Ectopic Multiple Living  10 4 3 1 5  5   4     # Outcome Date GA Lbr Len/2nd Weight Sex Delivery Anes PTL Lv  10 CUR           9 TAB 05/2012             Comments: "almost hemorrage, BP went crazy"  8 TAB 2013          7 TAB 2013          6 TAB 2012          5 TRM 12/14/09 [redacted]w[redacted]d  3.232 kg (7 lb 2 oz) F SVD None  Y  4 TAB 2010          3 TRM 12/09/07 [redacted]w[redacted]d  3.345 kg (7 lb 6 oz) F SVD None  Y  2 TRM 08/24/05 [redacted]w[redacted]d  3.544 kg (7 lb 13 oz) M SVD None  Y     Comments: "I gained like 110lbs"  1 PRE 07/11/04 [redacted]w[redacted]d  0.822 kg (1 lb 13 oz) F SVD  Y Y      Past Surgical History: Past Surgical History  Procedure Laterality Date  . Induced abortion    . Induced abortion    . Dilation and curettage of uterus       Family History: Family History  Problem Relation Age of Onset  . Other Neg Hx   . Hypertension Mother   . Hypertension Father   . Diabetes Maternal Aunt   . Sickle cell trait Daughter   . Sickle cell trait Son     HGB C    Social History: History  Substance Use Topics  . Smoking status: Current Every Day Smoker -- 1.00 packs/day    Types: Cigarettes  .  Smokeless tobacco: Never Used  . Alcohol Use: No     Comment: social    Allergies: No Known Allergies  Meds:  Facility-administered medications prior to admission  Medication Dose Route Frequency Provider Last Rate Last Dose  . hydroxyprogesterone caproate (DELALUTIN) 250 mg/mL injection 250 mg  250 mg Intramuscular Weekly Peggy Constant, MD   250 mg at 04/12/13 1045   Prescriptions prior to admission  Medication Sig Dispense Refill  . betamethasone acetate-betamethasone sodium phosphate (CELESTONE) 6 (3-3) MG/ML injection Inject 12 mg into the muscle once.      . Prenatal Vit-Fe Fumarate-FA (PRENATAL MULTIVITAMIN) TABS tablet Take 1 tablet by mouth daily at 12 noon.        ROS: Pertinent findings in history of present illness.  Physical Exam  Blood pressure 134/67,  pulse 100, temperature 98.5 F (36.9 C), temperature source Oral, resp. rate 18, height 5\' 6"  (1.676 m), weight 87.544 kg (193 lb), last menstrual period 10/19/2012, SpO2 99.00%. GENERAL: Well-developed, well-nourished female in no acute distress.  HEENT: normocephalic HEART: normal rate RESP: normal effort ABDOMEN: Soft, non-tender, gravid appropriate for gestational age. No CVAT. EXTREMITIES: Nontender, no edema NEURO: alert and oriented PELVIC EXAM: NEFG, moderate, malodorous,thin, white discharge, no blood Dilation: Fingertip Effacement: Thin Cervical Position: Posterior Exam by:: Dorathy Kinsman CNM  FHT:  Baseline 140 , moderate variability, accelerations present, no decelerations Contractions: UI   Labs: Results for orders placed during the hospital encounter of 04/17/13 (from the past 24 hour(s))  URINALYSIS, ROUTINE W REFLEX MICROSCOPIC     Status: Abnormal   Collection Time    04/17/13  7:20 PM      Result Value Range   Color, Urine YELLOW  YELLOW   APPearance CLEAR  CLEAR   Specific Gravity, Urine 1.020  1.005 - 1.030   pH 7.0  5.0 - 8.0   Glucose, UA 250 (*) NEGATIVE mg/dL   Hgb urine dipstick  NEGATIVE  NEGATIVE   Bilirubin Urine NEGATIVE  NEGATIVE   Ketones, ur NEGATIVE  NEGATIVE mg/dL   Protein, ur NEGATIVE  NEGATIVE mg/dL   Urobilinogen, UA 1.0  0.0 - 1.0 mg/dL   Nitrite NEGATIVE  NEGATIVE   Leukocytes, UA MODERATE (*) NEGATIVE  URINE MICROSCOPIC-ADD ON     Status: Abnormal   Collection Time    04/17/13  7:20 PM      Result Value Range   Squamous Epithelial / LPF MANY (*) RARE   WBC, UA 3-6  <3 WBC/hpf   RBC / HPF 3-6  <3 RBC/hpf   Bacteria, UA FEW (*) RARE   Urine-Other TRICHOMONAS PRESENT    FETAL FIBRONECTIN     Status: None   Collection Time    04/17/13  8:14 PM      Result Value Range   Fetal Fibronectin NEGATIVE  NEGATIVE    Imaging:  CL 0.9, Vtx  MAU Course: Pain resolved w/ PO fluids and Procardia x 1.  2gm Flagyl given.  CL stable at 0.9 cm.  Assessment: 1. Preterm contractions, second trimester   2. Trichomonal vaginitis in pregnancy in second trimester    Plan: Discharge home in stable condition. Preterm labor precautions and fetal kick counts Increase fluids and rest. Return for 2nd BMZ tomorrow at noon as scheduled.\ Follow-up Information   Follow up with [facility] Nurse On 04/19/2013. (for 17-P injection or as needed if symptoms worsen)       Follow up with [facility] [place] [facility]. (As needed if symptoms worsen)    Contact information:   [address] [id number] [place] [address] [phone]       Medication List         betamethasone acetate-betamethasone sodium phosphate 6 (3-3) MG/ML injection  Commonly known as:  CELESTONE  Inject 12 mg into the muscle once.     prenatal multivitamin Tabs tablet  Take 1 tablet by mouth daily at 12 noon.        Ray, CNM 04/17/2013 11:06 PM

## 2013-04-17 NOTE — MAU Provider Note (Signed)
Medical screening exam complete.  Patient is stable to wait for CNM for further evaluation  Freddi Starr, PA-C 04/17/2013 9:07 PM

## 2013-04-18 ENCOUNTER — Inpatient Hospital Stay (HOSPITAL_COMMUNITY)
Admission: AD | Admit: 2013-04-18 | Discharge: 2013-04-18 | Disposition: A | Discharge status: Home / Self Care | Payer: Medicaid Other | Source: Ambulatory Visit | Attending: Obstetrics & Gynecology | Admitting: Obstetrics & Gynecology

## 2013-04-18 DIAGNOSIS — O4700 False labor before 37 completed weeks of gestation, unspecified trimester: Secondary | ICD-10-CM | POA: Insufficient documentation

## 2013-04-18 MED ORDER — BETAMETHASONE SOD PHOS & ACET 6 (3-3) MG/ML IJ SUSP
12.0000 mg | Freq: Once | INTRAMUSCULAR | Status: AC
  Administered 2013-04-18: 12 mg via INTRAMUSCULAR
  Filled 2013-04-18: qty 2

## 2013-04-18 NOTE — MAU Note (Signed)
No complaints today.

## 2013-04-19 ENCOUNTER — Ambulatory Visit (HOSPITAL_COMMUNITY): Payer: Medicaid Other

## 2013-04-19 ENCOUNTER — Encounter: Payer: Self-pay | Admitting: Obstetrics & Gynecology

## 2013-04-19 ENCOUNTER — Inpatient Hospital Stay (HOSPITAL_COMMUNITY)
Admission: AD | Admit: 2013-04-19 | Discharge: 2013-04-19 | Disposition: A | Discharge status: Home / Self Care | Payer: Medicaid Other | Source: Ambulatory Visit | Attending: Obstetrics & Gynecology | Admitting: Obstetrics & Gynecology

## 2013-04-19 ENCOUNTER — Ambulatory Visit: Payer: Medicaid Other | Admitting: Obstetrics and Gynecology

## 2013-04-19 ENCOUNTER — Encounter (HOSPITAL_COMMUNITY): Payer: Self-pay | Admitting: *Deleted

## 2013-04-19 ENCOUNTER — Ambulatory Visit (HOSPITAL_COMMUNITY)
Admission: RE | Admit: 2013-04-19 | Discharge: 2013-04-19 | Disposition: A | Discharge status: Home / Self Care | Payer: Medicaid Other | Source: Ambulatory Visit | Attending: Obstetrics & Gynecology | Admitting: Obstetrics & Gynecology

## 2013-04-19 ENCOUNTER — Ambulatory Visit (INDEPENDENT_AMBULATORY_CARE_PROVIDER_SITE_OTHER): Payer: Medicaid Other | Admitting: Obstetrics & Gynecology

## 2013-04-19 VITALS — BP 120/79 | Temp 97.5°F | Wt 190.0 lb

## 2013-04-19 DIAGNOSIS — O368190 Decreased fetal movements, unspecified trimester, not applicable or unspecified: Secondary | ICD-10-CM

## 2013-04-19 DIAGNOSIS — O09219 Supervision of pregnancy with history of pre-term labor, unspecified trimester: Secondary | ICD-10-CM

## 2013-04-19 DIAGNOSIS — O368124 Decreased fetal movements, second trimester, fetus 4: Secondary | ICD-10-CM

## 2013-04-19 DIAGNOSIS — M549 Dorsalgia, unspecified: Secondary | ICD-10-CM | POA: Insufficient documentation

## 2013-04-19 DIAGNOSIS — O26879 Cervical shortening, unspecified trimester: Secondary | ICD-10-CM | POA: Insufficient documentation

## 2013-04-19 DIAGNOSIS — O169 Unspecified maternal hypertension, unspecified trimester: Secondary | ICD-10-CM

## 2013-04-19 LAB — POCT URINALYSIS DIP (DEVICE)
Bilirubin Urine: NEGATIVE
Glucose, UA: NEGATIVE mg/dL
Nitrite: NEGATIVE
Protein, ur: NEGATIVE mg/dL
Specific Gravity, Urine: 1.025 (ref 1.005–1.030)
Urobilinogen, UA: 1 mg/dL (ref 0.0–1.0)
pH: 6.5 (ref 5.0–8.0)

## 2013-04-19 LAB — URINE CULTURE

## 2013-04-19 LAB — RAPID URINE DRUG SCREEN, HOSP PERFORMED
Benzodiazepines: NOT DETECTED
Tetrahydrocannabinol: NOT DETECTED

## 2013-04-19 NOTE — Progress Notes (Signed)
Pt was here for her 17 OH-P.  She c/o decreased fetal movement.  She was sent for a BPP which was reportedly 6/10. She is being referred to MAU for eval.  Pt was not seen by a provider in the ofc.

## 2013-04-19 NOTE — MAU Provider Note (Signed)
History     CSN: [id number]  Arrival date and time: 04/19/13 1104   First Provider Initiated Contact with Patient 04/19/13 1156      Chief Complaint  Patient presents with  . monitoring    HPI pt is [id number] at [redacted]w[redacted]d who presents for monitoring after having decreased FM and BPP 6/8. Pt has shortened cervix and is on 17P- seen in clinic today.  Pt denies bleeding or LOF or abd pain. Rn note Zetta Bills, RN Registered Nurse Signed  MAU Note Service date: 04/19/2013 11:21 AM   Pt sent for monitoring from U/S, BPP 6/8. Pt was seen in clinic for 17P today, C/O decreased FM, was sent for BPP. Denies bleeding or LOF. Pt C/O back pain but states that is not a new problem.      Past Medical History  Diagnosis Date  . Hypertension   . Pregnancy induced hypertension   . Anemia   . Headache(784.0)   . Depression   . Abnormal Pap smear 2005  . Sickle cell trait   . Preterm labor     Past Surgical History  Procedure Laterality Date  . Induced abortion    . Induced abortion    . Dilation and curettage of uterus      Family History  Problem Relation Age of Onset  . Other Neg Hx   . Hypertension Mother   . Hypertension Father   . Diabetes Maternal Aunt   . Sickle cell trait Daughter   . Sickle cell trait Son     HGB C    History  Substance Use Topics  . Smoking status: Current Every Day Smoker -- 1.00 packs/day    Types: Cigarettes  . Smokeless tobacco: Never Used  . Alcohol Use: No     Comment: social    Allergies: No Known Allergies  Facility-administered medications prior to admission  Medication Dose Route Frequency Provider Last Rate Last Dose  . [DISCONTINUED] hydroxyprogesterone caproate (DELALUTIN) 250 mg/mL injection 250 mg  250 mg Intramuscular Weekly Peggy Constant, MD   250 mg at 04/19/13 6213   Prescriptions prior to admission  Medication Sig Dispense Refill  . hydroxyprogesterone caproate (DELALUTIN) 250 mg/mL OIL injection Inject 250 mg into the muscle  once a week. Thursdays      . Prenatal Vit-Fe Fumarate-FA (PRENATAL MULTIVITAMIN) TABS tablet Take 1 tablet by mouth daily at 12 noon.        Review of Systems  Constitutional: Negative for fever and chills.  Gastrointestinal: Negative for nausea, vomiting, abdominal pain, diarrhea and constipation.  Genitourinary: Negative for dysuria.  Musculoskeletal: Positive for back pain.   Physical Exam   Blood pressure 118/76, pulse 80, temperature 98.2 F (36.8 C), temperature source Oral, resp. rate 18, last menstrual period 10/19/2012.  Physical Exam  Nursing note and vitals reviewed. Constitutional: She is oriented to person, place, and time. She appears well-developed and well-nourished. No distress.  HENT:  Head: Normocephalic.  Eyes: Pupils are equal, round, and reactive to light.  Neck: Normal range of motion.  Cardiovascular: Normal rate.   Respiratory: Effort normal.  GI: Soft. She exhibits no distension. There is no tenderness.  FHR baseline 140 bpm with 15x15 accelerations and 6-25 BPM variability  Musculoskeletal: Normal range of motion.  Neurological: She is alert and oriented to person, place, and time.  Skin: Skin is warm and dry.  Psychiatric: She has a normal mood and affect.    MAU Course  Procedures Discussed with Dr. Penne Lash-  will continue to monitor and repeat BPP in 6 hrs after initial BPP Continue to monitor   Assessment and Plan    Alison Coffey 04/19/2013, 12:24 PM

## 2013-04-19 NOTE — Progress Notes (Signed)
Pulse 94. C/o of chronic lower back pain. Recent treatment for Trich.   0920 Patient scheduled today only for 17P however patient reports decreased in fetal movement since last night.  Per Dr. Erin Fulling patient to go straight to u/s for BPP. Called u/s and they were able to get her in for BPP this morning. Advised patient, she agrees and left for U/S upstairs. U/S states they will call here at the clinic for Dr. Erin Fulling for report.

## 2013-04-19 NOTE — MAU Note (Signed)
Pt sent for monitoring from U/S, BPP 6/8.  Pt was seen in clinic for 17P today, C/O decreased FM, was sent for BPP.  Denies bleeding or LOF.  Pt C/O back pain but states that is not a new problem.

## 2013-04-19 NOTE — MAU Provider Note (Signed)
Attestation of Attending Supervision of Advanced Practitioner (CNM/NP): Evaluation and management procedures were performed by the Advanced Practitioner under my supervision and collaboration. I have reviewed the Advanced Practitioner's note and chart, and I agree with the management and plan.  Whitney Hillegass H. 5:18 PM   

## 2013-04-19 NOTE — Discharge Summary (Signed)
Physician Discharge Summary  Patient ID: Alison Coffey MRN: [id number] DOB/AGE: [birth year] 27 y.o.  Admit date: 04/19/2013 Discharge date: 04/19/2013  Admission Diagnoses:  Decreased fetal movement with 6/8 BPP  Discharge Diagnoses: History of preterm labor  Discharged Condition: good  Hospital Course: 27 yo femaleG10P4 at 26 weeks and 0 days complained of decreased fetal movement during office visit today.  BPP was 6/8.  Pt was placed on observation status for continuous monitoring and a repeat BPP was done 6 hours later.  The repeat result is 8/8.  FHT monitoring has remained reassuring with accelerations.   Consults: None  Significant Diagnostic Studies: radiology: Ultrasound: BPP x2  Treatments: none  Discharge Exam: Blood pressure 116/73, pulse 80, temperature 98.2 F (36.8 C), temperature source Oral, resp. rate 18, height 5\' 6"  (1.676 m), weight 190 lb (86.183 kg), last menstrual period 10/19/2012. General appearance: alert, cooperative and no distress GI: soft, non tender Extremities: Homans sign is negative, no sign of DVT and no edema, redness or tenderness in the calves or thighs  Disposition: 01-Home or Self Care   Future Appointments Provider Department Dept Phone   04/27/2013 1:30 PM [facility] Nurse [facility] [phone]   04/27/2013 3:15 PM [facility] [place] [facility] [place] [facility] [phone]   Drink 32 oz of water 1 hour prior tp appt. time and hold bladder. DO NOT VOID- Bladder must be full at time of study.   05/04/2013 8:45 AM [place], MD [facility] [phone]       Medication List         hydroxyprogesterone caproate 250 mg/mL Oil injection  Commonly known as:  DELALUTIN  Inject 250 mg into the muscle once a week. Thursdays     prenatal multivitamin Tabs tablet  Take 1 tablet by mouth daily at 12 noon.           Follow-up Information   Please follow up. (Keep existing appoitments in [place] and [place]  clnic)       Signed: Rahaf Carbonell H. 04/19/2013, 5:12 PM

## 2013-04-20 NOTE — L&D Delivery Note (Signed)
Delivery Note At 2:38 AM a viable female was delivered via  (Presentation: LOA).  APGAR: 9 , 10 ; weight - pending. Placenta status: intact, spontaneous.  Cord: 3 vessel with the following complications: None.  Cord pH: N/A.  Anesthesia:  None Episiotomy: None Lacerations: None Suture Repair: N/A Est. Blood Loss (mL): 450 mL  Mom to postpartum.  Baby to Couplet care / Skin to Skin.  Forebag ruptured with meconium stained fluid.  Patient progressed quickly and delivered over intact perineum.  Mild PPH treated with Pitocin and Cytotec 800 mcg.  Bleeding controlled prior to leaving the room.  Tommie SamsJayce G Cook 07/26/2013, 3:02 AM  I was present for the delivery and agree with the above note.   Tawnya CrookHeather Donovan Hogan

## 2013-04-27 ENCOUNTER — Ambulatory Visit (HOSPITAL_COMMUNITY)
Admission: RE | Admit: 2013-04-27 | Discharge: 2013-04-27 | Disposition: A | Discharge status: Home / Self Care | Payer: Medicaid Other | Source: Ambulatory Visit | Attending: Obstetrics & Gynecology | Admitting: Obstetrics & Gynecology

## 2013-04-27 ENCOUNTER — Ambulatory Visit (INDEPENDENT_AMBULATORY_CARE_PROVIDER_SITE_OTHER): Payer: Medicaid Other | Admitting: Obstetrics and Gynecology

## 2013-04-27 VITALS — BP 136/84 | HR 90 | Wt 190.0 lb

## 2013-04-27 DIAGNOSIS — O10019 Pre-existing essential hypertension complicating pregnancy, unspecified trimester: Secondary | ICD-10-CM | POA: Insufficient documentation

## 2013-04-27 DIAGNOSIS — O2620 Pregnancy care for patient with recurrent pregnancy loss, unspecified trimester: Secondary | ICD-10-CM | POA: Insufficient documentation

## 2013-04-27 DIAGNOSIS — O09892 Supervision of other high risk pregnancies, second trimester: Secondary | ICD-10-CM

## 2013-04-27 DIAGNOSIS — O09213 Supervision of pregnancy with history of pre-term labor, third trimester: Secondary | ICD-10-CM

## 2013-04-27 DIAGNOSIS — O09212 Supervision of pregnancy with history of pre-term labor, second trimester: Secondary | ICD-10-CM

## 2013-04-27 DIAGNOSIS — O99330 Smoking (tobacco) complicating pregnancy, unspecified trimester: Secondary | ICD-10-CM | POA: Insufficient documentation

## 2013-04-27 DIAGNOSIS — O09219 Supervision of pregnancy with history of pre-term labor, unspecified trimester: Secondary | ICD-10-CM

## 2013-04-27 DIAGNOSIS — O26879 Cervical shortening, unspecified trimester: Secondary | ICD-10-CM | POA: Insufficient documentation

## 2013-04-27 MED ORDER — HYDROXYPROGESTERONE CAPROATE 250 MG/ML IM OIL
250.0000 mg | TOPICAL_OIL | Freq: Once | INTRAMUSCULAR | Status: AC
  Administered 2013-04-27: 250 mg via INTRAMUSCULAR

## 2013-05-04 ENCOUNTER — Ambulatory Visit (INDEPENDENT_AMBULATORY_CARE_PROVIDER_SITE_OTHER): Payer: Medicaid Other | Admitting: Family Medicine

## 2013-05-04 VITALS — BP 123/74 | Temp 98.2°F | Wt 188.7 lb

## 2013-05-04 DIAGNOSIS — O09219 Supervision of pregnancy with history of pre-term labor, unspecified trimester: Secondary | ICD-10-CM

## 2013-05-04 DIAGNOSIS — O169 Unspecified maternal hypertension, unspecified trimester: Secondary | ICD-10-CM

## 2013-05-04 DIAGNOSIS — O0991 Supervision of high risk pregnancy, unspecified, first trimester: Secondary | ICD-10-CM

## 2013-05-04 DIAGNOSIS — IMO0001 Reserved for inherently not codable concepts without codable children: Secondary | ICD-10-CM

## 2013-05-04 DIAGNOSIS — O09899 Supervision of other high risk pregnancies, unspecified trimester: Secondary | ICD-10-CM

## 2013-05-04 DIAGNOSIS — Z23 Encounter for immunization: Secondary | ICD-10-CM

## 2013-05-04 DIAGNOSIS — O0990 Supervision of high risk pregnancy, unspecified, unspecified trimester: Secondary | ICD-10-CM

## 2013-05-04 LAB — CBC
HCT: 32.3 % — ABNORMAL LOW (ref 36.0–46.0)
HEMOGLOBIN: 11.3 g/dL — AB (ref 12.0–15.0)
MCH: 27.8 pg (ref 26.0–34.0)
MCHC: 35 g/dL (ref 30.0–36.0)
MCV: 79.4 fL (ref 78.0–100.0)
Platelets: 182 10*3/uL (ref 150–400)
RBC: 4.07 MIL/uL (ref 3.87–5.11)
RDW: 13.6 % (ref 11.5–15.5)
WBC: 9.3 10*3/uL (ref 4.0–10.5)

## 2013-05-04 LAB — POCT URINALYSIS DIP (DEVICE)
BILIRUBIN URINE: NEGATIVE
Glucose, UA: NEGATIVE mg/dL
HGB URINE DIPSTICK: NEGATIVE
KETONES UR: NEGATIVE mg/dL
Leukocytes, UA: NEGATIVE
Nitrite: NEGATIVE
PH: 6 (ref 5.0–8.0)
PROTEIN: 30 mg/dL — AB
Specific Gravity, Urine: >=1.03 (ref 1.005–1.030)
Urobilinogen, UA: 1 mg/dL (ref 0.0–1.0)

## 2013-05-04 LAB — RPR

## 2013-05-04 MED ORDER — TETANUS-DIPHTH-ACELL PERTUSSIS 5-2.5-18.5 LF-MCG/0.5 IM SUSP: 0.5000 mL | Freq: Once | INTRAMUSCULAR | Status: DC

## 2013-05-04 MED ORDER — HYDROXYPROGESTERONE CAPROATE 250 MG/ML IM OIL
250.0000 mg | TOPICAL_OIL | Freq: Once | INTRAMUSCULAR | Status: AC
  Administered 2013-05-04: 250 mg via INTRAMUSCULAR

## 2013-05-04 NOTE — Progress Notes (Signed)
S: 28 yo [id number] here for ROBV for HR pregnancy for hx of PTD.  - still on 17 OHP - Doing well. No ctx, lof, VB. +FM.   O: see flowsheet  A/P  - doing well - cont 17-OHP - 28 week labs today - TDAP today - f/u in 2 weeks

## 2013-05-04 NOTE — Patient Instructions (Signed)
Third Trimester of Pregnancy  The third trimester is from week 29 through week 42, months 7 through 9. The third trimester is a time when the fetus is growing rapidly. At the end of the ninth month, the fetus is about 20 inches in length and weighs 6 10 pounds.   BODY CHANGES  Your body goes through many changes during pregnancy. The changes vary from woman to woman.    Your weight will continue to increase. You can expect to gain 25 35 pounds (11 16 kg) by the end of the pregnancy.   You may begin to get stretch marks on your hips, abdomen, and breasts.   You may urinate more often because the fetus is moving lower into your pelvis and pressing on your bladder.   You may develop or continue to have heartburn as a result of your pregnancy.   You may develop constipation because certain hormones are causing the muscles that push waste through your intestines to slow down.   You may develop hemorrhoids or swollen, bulging veins (varicose veins).   You may have pelvic pain because of the weight gain and pregnancy hormones relaxing your joints between the bones in your pelvis. Back aches may result from over exertion of the muscles supporting your posture.   Your breasts will continue to grow and be tender. A yellow discharge may leak from your breasts called colostrum.   Your belly button may stick out.   You may feel short of breath because of your expanding uterus.   You may notice the fetus "dropping," or moving lower in your abdomen.   You may have a bloody mucus discharge. This usually occurs a few days to a week before labor begins.   Your cervix becomes thin and soft (effaced) near your due date.  WHAT TO EXPECT AT YOUR PRENATAL EXAMS   You will have prenatal exams every 2 weeks until week 36. Then, you will have weekly prenatal exams. During a routine prenatal visit:   You will be weighed to make sure you and the fetus are growing normally.   Your blood pressure is taken.   Your abdomen will be  measured to track your baby's growth.   The fetal heartbeat will be listened to.   Any test results from the previous visit will be discussed.   You may have a cervical check near your due date to see if you have effaced.  At around 36 weeks, your caregiver will check your cervix. At the same time, your caregiver will also perform a test on the secretions of the vaginal tissue. This test is to determine if a type of bacteria, Group B streptococcus, is present. Your caregiver will explain this further.  Your caregiver may ask you:   What your birth plan is.   How you are feeling.   If you are feeling the baby move.   If you have had any abnormal symptoms, such as leaking fluid, bleeding, severe headaches, or abdominal cramping.   If you have any questions.  Other tests or screenings that may be performed during your third trimester include:   Blood tests that check for low iron levels (anemia).   Fetal testing to check the health, activity level, and growth of the fetus. Testing is done if you have certain medical conditions or if there are problems during the pregnancy.  FALSE LABOR  You may feel small, irregular contractions that eventually go away. These are called Braxton Hicks contractions, or   false labor. Contractions may last for hours, days, or even weeks before true labor sets in. If contractions come at regular intervals, intensify, or become painful, it is best to be seen by your caregiver.   SIGNS OF LABOR    Menstrual-like cramps.   Contractions that are 5 minutes apart or less.   Contractions that start on the top of the uterus and spread down to the lower abdomen and back.   A sense of increased pelvic pressure or back pain.   A watery or bloody mucus discharge that comes from the vagina.  If you have any of these signs before the 37th week of pregnancy, call your caregiver right away. You need to go to the hospital to get checked immediately.  HOME CARE INSTRUCTIONS    Avoid all  smoking, herbs, alcohol, and unprescribed drugs. These chemicals affect the formation and growth of the baby.   Follow your caregiver's instructions regarding medicine use. There are medicines that are either safe or unsafe to take during pregnancy.   Exercise only as directed by your caregiver. Experiencing uterine cramps is a good sign to stop exercising.   Continue to eat regular, healthy meals.   Wear a good support bra for breast tenderness.   Do not use hot tubs, steam rooms, or saunas.   Wear your seat belt at all times when driving.   Avoid raw meat, uncooked cheese, cat litter boxes, and soil used by cats. These carry germs that can cause birth defects in the baby.   Take your prenatal vitamins.   Try taking a stool softener (if your caregiver approves) if you develop constipation. Eat more high-fiber foods, such as fresh vegetables or fruit and whole grains. Drink plenty of fluids to keep your urine clear or pale yellow.   Take warm sitz baths to soothe any pain or discomfort caused by hemorrhoids. Use hemorrhoid cream if your caregiver approves.   If you develop varicose veins, wear support hose. Elevate your feet for 15 minutes, 3 4 times a day. Limit salt in your diet.   Avoid heavy lifting, wear low heal shoes, and practice good posture.   Rest a lot with your legs elevated if you have leg cramps or low back pain.   Visit your dentist if you have not gone during your pregnancy. Use a soft toothbrush to brush your teeth and be gentle when you floss.   A sexual relationship may be continued unless your caregiver directs you otherwise.   Do not travel far distances unless it is absolutely necessary and only with the approval of your caregiver.   Take prenatal classes to understand, practice, and ask questions about the labor and delivery.   Make a trial run to the hospital.   Pack your hospital bag.   Prepare the baby's nursery.   Continue to go to all your prenatal visits as directed  by your caregiver.  SEEK MEDICAL CARE IF:   You are unsure if you are in labor or if your water has broken.   You have dizziness.   You have mild pelvic cramps, pelvic pressure, or nagging pain in your abdominal area.   You have persistent nausea, vomiting, or diarrhea.   You have a bad smelling vaginal discharge.   You have pain with urination.  SEEK IMMEDIATE MEDICAL CARE IF:    You have a fever.   You are leaking fluid from your vagina.   You have spotting or bleeding from your vagina.     You have severe abdominal cramping or pain.   You have rapid weight loss or gain.   You have shortness of breath with chest pain.   You notice sudden or extreme swelling of your face, hands, ankles, feet, or legs.   You have not felt your baby move in over an hour.   You have severe headaches that do not go away with medicine.   You have vision changes.  Document Released: 03/31/2001 Document Revised: 12/07/2012 Document Reviewed: 06/07/2012  [employer] Patient Information 2014 [employer].

## 2013-05-04 NOTE — Progress Notes (Signed)
Pulse-  95 

## 2013-05-05 ENCOUNTER — Encounter: Payer: Self-pay | Admitting: Family Medicine

## 2013-05-05 LAB — GLUCOSE TOLERANCE, 1 HOUR (50G) W/O FASTING: Glucose, 1 Hour GTT: 99 mg/dL (ref 70–140)

## 2013-05-05 LAB — HIV ANTIBODY (ROUTINE TESTING W REFLEX): HIV: NONREACTIVE

## 2013-05-10 ENCOUNTER — Telehealth: Payer: Self-pay | Admitting: *Deleted

## 2013-05-10 NOTE — Telephone Encounter (Signed)
Spoke with patient and answered questions with test results.  Pt verbalizes understanding.

## 2013-05-10 NOTE — Telephone Encounter (Signed)
Pt called nurse line requesting test results.

## 2013-05-11 ENCOUNTER — Ambulatory Visit (INDEPENDENT_AMBULATORY_CARE_PROVIDER_SITE_OTHER): Payer: Medicaid Other | Admitting: *Deleted

## 2013-05-11 ENCOUNTER — Encounter: Payer: Self-pay | Admitting: *Deleted

## 2013-05-11 VITALS — BP 113/59 | HR 92

## 2013-05-11 DIAGNOSIS — O09219 Supervision of pregnancy with history of pre-term labor, unspecified trimester: Secondary | ICD-10-CM

## 2013-05-11 DIAGNOSIS — O09899 Supervision of other high risk pregnancies, unspecified trimester: Secondary | ICD-10-CM

## 2013-05-11 MED ORDER — HYDROXYPROGESTERONE CAPROATE 250 MG/ML IM OIL
250.0000 mg | TOPICAL_OIL | Freq: Once | INTRAMUSCULAR | Status: AC
  Administered 2013-05-11: 250 mg via INTRAMUSCULAR

## 2013-05-16 ENCOUNTER — Ambulatory Visit (INDEPENDENT_AMBULATORY_CARE_PROVIDER_SITE_OTHER): Payer: Medicaid Other | Admitting: *Deleted

## 2013-05-16 VITALS — BP 121/72 | HR 102 | Temp 98.0°F

## 2013-05-16 DIAGNOSIS — R519 Headache, unspecified: Secondary | ICD-10-CM

## 2013-05-16 DIAGNOSIS — R51 Headache: Secondary | ICD-10-CM

## 2013-05-18 ENCOUNTER — Ambulatory Visit (INDEPENDENT_AMBULATORY_CARE_PROVIDER_SITE_OTHER): Payer: Medicaid Other | Admitting: Family

## 2013-05-18 ENCOUNTER — Other Ambulatory Visit: Payer: Self-pay | Admitting: Obstetrics & Gynecology

## 2013-05-18 VITALS — BP 130/77 | Temp 97.5°F | Wt 192.8 lb

## 2013-05-18 DIAGNOSIS — IMO0001 Reserved for inherently not codable concepts without codable children: Secondary | ICD-10-CM

## 2013-05-18 DIAGNOSIS — O09219 Supervision of pregnancy with history of pre-term labor, unspecified trimester: Secondary | ICD-10-CM

## 2013-05-18 DIAGNOSIS — O0991 Supervision of high risk pregnancy, unspecified, first trimester: Secondary | ICD-10-CM

## 2013-05-18 DIAGNOSIS — O169 Unspecified maternal hypertension, unspecified trimester: Secondary | ICD-10-CM

## 2013-05-18 DIAGNOSIS — O09213 Supervision of pregnancy with history of pre-term labor, third trimester: Secondary | ICD-10-CM

## 2013-05-18 LAB — POCT URINALYSIS DIP (DEVICE)
Glucose, UA: NEGATIVE mg/dL
Ketones, ur: NEGATIVE mg/dL
Leukocytes, UA: NEGATIVE
Nitrite: NEGATIVE
PH: 6.5 (ref 5.0–8.0)
Protein, ur: 30 mg/dL — AB
Specific Gravity, Urine: 1.025 (ref 1.005–1.030)
Urobilinogen, UA: 2 mg/dL — ABNORMAL HIGH (ref 0.0–1.0)

## 2013-05-18 MED ORDER — HYDROXYPROGESTERONE CAPROATE 250 MG/ML IM OIL
250.0000 mg | TOPICAL_OIL | Freq: Once | INTRAMUSCULAR | Status: AC
  Administered 2013-05-18: 250 mg via INTRAMUSCULAR

## 2013-05-18 NOTE — Progress Notes (Signed)
Reports numbness in shoulder when laying on right side > possible decrease in circulation, advised moving more off shoulder.  No report of regular contractions, LOF, or vaginal bleeding.  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  Explained implications on pregnancy > meds at 34 wks and csection ONLY if lesions. 17 p today.

## 2013-05-18 NOTE — Progress Notes (Signed)
Pulse- 97  Edema-ankles  Pain-"numbness on right side at night", back pain

## 2013-05-25 ENCOUNTER — Ambulatory Visit (HOSPITAL_COMMUNITY)
Admission: RE | Admit: 2013-05-25 | Discharge: 2013-05-25 | Disposition: A | Discharge status: Home / Self Care | Payer: Medicaid Other | Source: Ambulatory Visit | Attending: Specialist | Admitting: Specialist

## 2013-05-25 ENCOUNTER — Ambulatory Visit (INDEPENDENT_AMBULATORY_CARE_PROVIDER_SITE_OTHER): Payer: Medicaid Other

## 2013-05-25 ENCOUNTER — Ambulatory Visit (HOSPITAL_COMMUNITY): Payer: Medicaid Other

## 2013-05-25 VITALS — BP 115/70 | HR 88 | Wt 196.0 lb

## 2013-05-25 VITALS — BP 115/70 | HR 97 | Wt 196.0 lb

## 2013-05-25 DIAGNOSIS — O10019 Pre-existing essential hypertension complicating pregnancy, unspecified trimester: Secondary | ICD-10-CM | POA: Insufficient documentation

## 2013-05-25 DIAGNOSIS — IMO0001 Reserved for inherently not codable concepts without codable children: Secondary | ICD-10-CM

## 2013-05-25 DIAGNOSIS — O09899 Supervision of other high risk pregnancies, unspecified trimester: Secondary | ICD-10-CM

## 2013-05-25 DIAGNOSIS — O26879 Cervical shortening, unspecified trimester: Secondary | ICD-10-CM | POA: Insufficient documentation

## 2013-05-25 DIAGNOSIS — O09219 Supervision of pregnancy with history of pre-term labor, unspecified trimester: Secondary | ICD-10-CM

## 2013-05-25 DIAGNOSIS — O2620 Pregnancy care for patient with recurrent pregnancy loss, unspecified trimester: Secondary | ICD-10-CM | POA: Insufficient documentation

## 2013-05-25 DIAGNOSIS — O169 Unspecified maternal hypertension, unspecified trimester: Secondary | ICD-10-CM

## 2013-05-25 DIAGNOSIS — O99330 Smoking (tobacco) complicating pregnancy, unspecified trimester: Secondary | ICD-10-CM | POA: Insufficient documentation

## 2013-05-25 DIAGNOSIS — Z8751 Personal history of pre-term labor: Secondary | ICD-10-CM | POA: Insufficient documentation

## 2013-05-25 MED ORDER — HYDROXYPROGESTERONE CAPROATE 250 MG/ML IM OIL
250.0000 mg | TOPICAL_OIL | Freq: Once | INTRAMUSCULAR | Status: AC
  Administered 2013-05-25: 250 mg via INTRAMUSCULAR

## 2013-05-25 NOTE — Progress Notes (Signed)
[facility]

## 2013-06-01 ENCOUNTER — Ambulatory Visit (INDEPENDENT_AMBULATORY_CARE_PROVIDER_SITE_OTHER): Payer: Medicaid Other | Admitting: *Deleted

## 2013-06-01 ENCOUNTER — Encounter: Payer: Medicaid Other | Admitting: Obstetrics & Gynecology

## 2013-06-01 VITALS — BP 121/72 | HR 99 | Temp 97.9°F | Wt 197.8 lb

## 2013-06-01 DIAGNOSIS — O09899 Supervision of other high risk pregnancies, unspecified trimester: Secondary | ICD-10-CM

## 2013-06-01 DIAGNOSIS — O09219 Supervision of pregnancy with history of pre-term labor, unspecified trimester: Secondary | ICD-10-CM

## 2013-06-01 MED ORDER — HYDROXYPROGESTERONE CAPROATE 250 MG/ML IM OIL
250.0000 mg | TOPICAL_OIL | Freq: Once | INTRAMUSCULAR | Status: AC
  Administered 2013-06-01: 250 mg via INTRAMUSCULAR

## 2013-06-01 NOTE — Addendum Note (Signed)
Addended by: Candelaria StagersHAIZLIP, Yardley Lekas E on: 06/01/2013 10:47 AM   Modules accepted: Level of Service

## 2013-06-08 ENCOUNTER — Ambulatory Visit (INDEPENDENT_AMBULATORY_CARE_PROVIDER_SITE_OTHER): Payer: Medicaid Other | Admitting: Family Medicine

## 2013-06-08 ENCOUNTER — Encounter: Payer: Self-pay | Admitting: Family Medicine

## 2013-06-08 VITALS — BP 130/75 | Wt 196.8 lb

## 2013-06-08 DIAGNOSIS — O0991 Supervision of high risk pregnancy, unspecified, first trimester: Secondary | ICD-10-CM

## 2013-06-08 DIAGNOSIS — O169 Unspecified maternal hypertension, unspecified trimester: Secondary | ICD-10-CM

## 2013-06-08 DIAGNOSIS — IMO0001 Reserved for inherently not codable concepts without codable children: Secondary | ICD-10-CM

## 2013-06-08 DIAGNOSIS — O26879 Cervical shortening, unspecified trimester: Secondary | ICD-10-CM | POA: Insufficient documentation

## 2013-06-08 LAB — POCT URINALYSIS DIP (DEVICE)
Glucose, UA: NEGATIVE mg/dL
HGB URINE DIPSTICK: NEGATIVE
Ketones, ur: 15 mg/dL — AB
Nitrite: NEGATIVE
PROTEIN: 30 mg/dL — AB
SPECIFIC GRAVITY, URINE: 1.02 (ref 1.005–1.030)
Urobilinogen, UA: 4 mg/dL — ABNORMAL HIGH (ref 0.0–1.0)
pH: 7 (ref 5.0–8.0)

## 2013-06-08 MED ORDER — HYDROXYPROGESTERONE CAPROATE 250 MG/ML IM OIL
250.0000 mg | TOPICAL_OIL | INTRAMUSCULAR | Status: DC
  Administered 2013-06-08 – 2013-06-15 (×2): 250 mg via INTRAMUSCULAR

## 2013-06-08 NOTE — Progress Notes (Signed)
+  FM, no lof, no vb, BH ctx Normal growth Reviewed US. Repeat in 4 weeks. Hx of PIH: Will start on NST/AFIs  [name] is a 28 y.o. [id number] at 135w1d by L=8 here for ROB visit.  Discussed with Patient:  -Plans to breast feed.  All questions answered. -Continue prenatal vitamins. -Reviewed fetal kick counts Pt to perform daily at a time when the baby is active, lie laterally with both hands on belly in quiet room and count all movements (hiccups, shoulder rolls, obvious kicks, etc); pt is to report to clinic L&D for less than 10 movements felt in a one hour time period-pt told as soon as she counts 10 movements the count is complete.  - Routine precautions discussed (depression, infection s/s).   Patient provided with all pertinent phone numbers for emergencies. - RTC for any VB, regular, painful cramps/ctxs occurring at a rate of >2/10 min, fever (100.5 or higher), n/v/d, any pain that is unresolving or worsening, LOF, decreased fetal movement, CP, SOB, edema - RTC in 1 weeks for next appt. Start NST/AFI  Problems: Patient Active Problem List   Diagnosis Date Noted  . History of preterm delivery, currently pregnant 12/15/2012  . Supervision of high risk pregnancy in first trimester 12/15/2012  . Hypertension complicating pregnancy, childbirth and puerperium, antepartum 12/15/2012  . History of herpes simplex type 2 infection 12/15/2012    To Do:   [ ]  Vaccines: Flu: decline Tdap: recd [x ] BCM: depo [x ] Readiness: baby has a place to sleep, car seat, other baby necessities.  Edu: [x ] PTL precautions; [ ]  BF class; [ ]  childbirth class; [ ]   BF counseling;

## 2013-06-08 NOTE — Patient Instructions (Signed)
Third Trimester of Pregnancy  The third trimester is from week 29 through week 42, months 7 through 9. The third trimester is a time when the fetus is growing rapidly. At the end of the ninth month, the fetus is about 20 inches in length and weighs 6 10 pounds.   BODY CHANGES  Your body goes through many changes during pregnancy. The changes vary from woman to woman.    Your weight will continue to increase. You can expect to gain 25 35 pounds (11 16 kg) by the end of the pregnancy.   You may begin to get stretch marks on your hips, abdomen, and breasts.   You may urinate more often because the fetus is moving lower into your pelvis and pressing on your bladder.   You may develop or continue to have heartburn as a result of your pregnancy.   You may develop constipation because certain hormones are causing the muscles that push waste through your intestines to slow down.   You may develop hemorrhoids or swollen, bulging veins (varicose veins).   You may have pelvic pain because of the weight gain and pregnancy hormones relaxing your joints between the bones in your pelvis. Back aches may result from over exertion of the muscles supporting your posture.   Your breasts will continue to grow and be tender. A yellow discharge may leak from your breasts called colostrum.   Your belly button may stick out.   You may feel short of breath because of your expanding uterus.   You may notice the fetus "dropping," or moving lower in your abdomen.   You may have a bloody mucus discharge. This usually occurs a few days to a week before labor begins.   Your cervix becomes thin and soft (effaced) near your due date.  WHAT TO EXPECT AT YOUR PRENATAL EXAMS   You will have prenatal exams every 2 weeks until week 36. Then, you will have weekly prenatal exams. During a routine prenatal visit:   You will be weighed to make sure you and the fetus are growing normally.   Your blood pressure is taken.   Your abdomen will be  measured to track your baby's growth.   The fetal heartbeat will be listened to.   Any test results from the previous visit will be discussed.   You may have a cervical check near your due date to see if you have effaced.  At around 36 weeks, your caregiver will check your cervix. At the same time, your caregiver will also perform a test on the secretions of the vaginal tissue. This test is to determine if a type of bacteria, Group B streptococcus, is present. Your caregiver will explain this further.  Your caregiver may ask you:   What your birth plan is.   How you are feeling.   If you are feeling the baby move.   If you have had any abnormal symptoms, such as leaking fluid, bleeding, severe headaches, or abdominal cramping.   If you have any questions.  Other tests or screenings that may be performed during your third trimester include:   Blood tests that check for low iron levels (anemia).   Fetal testing to check the health, activity level, and growth of the fetus. Testing is done if you have certain medical conditions or if there are problems during the pregnancy.  FALSE LABOR  You may feel small, irregular contractions that eventually go away. These are called Braxton Hicks contractions, or   false labor. Contractions may last for hours, days, or even weeks before true labor sets in. If contractions come at regular intervals, intensify, or become painful, it is best to be seen by your caregiver.   SIGNS OF LABOR    Menstrual-like cramps.   Contractions that are 5 minutes apart or less.   Contractions that start on the top of the uterus and spread down to the lower abdomen and back.   A sense of increased pelvic pressure or back pain.   A watery or bloody mucus discharge that comes from the vagina.  If you have any of these signs before the 37th week of pregnancy, call your caregiver right away. You need to go to the hospital to get checked immediately.  HOME CARE INSTRUCTIONS    Avoid all  smoking, herbs, alcohol, and unprescribed drugs. These chemicals affect the formation and growth of the baby.   Follow your caregiver's instructions regarding medicine use. There are medicines that are either safe or unsafe to take during pregnancy.   Exercise only as directed by your caregiver. Experiencing uterine cramps is a good sign to stop exercising.   Continue to eat regular, healthy meals.   Wear a good support bra for breast tenderness.   Do not use hot tubs, steam rooms, or saunas.   Wear your seat belt at all times when driving.   Avoid raw meat, uncooked cheese, cat litter boxes, and soil used by cats. These carry germs that can cause birth defects in the baby.   Take your prenatal vitamins.   Try taking a stool softener (if your caregiver approves) if you develop constipation. Eat more high-fiber foods, such as fresh vegetables or fruit and whole grains. Drink plenty of fluids to keep your urine clear or pale yellow.   Take warm sitz baths to soothe any pain or discomfort caused by hemorrhoids. Use hemorrhoid cream if your caregiver approves.   If you develop varicose veins, wear support hose. Elevate your feet for 15 minutes, 3 4 times a day. Limit salt in your diet.   Avoid heavy lifting, wear low heal shoes, and practice good posture.   Rest a lot with your legs elevated if you have leg cramps or low back pain.   Visit your dentist if you have not gone during your pregnancy. Use a soft toothbrush to brush your teeth and be gentle when you floss.   A sexual relationship may be continued unless your caregiver directs you otherwise.   Do not travel far distances unless it is absolutely necessary and only with the approval of your caregiver.   Take prenatal classes to understand, practice, and ask questions about the labor and delivery.   Make a trial run to the hospital.   Pack your hospital bag.   Prepare the baby's nursery.   Continue to go to all your prenatal visits as directed  by your caregiver.  SEEK MEDICAL CARE IF:   You are unsure if you are in labor or if your water has broken.   You have dizziness.   You have mild pelvic cramps, pelvic pressure, or nagging pain in your abdominal area.   You have persistent nausea, vomiting, or diarrhea.   You have a bad smelling vaginal discharge.   You have pain with urination.  SEEK IMMEDIATE MEDICAL CARE IF:    You have a fever.   You are leaking fluid from your vagina.   You have spotting or bleeding from your vagina.     You have severe abdominal cramping or pain.   You have rapid weight loss or gain.   You have shortness of breath with chest pain.   You notice sudden or extreme swelling of your face, hands, ankles, feet, or legs.   You have not felt your baby move in over an hour.   You have severe headaches that do not go away with medicine.   You have vision changes.  Document Released: 03/31/2001 Document Revised: 12/07/2012 Document Reviewed: 06/07/2012  [employer] Patient Information 2014 [employer].

## 2013-06-08 NOTE — Progress Notes (Signed)
Pulse: 99 

## 2013-06-12 ENCOUNTER — Ambulatory Visit (INDEPENDENT_AMBULATORY_CARE_PROVIDER_SITE_OTHER): Payer: Medicaid Other | Admitting: *Deleted

## 2013-06-12 VITALS — BP 111/70

## 2013-06-12 DIAGNOSIS — O169 Unspecified maternal hypertension, unspecified trimester: Secondary | ICD-10-CM

## 2013-06-12 DIAGNOSIS — IMO0001 Reserved for inherently not codable concepts without codable children: Secondary | ICD-10-CM

## 2013-06-12 NOTE — Progress Notes (Signed)
P-96 

## 2013-06-12 NOTE — Progress Notes (Signed)
2/23 NST reviewed and reactive 

## 2013-06-15 ENCOUNTER — Encounter: Payer: Self-pay | Admitting: Obstetrics & Gynecology

## 2013-06-15 ENCOUNTER — Ambulatory Visit (INDEPENDENT_AMBULATORY_CARE_PROVIDER_SITE_OTHER): Payer: Medicaid Other | Admitting: Obstetrics and Gynecology

## 2013-06-15 VITALS — BP 125/72 | Temp 98.2°F | Wt 195.0 lb

## 2013-06-15 DIAGNOSIS — O0991 Supervision of high risk pregnancy, unspecified, first trimester: Secondary | ICD-10-CM

## 2013-06-15 DIAGNOSIS — O169 Unspecified maternal hypertension, unspecified trimester: Secondary | ICD-10-CM

## 2013-06-15 DIAGNOSIS — O26879 Cervical shortening, unspecified trimester: Secondary | ICD-10-CM

## 2013-06-15 DIAGNOSIS — IMO0001 Reserved for inherently not codable concepts without codable children: Secondary | ICD-10-CM

## 2013-06-15 DIAGNOSIS — O0990 Supervision of high risk pregnancy, unspecified, unspecified trimester: Secondary | ICD-10-CM

## 2013-06-15 DIAGNOSIS — O09219 Supervision of pregnancy with history of pre-term labor, unspecified trimester: Secondary | ICD-10-CM

## 2013-06-15 DIAGNOSIS — Z23 Encounter for immunization: Secondary | ICD-10-CM

## 2013-06-15 DIAGNOSIS — Z8619 Personal history of other infectious and parasitic diseases: Secondary | ICD-10-CM

## 2013-06-15 DIAGNOSIS — O09899 Supervision of other high risk pregnancies, unspecified trimester: Secondary | ICD-10-CM

## 2013-06-15 LAB — POCT URINALYSIS DIP (DEVICE)
Bilirubin Urine: NEGATIVE
Glucose, UA: NEGATIVE mg/dL
HGB URINE DIPSTICK: NEGATIVE
Ketones, ur: NEGATIVE mg/dL
Nitrite: NEGATIVE
Protein, ur: NEGATIVE mg/dL
Specific Gravity, Urine: 1.015 (ref 1.005–1.030)
UROBILINOGEN UA: 2 mg/dL — AB (ref 0.0–1.0)
pH: 7 (ref 5.0–8.0)

## 2013-06-15 NOTE — Progress Notes (Signed)
Patient is doing well without complaints. FM/PTL precautions reviewed. NST reviewed and reactive Continue weekly 17-P

## 2013-06-15 NOTE — Progress Notes (Signed)
P=86,

## 2013-06-19 ENCOUNTER — Ambulatory Visit (INDEPENDENT_AMBULATORY_CARE_PROVIDER_SITE_OTHER): Payer: Medicaid Other | Admitting: *Deleted

## 2013-06-19 VITALS — BP 110/63

## 2013-06-19 DIAGNOSIS — O169 Unspecified maternal hypertension, unspecified trimester: Secondary | ICD-10-CM

## 2013-06-19 DIAGNOSIS — IMO0001 Reserved for inherently not codable concepts without codable children: Secondary | ICD-10-CM

## 2013-06-19 NOTE — Progress Notes (Signed)
P = 91   US for growth @ [facility] on 06/22/13

## 2013-06-19 NOTE — Progress Notes (Signed)
NST reviewed and reactive.  Alese Furniss L. Harraway-Smith, M.D., FACOG    

## 2013-06-22 ENCOUNTER — Encounter: Payer: Self-pay | Admitting: Obstetrics and Gynecology

## 2013-06-22 ENCOUNTER — Ambulatory Visit (HOSPITAL_COMMUNITY)
Admission: RE | Admit: 2013-06-22 | Discharge: 2013-06-22 | Disposition: A | Discharge status: Home / Self Care | Payer: Medicaid Other | Source: Ambulatory Visit | Attending: Specialist | Admitting: Specialist

## 2013-06-22 ENCOUNTER — Ambulatory Visit (INDEPENDENT_AMBULATORY_CARE_PROVIDER_SITE_OTHER): Payer: Medicaid Other | Admitting: Obstetrics and Gynecology

## 2013-06-22 ENCOUNTER — Encounter (HOSPITAL_COMMUNITY): Payer: Self-pay

## 2013-06-22 VITALS — BP 116/64 | Wt 197.7 lb

## 2013-06-22 DIAGNOSIS — O09899 Supervision of other high risk pregnancies, unspecified trimester: Secondary | ICD-10-CM

## 2013-06-22 DIAGNOSIS — O169 Unspecified maternal hypertension, unspecified trimester: Secondary | ICD-10-CM

## 2013-06-22 DIAGNOSIS — O10019 Pre-existing essential hypertension complicating pregnancy, unspecified trimester: Secondary | ICD-10-CM | POA: Insufficient documentation

## 2013-06-22 DIAGNOSIS — O0991 Supervision of high risk pregnancy, unspecified, first trimester: Secondary | ICD-10-CM

## 2013-06-22 DIAGNOSIS — IMO0001 Reserved for inherently not codable concepts without codable children: Secondary | ICD-10-CM

## 2013-06-22 DIAGNOSIS — O0990 Supervision of high risk pregnancy, unspecified, unspecified trimester: Secondary | ICD-10-CM

## 2013-06-22 DIAGNOSIS — O26879 Cervical shortening, unspecified trimester: Secondary | ICD-10-CM

## 2013-06-22 DIAGNOSIS — O09219 Supervision of pregnancy with history of pre-term labor, unspecified trimester: Secondary | ICD-10-CM

## 2013-06-22 DIAGNOSIS — Z8619 Personal history of other infectious and parasitic diseases: Secondary | ICD-10-CM

## 2013-06-22 LAB — POCT URINALYSIS DIP (DEVICE)
Bilirubin Urine: NEGATIVE
Glucose, UA: NEGATIVE mg/dL
HGB URINE DIPSTICK: NEGATIVE
KETONES UR: NEGATIVE mg/dL
NITRITE: NEGATIVE
PH: 7 (ref 5.0–8.0)
Protein, ur: 30 mg/dL — AB
Specific Gravity, Urine: 1.02 (ref 1.005–1.030)
Urobilinogen, UA: 2 mg/dL — ABNORMAL HIGH (ref 0.0–1.0)

## 2013-06-22 MED ORDER — VALACYCLOVIR HCL 1 G PO TABS: 1000.0000 mg | ORAL_TABLET | Freq: Every day | ORAL | Status: DC

## 2013-06-22 MED ORDER — HYDROXYPROGESTERONE CAPROATE 250 MG/ML IM OIL
250.0000 mg | TOPICAL_OIL | Freq: Once | INTRAMUSCULAR | Status: AC
  Administered 2013-06-22: 250 mg via INTRAMUSCULAR

## 2013-06-22 NOTE — Addendum Note (Signed)
Addended by: Faythe CasaBELLAMY, JEANETTA M on: 06/22/2013 12:14 PM   Modules accepted: Orders

## 2013-06-22 NOTE — Progress Notes (Signed)
Pulse- 86 Patient reports occasional cramps in lower pelvis when she feels contractions

## 2013-06-22 NOTE — Progress Notes (Signed)
Patient doing well without complaints. Continue weekly 17 p. F/U growth ultrasound scheduled for later today. Start Valtrex for suppression. Cultures next visit NST reviewed and reactive

## 2013-06-26 ENCOUNTER — Ambulatory Visit (INDEPENDENT_AMBULATORY_CARE_PROVIDER_SITE_OTHER): Payer: Medicaid Other | Admitting: *Deleted

## 2013-06-26 VITALS — BP 119/70

## 2013-06-26 DIAGNOSIS — IMO0001 Reserved for inherently not codable concepts without codable children: Secondary | ICD-10-CM

## 2013-06-26 DIAGNOSIS — O169 Unspecified maternal hypertension, unspecified trimester: Secondary | ICD-10-CM

## 2013-06-26 NOTE — Progress Notes (Signed)
P=79 

## 2013-06-29 ENCOUNTER — Other Ambulatory Visit: Payer: Medicaid Other

## 2013-06-29 ENCOUNTER — Ambulatory Visit: Payer: Medicaid Other | Admitting: *Deleted

## 2013-06-29 ENCOUNTER — Telehealth: Payer: Self-pay | Admitting: *Deleted

## 2013-06-29 ENCOUNTER — Ambulatory Visit (INDEPENDENT_AMBULATORY_CARE_PROVIDER_SITE_OTHER): Payer: Medicaid Other | Admitting: Obstetrics & Gynecology

## 2013-06-29 VITALS — BP 125/67 | Wt 198.2 lb

## 2013-06-29 DIAGNOSIS — IMO0001 Reserved for inherently not codable concepts without codable children: Secondary | ICD-10-CM

## 2013-06-29 DIAGNOSIS — O09219 Supervision of pregnancy with history of pre-term labor, unspecified trimester: Secondary | ICD-10-CM

## 2013-06-29 DIAGNOSIS — O0990 Supervision of high risk pregnancy, unspecified, unspecified trimester: Secondary | ICD-10-CM

## 2013-06-29 DIAGNOSIS — O169 Unspecified maternal hypertension, unspecified trimester: Secondary | ICD-10-CM

## 2013-06-29 DIAGNOSIS — O0991 Supervision of high risk pregnancy, unspecified, first trimester: Secondary | ICD-10-CM

## 2013-06-29 DIAGNOSIS — O09899 Supervision of other high risk pregnancies, unspecified trimester: Secondary | ICD-10-CM

## 2013-06-29 LAB — POCT URINALYSIS DIP (DEVICE)
BILIRUBIN URINE: NEGATIVE
Glucose, UA: NEGATIVE mg/dL
Hgb urine dipstick: NEGATIVE
KETONES UR: NEGATIVE mg/dL
LEUKOCYTES UA: NEGATIVE
Nitrite: NEGATIVE
Protein, ur: 30 mg/dL — AB
Specific Gravity, Urine: 1.02 (ref 1.005–1.030)
Urobilinogen, UA: 4 mg/dL — ABNORMAL HIGH (ref 0.0–1.0)
pH: 7 (ref 5.0–8.0)

## 2013-06-29 LAB — OB RESULTS CONSOLE GBS: GBS: POSITIVE

## 2013-06-29 LAB — OB RESULTS CONSOLE GC/CHLAMYDIA
Chlamydia: NEGATIVE
Gonorrhea: NEGATIVE

## 2013-06-29 MED ORDER — HYDROXYPROGESTERONE CAPROATE 250 MG/ML IM OIL
250.0000 mg | TOPICAL_OIL | Freq: Once | INTRAMUSCULAR | Status: AC
  Administered 2013-06-29: 250 mg via INTRAMUSCULAR

## 2013-06-29 MED ORDER — ACYCLOVIR 200 MG/5ML PO SUSP: 200.0000 mg | Freq: Three times a day (TID) | ORAL | Status: DC
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
## 2013-06-29 NOTE — Telephone Encounter (Addendum)
Message copied by Jill SideAY, Kuba Shepherd L on Thu Jun 29, 2013  2:36 PM ------      Message from: Lesly DukesLEGGETT, KELLY H      Created: Thu Jun 29, 2013 12:41 PM       Call pt.  Gave her acyclovir liquid tid for suppression of herpes outbreak (can swallow pills) ------ Called pt and informed her that a new prescription for herpes suppression has been sent to her pharmacy and can be picked up today.  Pt asked if her other medication (Valtrex) can be broken in half as they are scored and too big.  I said that she could do that if she wants. She cannot switch back and forth from the pills (Valtrex) and the liquid (Acyclovir).  She needs to choose one preparation and take it daily as prescribed.  Pt voiced understanding.

## 2013-06-29 NOTE — Progress Notes (Signed)
NST reactive.  No problems.  BP nml.   Pt can't swallow valtrex.  Can switch to acyclovir 200 mg suspension tid. US for growth scheduled per pt.

## 2013-06-30 LAB — GC/CHLAMYDIA PROBE AMP
CT PROBE, AMP APTIMA: NEGATIVE
GC PROBE AMP APTIMA: NEGATIVE

## 2013-07-01 ENCOUNTER — Encounter: Payer: Self-pay | Admitting: Obstetrics & Gynecology

## 2013-07-03 ENCOUNTER — Ambulatory Visit (INDEPENDENT_AMBULATORY_CARE_PROVIDER_SITE_OTHER): Payer: Medicaid Other | Admitting: *Deleted

## 2013-07-03 VITALS — BP 125/64

## 2013-07-03 DIAGNOSIS — IMO0001 Reserved for inherently not codable concepts without codable children: Secondary | ICD-10-CM

## 2013-07-03 DIAGNOSIS — O169 Unspecified maternal hypertension, unspecified trimester: Secondary | ICD-10-CM

## 2013-07-03 NOTE — Progress Notes (Signed)
P-85 

## 2013-07-04 NOTE — Progress Notes (Signed)
3/16 NST reviewed and reactive 

## 2013-07-06 ENCOUNTER — Other Ambulatory Visit: Payer: Medicaid Other

## 2013-07-06 ENCOUNTER — Encounter: Payer: Self-pay | Admitting: Obstetrics & Gynecology

## 2013-07-06 ENCOUNTER — Ambulatory Visit (INDEPENDENT_AMBULATORY_CARE_PROVIDER_SITE_OTHER): Payer: Medicaid Other | Admitting: Family Medicine

## 2013-07-06 VITALS — BP 125/60 | Wt 198.9 lb

## 2013-07-06 DIAGNOSIS — Z8619 Personal history of other infectious and parasitic diseases: Secondary | ICD-10-CM

## 2013-07-06 DIAGNOSIS — O10019 Pre-existing essential hypertension complicating pregnancy, unspecified trimester: Secondary | ICD-10-CM

## 2013-07-06 DIAGNOSIS — O26879 Cervical shortening, unspecified trimester: Secondary | ICD-10-CM

## 2013-07-06 DIAGNOSIS — O09899 Supervision of other high risk pregnancies, unspecified trimester: Secondary | ICD-10-CM

## 2013-07-06 DIAGNOSIS — O0990 Supervision of high risk pregnancy, unspecified, unspecified trimester: Secondary | ICD-10-CM

## 2013-07-06 DIAGNOSIS — O09219 Supervision of pregnancy with history of pre-term labor, unspecified trimester: Secondary | ICD-10-CM

## 2013-07-06 DIAGNOSIS — O0991 Supervision of high risk pregnancy, unspecified, first trimester: Secondary | ICD-10-CM

## 2013-07-06 DIAGNOSIS — O169 Unspecified maternal hypertension, unspecified trimester: Secondary | ICD-10-CM

## 2013-07-06 DIAGNOSIS — IMO0001 Reserved for inherently not codable concepts without codable children: Secondary | ICD-10-CM

## 2013-07-06 LAB — CULTURE, STREPTOCOCCUS GRP B W/SUSCEPT

## 2013-07-06 LAB — POCT URINALYSIS DIP (DEVICE)
Bilirubin Urine: NEGATIVE
Glucose, UA: NEGATIVE mg/dL
HGB URINE DIPSTICK: NEGATIVE
Ketones, ur: NEGATIVE mg/dL
Nitrite: NEGATIVE
PH: 7 (ref 5.0–8.0)
PROTEIN: 30 mg/dL — AB
Specific Gravity, Urine: 1.02 (ref 1.005–1.030)
UROBILINOGEN UA: 2 mg/dL — AB (ref 0.0–1.0)

## 2013-07-06 NOTE — Progress Notes (Signed)
pulse 78 Patient reports pelvic pressure and sharp vaginal pains; also reports pain in the back of her legs earlier this week making it difficult to walk; also reporting irregular contractions

## 2013-07-06 NOTE — Progress Notes (Signed)
S: 28 yo [id number] here for ROBV.  +FM - no lof, vb.  - irregular ctx.   O: see flowsheet  A/P - doing well - labor precautions discussed - will likely need induction at 40 wks if not delivered due to hx of cHTN not on meds.   - NST reviewed. Cat I tracing.

## 2013-07-06 NOTE — Patient Instructions (Signed)
Third Trimester of Pregnancy  The third trimester is from week 29 through week 42, months 7 through 9. The third trimester is a time when the fetus is growing rapidly. At the end of the ninth month, the fetus is about 20 inches in length and weighs 6 10 pounds.   BODY CHANGES  Your body goes through many changes during pregnancy. The changes vary from woman to woman.    Your weight will continue to increase. You can expect to gain 25 35 pounds (11 16 kg) by the end of the pregnancy.   You may begin to get stretch marks on your hips, abdomen, and breasts.   You may urinate more often because the fetus is moving lower into your pelvis and pressing on your bladder.   You may develop or continue to have heartburn as a result of your pregnancy.   You may develop constipation because certain hormones are causing the muscles that push waste through your intestines to slow down.   You may develop hemorrhoids or swollen, bulging veins (varicose veins).   You may have pelvic pain because of the weight gain and pregnancy hormones relaxing your joints between the bones in your pelvis. Back aches may result from over exertion of the muscles supporting your posture.   Your breasts will continue to grow and be tender. A yellow discharge may leak from your breasts called colostrum.   Your belly button may stick out.   You may feel short of breath because of your expanding uterus.   You may notice the fetus "dropping," or moving lower in your abdomen.   You may have a bloody mucus discharge. This usually occurs a few days to a week before labor begins.   Your cervix becomes thin and soft (effaced) near your due date.  WHAT TO EXPECT AT YOUR PRENATAL EXAMS   You will have prenatal exams every 2 weeks until week 36. Then, you will have weekly prenatal exams. During a routine prenatal visit:   You will be weighed to make sure you and the fetus are growing normally.   Your blood pressure is taken.   Your abdomen will be  measured to track your baby's growth.   The fetal heartbeat will be listened to.   Any test results from the previous visit will be discussed.   You may have a cervical check near your due date to see if you have effaced.  At around 36 weeks, your caregiver will check your cervix. At the same time, your caregiver will also perform a test on the secretions of the vaginal tissue. This test is to determine if a type of bacteria, Group B streptococcus, is present. Your caregiver will explain this further.  Your caregiver may ask you:   What your birth plan is.   How you are feeling.   If you are feeling the baby move.   If you have had any abnormal symptoms, such as leaking fluid, bleeding, severe headaches, or abdominal cramping.   If you have any questions.  Other tests or screenings that may be performed during your third trimester include:   Blood tests that check for low iron levels (anemia).   Fetal testing to check the health, activity level, and growth of the fetus. Testing is done if you have certain medical conditions or if there are problems during the pregnancy.  FALSE LABOR  You may feel small, irregular contractions that eventually go away. These are called Braxton Hicks contractions, or   false labor. Contractions may last for hours, days, or even weeks before true labor sets in. If contractions come at regular intervals, intensify, or become painful, it is best to be seen by your caregiver.   SIGNS OF LABOR    Menstrual-like cramps.   Contractions that are 5 minutes apart or less.   Contractions that start on the top of the uterus and spread down to the lower abdomen and back.   A sense of increased pelvic pressure or back pain.   A watery or bloody mucus discharge that comes from the vagina.  If you have any of these signs before the 37th week of pregnancy, call your caregiver right away. You need to go to the hospital to get checked immediately.  HOME CARE INSTRUCTIONS    Avoid all  smoking, herbs, alcohol, and unprescribed drugs. These chemicals affect the formation and growth of the baby.   Follow your caregiver's instructions regarding medicine use. There are medicines that are either safe or unsafe to take during pregnancy.   Exercise only as directed by your caregiver. Experiencing uterine cramps is a good sign to stop exercising.   Continue to eat regular, healthy meals.   Wear a good support bra for breast tenderness.   Do not use hot tubs, steam rooms, or saunas.   Wear your seat belt at all times when driving.   Avoid raw meat, uncooked cheese, cat litter boxes, and soil used by cats. These carry germs that can cause birth defects in the baby.   Take your prenatal vitamins.   Try taking a stool softener (if your caregiver approves) if you develop constipation. Eat more high-fiber foods, such as fresh vegetables or fruit and whole grains. Drink plenty of fluids to keep your urine clear or pale yellow.   Take warm sitz baths to soothe any pain or discomfort caused by hemorrhoids. Use hemorrhoid cream if your caregiver approves.   If you develop varicose veins, wear support hose. Elevate your feet for 15 minutes, 3 4 times a day. Limit salt in your diet.   Avoid heavy lifting, wear low heal shoes, and practice good posture.   Rest a lot with your legs elevated if you have leg cramps or low back pain.   Visit your dentist if you have not gone during your pregnancy. Use a soft toothbrush to brush your teeth and be gentle when you floss.   A sexual relationship may be continued unless your caregiver directs you otherwise.   Do not travel far distances unless it is absolutely necessary and only with the approval of your caregiver.   Take prenatal classes to understand, practice, and ask questions about the labor and delivery.   Make a trial run to the hospital.   Pack your hospital bag.   Prepare the baby's nursery.   Continue to go to all your prenatal visits as directed  by your caregiver.  SEEK MEDICAL CARE IF:   You are unsure if you are in labor or if your water has broken.   You have dizziness.   You have mild pelvic cramps, pelvic pressure, or nagging pain in your abdominal area.   You have persistent nausea, vomiting, or diarrhea.   You have a bad smelling vaginal discharge.   You have pain with urination.  SEEK IMMEDIATE MEDICAL CARE IF:    You have a fever.   You are leaking fluid from your vagina.   You have spotting or bleeding from your vagina.     You have severe abdominal cramping or pain.   You have rapid weight loss or gain.   You have shortness of breath with chest pain.   You notice sudden or extreme swelling of your face, hands, ankles, feet, or legs.   You have not felt your baby move in over an hour.   You have severe headaches that do not go away with medicine.   You have vision changes.  Document Released: 03/31/2001 Document Revised: 12/07/2012 Document Reviewed: 06/07/2012  [employer] Patient Information 2014 [employer].

## 2013-07-10 ENCOUNTER — Ambulatory Visit (INDEPENDENT_AMBULATORY_CARE_PROVIDER_SITE_OTHER): Payer: Medicaid Other | Admitting: *Deleted

## 2013-07-10 VITALS — BP 122/67

## 2013-07-10 DIAGNOSIS — IMO0001 Reserved for inherently not codable concepts without codable children: Secondary | ICD-10-CM

## 2013-07-10 DIAGNOSIS — O169 Unspecified maternal hypertension, unspecified trimester: Secondary | ICD-10-CM

## 2013-07-10 NOTE — Progress Notes (Signed)
P= 86  

## 2013-07-10 NOTE — Addendum Note (Signed)
Addended by: Jill SideAY, Seanna Sisler L on: 07/10/2013 05:05 PM   Modules accepted: Orders

## 2013-07-13 ENCOUNTER — Ambulatory Visit (INDEPENDENT_AMBULATORY_CARE_PROVIDER_SITE_OTHER): Payer: Medicaid Other | Admitting: Obstetrics & Gynecology

## 2013-07-13 VITALS — BP 119/66 | Wt 201.6 lb

## 2013-07-13 DIAGNOSIS — IMO0001 Reserved for inherently not codable concepts without codable children: Secondary | ICD-10-CM

## 2013-07-13 DIAGNOSIS — O169 Unspecified maternal hypertension, unspecified trimester: Secondary | ICD-10-CM

## 2013-07-13 LAB — US OB FOLLOW UP

## 2013-07-13 LAB — POCT URINALYSIS DIP (DEVICE)
BILIRUBIN URINE: NEGATIVE
Glucose, UA: NEGATIVE mg/dL
Hgb urine dipstick: NEGATIVE
KETONES UR: NEGATIVE mg/dL
LEUKOCYTES UA: NEGATIVE
Nitrite: NEGATIVE
PH: 7 (ref 5.0–8.0)
Protein, ur: NEGATIVE mg/dL
Specific Gravity, Urine: 1.015 (ref 1.005–1.030)
Urobilinogen, UA: 2 mg/dL — ABNORMAL HIGH (ref 0.0–1.0)

## 2013-07-13 NOTE — Progress Notes (Signed)
Schedule IOL 07/28/13 1930  Reactive NST.

## 2013-07-13 NOTE — Patient Instructions (Signed)
Labor Induction  Labor induction is when steps are taken to cause a pregnant woman to begin the labor process. Most women go into labor on their own between 37 weeks and 42 weeks of the pregnancy. When this does not happen or when there is a medical need, methods may be used to induce labor. Labor induction causes a pregnant woman's uterus to contract. It also causes the cervix to soften (ripen), open (dilate), and thin out (efface). Usually, labor is not induced before 39 weeks of the pregnancy unless there is a problem with the baby or mother.  Before inducing labor, your health care provider will consider a number of factors, including the following:  The medical condition of you and the baby.   How many weeks along you are.   The status of the baby's lung maturity.   The condition of the cervix.   The position of the baby.  WHAT ARE THE REASONS FOR LABOR INDUCTION? Labor may be induced for the following reasons:  The health of the baby or mother is at risk.   The pregnancy is overdue by 1 week or more.   The water breaks but labor does not start on its own.   The mother has a health condition or serious illness, such as high blood pressure, infection, placental abruption, or diabetes.  The amniotic fluid amounts are low around the baby.   The baby is distressed.  Convenience or wanting the baby to be born on a certain date is not a reason for inducing labor. WHAT METHODS ARE USED FOR LABOR INDUCTION? Several methods of labor induction may be used, such as:   Prostaglandin medicine. This medicine causes the cervix to dilate and ripen. The medicine will also start contractions. It can be taken by mouth or by inserting a suppository into the vagina.   Inserting a thin tube (catheter) with a balloon on the end into the vagina to dilate the cervix. Once inserted, the balloon is expanded with water, which causes the cervix to open.   Stripping the membranes. Your health  care provider separates amniotic sac tissue from the cervix, causing the cervix to be stretched and causing the release of a hormone called progesterone. This may cause the uterus to contract. It is often done during an office visit. You will be sent home to wait for the contractions to begin. You will then come in for an induction.   Breaking the water. Your health care provider makes a hole in the amniotic sac using a small instrument. Once the amniotic sac breaks, contractions should begin. This may still take hours to see an effect.   Medicine to trigger or strengthen contractions. This medicine is given through an IV access tube inserted into a vein in your arm.  All of the methods of induction, besides stripping the membranes, will be done in the hospital. Induction is done in the hospital so that you and the baby can be carefully monitored.  HOW LONG DOES IT TAKE FOR LABOR TO BE INDUCED? Some inductions can take up to 2 3 days. Depending on the cervix, it usually takes less time. It takes longer when you are induced early in the pregnancy or if this is your first pregnancy. If a mother is still pregnant and the induction has been going on for 2 3 days, either the mother will be sent home or a cesarean delivery will be needed. WHAT ARE THE RISKS ASSOCIATED WITH LABOR INDUCTION? Some of the risks   of induction include:   Changes in fetal heart rate, such as too high, too low, or erratic.   Fetal distress.   Chance of infection for the mother and baby.   Increased chance of having a cesarean delivery.   Breaking off (abruption) of the placenta from the uterus (rare).   Uterine rupture (very rare).  When induction is needed for medical reasons, the benefits of induction may outweigh the risks. WHAT ARE SOME REASONS FOR NOT INDUCING LABOR? Labor induction should not be done if:   It is shown that your baby does not tolerate labor.   You have had previous surgeries on your  uterus, such as a myomectomy or the removal of fibroids.   Your placenta lies very low in the uterus and blocks the opening of the cervix (placenta previa).   Your baby is not in a head-down position.   The umbilical cord drops down into the birth canal in front of the baby. This could cut off the baby's blood and oxygen supply.   You have had a previous cesarean delivery.   There are unusual circumstances, such as the baby being extremely premature.  Document Released: 08/26/2006 Document Revised: 12/07/2012 Document Reviewed: 11/03/2012 [employer] Patient Information 2014 [employer].  

## 2013-07-13 NOTE — Progress Notes (Signed)
P=82  Pt feels like she lost her mucous plug/desires cervical exam Pt again stating she cannot have IOL until 4/10- prefers pm appt due to child care - scheduled on 4/10 @ 1930.  Diane Day RNC

## 2013-07-17 ENCOUNTER — Ambulatory Visit (INDEPENDENT_AMBULATORY_CARE_PROVIDER_SITE_OTHER): Payer: Medicaid Other | Admitting: *Deleted

## 2013-07-17 VITALS — BP 133/75

## 2013-07-17 DIAGNOSIS — IMO0001 Reserved for inherently not codable concepts without codable children: Secondary | ICD-10-CM

## 2013-07-17 DIAGNOSIS — O169 Unspecified maternal hypertension, unspecified trimester: Secondary | ICD-10-CM

## 2013-07-17 NOTE — Progress Notes (Signed)
P = 87 

## 2013-07-20 ENCOUNTER — Ambulatory Visit (INDEPENDENT_AMBULATORY_CARE_PROVIDER_SITE_OTHER): Payer: Medicaid Other | Admitting: Family

## 2013-07-20 VITALS — BP 131/84 | Temp 97.6°F | Wt 204.5 lb

## 2013-07-20 DIAGNOSIS — IMO0001 Reserved for inherently not codable concepts without codable children: Secondary | ICD-10-CM

## 2013-07-20 DIAGNOSIS — N898 Other specified noninflammatory disorders of vagina: Secondary | ICD-10-CM

## 2013-07-20 DIAGNOSIS — O169 Unspecified maternal hypertension, unspecified trimester: Secondary | ICD-10-CM

## 2013-07-20 DIAGNOSIS — O26899 Other specified pregnancy related conditions, unspecified trimester: Secondary | ICD-10-CM

## 2013-07-20 LAB — POCT URINALYSIS DIP (DEVICE)
Bilirubin Urine: NEGATIVE
Glucose, UA: NEGATIVE mg/dL
Hgb urine dipstick: NEGATIVE
KETONES UR: NEGATIVE mg/dL
LEUKOCYTES UA: NEGATIVE
NITRITE: NEGATIVE
Protein, ur: 30 mg/dL — AB
SPECIFIC GRAVITY, URINE: 1.02 (ref 1.005–1.030)
Urobilinogen, UA: 1 mg/dL (ref 0.0–1.0)
pH: 7 (ref 5.0–8.0)

## 2013-07-20 LAB — US OB FOLLOW UP

## 2013-07-20 MED ORDER — METRONIDAZOLE 500 MG PO TABS: 500.0000 mg | ORAL_TABLET | Freq: Two times a day (BID) | ORAL | Status: DC

## 2013-07-20 NOTE — Progress Notes (Signed)
Reports feeling increased pressure, intermittent contractions.  No vaginal bleeding.  Reports increased white discharge.  +pelvic, increased white fluid, negative pooling, fern - negative.  Wet prep sent.  Cervix 2-3/50/high.  Labor precautions given.  On valtrex, no lesions seen.    NST today. IOL 4/10.

## 2013-07-20 NOTE — Addendum Note (Signed)
Addended byGerome Apley: ZEYFANG, LINDA L on: 07/20/2013 12:10 PM   Modules accepted: Orders

## 2013-07-20 NOTE — Progress Notes (Addendum)
P = 95    Pt denies H/A or visual disturbances 

## 2013-07-21 ENCOUNTER — Encounter (HOSPITAL_COMMUNITY): Payer: Self-pay | Admitting: *Deleted

## 2013-07-21 ENCOUNTER — Telehealth (HOSPITAL_COMMUNITY): Payer: Self-pay | Admitting: *Deleted

## 2013-07-21 LAB — WET PREP, GENITAL
CLUE CELLS WET PREP: NONE SEEN
Trich, Wet Prep: NONE SEEN
Yeast Wet Prep HPF POC: NONE SEEN

## 2013-07-21 NOTE — Telephone Encounter (Signed)
Preadmission screen  

## 2013-07-24 ENCOUNTER — Ambulatory Visit (INDEPENDENT_AMBULATORY_CARE_PROVIDER_SITE_OTHER): Payer: Medicaid Other | Admitting: *Deleted

## 2013-07-24 VITALS — BP 126/66

## 2013-07-24 DIAGNOSIS — IMO0001 Reserved for inherently not codable concepts without codable children: Secondary | ICD-10-CM

## 2013-07-24 DIAGNOSIS — O169 Unspecified maternal hypertension, unspecified trimester: Secondary | ICD-10-CM

## 2013-07-24 NOTE — Progress Notes (Signed)
P = 88    IOL on 4/10

## 2013-07-24 NOTE — Progress Notes (Signed)
NST reviewed and reactive.  Hooria Gasparini L. Harraway-Smith, M.D., FACOG    

## 2013-07-25 ENCOUNTER — Inpatient Hospital Stay (HOSPITAL_COMMUNITY)
Admission: AD | Admit: 2013-07-25 | Discharge: 2013-07-28 | DRG: 774 | Disposition: A | Discharge status: Home / Self Care | Payer: Medicaid Other | Source: Ambulatory Visit | Attending: Obstetrics & Gynecology | Admitting: Obstetrics & Gynecology

## 2013-07-25 DIAGNOSIS — Z22330 Carrier of Group B streptococcus: Secondary | ICD-10-CM

## 2013-07-25 DIAGNOSIS — O99892 Other specified diseases and conditions complicating childbirth: Secondary | ICD-10-CM | POA: Diagnosis present

## 2013-07-25 DIAGNOSIS — O9902 Anemia complicating childbirth: Secondary | ICD-10-CM | POA: Diagnosis present

## 2013-07-25 DIAGNOSIS — A6000 Herpesviral infection of urogenital system, unspecified: Secondary | ICD-10-CM | POA: Diagnosis present

## 2013-07-25 DIAGNOSIS — O99334 Smoking (tobacco) complicating childbirth: Secondary | ICD-10-CM | POA: Diagnosis present

## 2013-07-25 DIAGNOSIS — D573 Sickle-cell trait: Secondary | ICD-10-CM | POA: Diagnosis present

## 2013-07-25 DIAGNOSIS — O1002 Pre-existing essential hypertension complicating childbirth: Secondary | ICD-10-CM | POA: Diagnosis present

## 2013-07-25 DIAGNOSIS — O169 Unspecified maternal hypertension, unspecified trimester: Secondary | ICD-10-CM

## 2013-07-25 DIAGNOSIS — O4290 Premature rupture of membranes, unspecified as to length of time between rupture and onset of labor, unspecified weeks of gestation: Principal | ICD-10-CM | POA: Diagnosis present

## 2013-07-25 DIAGNOSIS — O98519 Other viral diseases complicating pregnancy, unspecified trimester: Secondary | ICD-10-CM | POA: Diagnosis present

## 2013-07-25 DIAGNOSIS — O9989 Other specified diseases and conditions complicating pregnancy, childbirth and the puerperium: Secondary | ICD-10-CM

## 2013-07-25 DIAGNOSIS — IMO0001 Reserved for inherently not codable concepts without codable children: Secondary | ICD-10-CM

## 2013-07-25 DIAGNOSIS — O09899 Supervision of other high risk pregnancies, unspecified trimester: Secondary | ICD-10-CM

## 2013-07-25 DIAGNOSIS — O26879 Cervical shortening, unspecified trimester: Secondary | ICD-10-CM

## 2013-07-25 DIAGNOSIS — O09219 Supervision of pregnancy with history of pre-term labor, unspecified trimester: Secondary | ICD-10-CM

## 2013-07-25 LAB — POCT FERN TEST: POCT Fern Test: POSITIVE

## 2013-07-25 LAB — CBC
HEMATOCRIT: 29.6 % — AB (ref 36.0–46.0)
Hemoglobin: 10.5 g/dL — ABNORMAL LOW (ref 12.0–15.0)
MCH: 26.9 pg (ref 26.0–34.0)
MCHC: 35.5 g/dL (ref 30.0–36.0)
MCV: 75.9 fL — AB (ref 78.0–100.0)
PLATELETS: 141 10*3/uL — AB (ref 150–400)
RBC: 3.9 MIL/uL (ref 3.87–5.11)
RDW: 14.3 % (ref 11.5–15.5)
WBC: 9.1 10*3/uL (ref 4.0–10.5)

## 2013-07-25 LAB — MRSA PCR SCREENING: MRSA by PCR: NEGATIVE

## 2013-07-25 MED ORDER — PENICILLIN G POTASSIUM 5000000 UNITS IJ SOLR
5.0000 10*6.[IU] | Freq: Once | INTRAVENOUS | Status: AC
  Administered 2013-07-25: 5 10*6.[IU] via INTRAVENOUS
  Filled 2013-07-25: qty 5

## 2013-07-25 MED ORDER — LACTATED RINGERS IV SOLN: 500.0000 mL | INTRAVENOUS | Status: DC | PRN

## 2013-07-25 MED ORDER — LIDOCAINE HCL (PF) 1 % IJ SOLN
30.0000 mL | INTRAMUSCULAR | Status: DC | PRN
  Filled 2013-07-25: qty 30

## 2013-07-25 MED ORDER — ONDANSETRON HCL 4 MG/2ML IJ SOLN: 4.0000 mg | Freq: Four times a day (QID) | INTRAMUSCULAR | Status: DC | PRN

## 2013-07-25 MED ORDER — IBUPROFEN 600 MG PO TABS: 600.0000 mg | ORAL_TABLET | Freq: Four times a day (QID) | ORAL | Status: DC | PRN

## 2013-07-25 MED ORDER — OXYTOCIN BOLUS FROM INFUSION: 500.0000 mL | INTRAVENOUS | Status: DC

## 2013-07-25 MED ORDER — OXYTOCIN 40 UNITS IN LACTATED RINGERS INFUSION - SIMPLE MED
1.0000 m[IU]/min | INTRAVENOUS | Status: DC
  Administered 2013-07-26: 2 m[IU]/min via INTRAVENOUS
  Filled 2013-07-25: qty 1000

## 2013-07-25 MED ORDER — LACTATED RINGERS IV SOLN
INTRAVENOUS | Status: DC
  Administered 2013-07-25: 125 mL/h via INTRAVENOUS

## 2013-07-25 MED ORDER — ACETAMINOPHEN 325 MG PO TABS
650.0000 mg | ORAL_TABLET | ORAL | Status: DC | PRN
  Administered 2013-07-25: 650 mg via ORAL
  Filled 2013-07-25: qty 2

## 2013-07-25 MED ORDER — ZOLPIDEM TARTRATE 5 MG PO TABS: 5.0000 mg | ORAL_TABLET | Freq: Every evening | ORAL | Status: DC | PRN

## 2013-07-25 MED ORDER — PENICILLIN G POTASSIUM 5000000 UNITS IJ SOLR
2.5000 10*6.[IU] | INTRAVENOUS | Status: DC
  Administered 2013-07-25: 2.5 10*6.[IU] via INTRAVENOUS
  Filled 2013-07-25 (×5): qty 2.5

## 2013-07-25 MED ORDER — CITRIC ACID-SODIUM CITRATE 334-500 MG/5ML PO SOLN: 30.0000 mL | ORAL | Status: DC | PRN

## 2013-07-25 MED ORDER — OXYCODONE-ACETAMINOPHEN 5-325 MG PO TABS: 1.0000 | ORAL_TABLET | ORAL | Status: DC | PRN

## 2013-07-25 MED ORDER — OXYTOCIN 40 UNITS IN LACTATED RINGERS INFUSION - SIMPLE MED
62.5000 mL/h | INTRAVENOUS | Status: DC
  Administered 2013-07-26: 500 mL/h via INTRAVENOUS

## 2013-07-25 NOTE — MAU Note (Signed)
Pt states she has had ? LOF since last night, gushes @ times.  Also states she has had uc's for the last month, denies bleeding.

## 2013-07-25 NOTE — Progress Notes (Signed)
Alison Coffey is a 28 y.o. [id number] at 6160w6d  admitted for PROM  Subjective:   Objective: BP 140/90  Pulse 100  Temp(Src) 98.3 F (36.8 C) (Oral)  Resp 20  Ht 5\' 6"  (1.676 m)  Wt 94.348 kg (208 lb)  BMI 33.59 kg/m2  LMP 10/19/2012      FHT:  FHR: 140 bpm, variability: moderate,  accelerations:  Present,  decelerations:  Absent UC:   irregular, not tracing well on the monitor. Patient reports contractions are about every 5-7 mins  SVE:   Dilation: 4.5 Effacement (%): 70 Station: -2 Exam by:: W.Muhammad,CNM  Labs: Lab Results  Component Value Date   WBC 9.1 07/25/2013   HGB 10.5* 07/25/2013   HCT 29.6* 07/25/2013   MCV 75.9* 07/25/2013   PLT 141* 07/25/2013    Assessment / Plan: PROM, contractions increasing at this time. Patient would like to walk before starting pitocin.   Labor: Latent phase. Will ambulate and consider pitocin if needed Preeclampsia:  NA Fetal Wellbeing:  Category I Pain Control:  Labor support without medications I/D:  n/a Anticipated MOD:  NSVD  Tawnya CrookHogan, Heather Donovan 07/25/2013, 10:03 PM

## 2013-07-25 NOTE — H&P (Signed)
Alison Coffey is a 28 y.o. female presenting for SROM on 07/24/13, uncertain of time.  Denies vaginal bleeding.  +contractions, irregular.  Pt in High Risk clinic due to chronic hypertension and history of one preterm delivery.  Hypertension has been well controlled during pregnancy, not requiring meds.  Pt also has history of herpes simplex virus II, on Valtrex since 36 weeks.  Pregnancy dated by 8 week ultrasound.   Maternal Medical History:  Reason for admission: Rupture of membranes.   Contractions: Onset was less than 1 hour ago.   Frequency: irregular.    Fetal activity: Perceived fetal activity is normal.   Last perceived fetal movement was within the past hour.    Prenatal complications: PIH.   No pre-eclampsia or substance abuse.   HSVII    OB History   Grav Para Term Preterm Abortions TAB SAB Ect Mult Living   10 4 3 1 5 5    4      Past Medical History  Diagnosis Date  . Hypertension   . Pregnancy induced hypertension   . Anemia   . Headache(784.0)   . Depression   . Abnormal Pap smear 2005  . Sickle cell trait   . Preterm labor    Past Surgical History  Procedure Laterality Date  . Induced abortion    . Induced abortion    . Dilation and curettage of uterus     Family History: family history includes Diabetes in her maternal aunt; Hypertension in her father and mother; Sickle cell trait in her daughter. There is no history of Other. Social History:  reports that she has been smoking Cigarettes.  She has been smoking about 1.00 pack per day. She has never used smokeless tobacco. She reports that she does not drink alcohol or use illicit drugs.   Review of Systems  Gastrointestinal: Positive for abdominal pain (contractions).  Genitourinary:       Leaking of fluid  All other systems reviewed and are negative.    Dilation: 4.5 Effacement (%): 70 Station: -2 Exam by:: W.Muhammad,CNM Blood pressure 111/62, pulse 97, temperature 99 F (37.2 C), resp. rate  18, last menstrual period 10/19/2012. Maternal Exam:  Uterine Assessment: Contraction strength is moderate.  Abdomen: Estimated fetal weight is 7.5-8lbs.   Fetal presentation: vertex  Introitus: Normal vulva. Vulva is negative for lesion.  Normal vagina.  Vaginal discharge: mucusy.  Ferning test: positive.  Amniotic fluid character: clear.  Pelvis: adequate for delivery.      Fetal Exam Fetal Monitor Review: Baseline rate: 130's.  Variability: moderate (6-25 bpm).   Pattern: accelerations present.    Fetal State Assessment: Category I - tracings are normal.     Physical Exam  Constitutional: She is oriented to person, place, and time. She appears well-developed and well-nourished. No distress.  HENT:  Head: Normocephalic.  Neck: Normal range of motion. Neck supple.  Cardiovascular: Normal rate, regular rhythm and normal heart sounds.   Respiratory: Effort normal and breath sounds normal.  GI: Soft. There is no tenderness.  Genitourinary: Vulva exhibits no lesion. No bleeding around the vagina. Vaginal discharge: mucusy.  Musculoskeletal: Normal range of motion. Edema: trace bilat pedal.  Neurological: She is alert and oriented to person, place, and time.  Skin: Skin is warm and dry.    Prenatal labs: ABO, Rh: O/POS/-- (08/28 1249) Antibody: NEG (08/28 1249) Rubella: 2.51 (08/28 1249) RPR: NON REAC (01/15 1038)  HBsAg: NEGATIVE (08/28 1249)  HIV: NON REACTIVE (01/15 1038)  GBS: Positive (  03/12 0000)   Assessment/Plan: 28 yo [id number] at [redacted]w[redacted]d wks IUP Chronic Hypertension - Well Controlled GBS pos SROM HSV II - No lesions  Plan: Admit to Birthing Suites  Pitocin augmentation PCN for GBS Anticipate NSVD  [facility] 07/25/2013, 6:38 PM

## 2013-07-26 ENCOUNTER — Encounter (HOSPITAL_COMMUNITY): Payer: Self-pay | Admitting: *Deleted

## 2013-07-26 DIAGNOSIS — O4290 Premature rupture of membranes, unspecified as to length of time between rupture and onset of labor, unspecified weeks of gestation: Secondary | ICD-10-CM

## 2013-07-26 DIAGNOSIS — O139 Gestational [pregnancy-induced] hypertension without significant proteinuria, unspecified trimester: Secondary | ICD-10-CM

## 2013-07-26 LAB — CBC
HCT: 27 % — ABNORMAL LOW (ref 36.0–46.0)
Hemoglobin: 9.6 g/dL — ABNORMAL LOW (ref 12.0–15.0)
MCH: 27 pg (ref 26.0–34.0)
MCHC: 35.6 g/dL (ref 30.0–36.0)
MCV: 75.8 fL — AB (ref 78.0–100.0)
PLATELETS: 131 10*3/uL — AB (ref 150–400)
RBC: 3.56 MIL/uL — AB (ref 3.87–5.11)
RDW: 14.2 % (ref 11.5–15.5)
WBC: 9.7 10*3/uL (ref 4.0–10.5)

## 2013-07-26 LAB — RPR

## 2013-07-26 MED ORDER — BENZOCAINE-MENTHOL 20-0.5 % EX AERO: 1.0000 "application " | INHALATION_SPRAY | CUTANEOUS | Status: DC | PRN

## 2013-07-26 MED ORDER — ONDANSETRON HCL 4 MG/2ML IJ SOLN: 4.0000 mg | INTRAMUSCULAR | Status: DC | PRN

## 2013-07-26 MED ORDER — PRENATAL MULTIVITAMIN CH
1.0000 | ORAL_TABLET | Freq: Every day | ORAL | Status: DC
  Administered 2013-07-26 – 2013-07-27 (×2): 1 via ORAL
  Filled 2013-07-26 (×2): qty 1

## 2013-07-26 MED ORDER — SIMETHICONE 80 MG PO CHEW: 80.0000 mg | CHEWABLE_TABLET | ORAL | Status: DC | PRN

## 2013-07-26 MED ORDER — IBUPROFEN 600 MG PO TABS
600.0000 mg | ORAL_TABLET | Freq: Four times a day (QID) | ORAL | Status: DC
  Administered 2013-07-26 – 2013-07-27 (×4): 600 mg via ORAL
  Filled 2013-07-26 (×4): qty 1

## 2013-07-26 MED ORDER — LANOLIN HYDROUS EX OINT: TOPICAL_OINTMENT | CUTANEOUS | Status: DC | PRN

## 2013-07-26 MED ORDER — WITCH HAZEL-GLYCERIN EX PADS: 1.0000 "application " | MEDICATED_PAD | CUTANEOUS | Status: DC | PRN

## 2013-07-26 MED ORDER — ONDANSETRON HCL 4 MG PO TABS
4.0000 mg | ORAL_TABLET | ORAL | Status: DC | PRN
Start: 2013-07-26 — End: 2013-07-28

## 2013-07-26 MED ORDER — SENNOSIDES-DOCUSATE SODIUM 8.6-50 MG PO TABS
2.0000 | ORAL_TABLET | ORAL | Status: DC
  Filled 2013-07-26 (×2): qty 2

## 2013-07-26 MED ORDER — OXYCODONE-ACETAMINOPHEN 5-325 MG PO TABS: 1.0000 | ORAL_TABLET | ORAL | Status: DC | PRN

## 2013-07-26 MED ORDER — TETANUS-DIPHTH-ACELL PERTUSSIS 5-2.5-18.5 LF-MCG/0.5 IM SUSP: 0.5000 mL | Freq: Once | INTRAMUSCULAR | Status: DC

## 2013-07-26 MED ORDER — DIPHENHYDRAMINE HCL 25 MG PO CAPS: 25.0000 mg | ORAL_CAPSULE | Freq: Four times a day (QID) | ORAL | Status: DC | PRN

## 2013-07-26 MED ORDER — DIBUCAINE 1 % RE OINT: 1.0000 "application " | TOPICAL_OINTMENT | RECTAL | Status: DC | PRN

## 2013-07-26 MED ORDER — ZOLPIDEM TARTRATE 5 MG PO TABS: 5.0000 mg | ORAL_TABLET | Freq: Every evening | ORAL | Status: DC | PRN

## 2013-07-26 NOTE — Progress Notes (Signed)
Ur chart review completed.  

## 2013-07-26 NOTE — Progress Notes (Signed)
Patient was referred for history of depression/anxiety. * Referral screened out by Clinical Social Worker because none of the following criteria appear to apply:  ~ History of anxiety/depression during this pregnancy, or of post-partum depression.  ~ Diagnosis of anxiety and/or depression within last 3 years, per pt.  ~ History of depression due to pregnancy loss/loss of child  OR * Patient's symptoms currently being treated with medication and/or therapy.  Please contact the Clinical Social Worker if needs arise, or by the patient's request.  

## 2013-07-26 NOTE — Lactation Note (Signed)
This note was copied from the chart of [facility]. Lactation Consultation Note  Patient Name: Boy Penny PiaJessica Lefever BJYNW'GToday's Date: 07/26/2013 Reason for consult: Initial assessment  Infant has breastfed x4 (5-30 min) with LS-8 by RN; voids-3; stools-1.  Mom is experienced breastfeeder with 4 previous children with longest experience lasting 1 year.  Infant showing feeding cues upon entering room but mom stated the infant just had a spitty episode and mom said she wanted the infant's "stomach to settle" as was suggested by the RN per mom before feeding again.  Infant was sucking on fist and rooting.  Discussed feeding cues.  Lactation information given and informed of hospital support group along with community support; informed of outpatient services.  Encouraged to call for assistance as needed.    Maternal Data Formula Feeding for Exclusion: No Infant to breast within first hour of birth: Yes Has patient been taught Hand Expression?: Yes (patient states knowing how to hand express and denied needing review) Does the patient have breastfeeding experience prior to this delivery?: Yes  Feeding    LATCH Score/Interventions                      Lactation Tools Discussed/Used WIC Program: No (stated she has not signed up for it)   Consult Status Consult Status: Follow-up Date: 07/27/13 Follow-up type: In-patient    Claiborne RiggStephanie Walker Abdelaziz Westenberger 07/26/2013, 4:19 PM

## 2013-07-27 ENCOUNTER — Encounter (HOSPITAL_COMMUNITY): Payer: Self-pay | Admitting: *Deleted

## 2013-07-27 NOTE — Progress Notes (Signed)
Post Partum Day 1 Subjective: Reports occasional abdominal cramping. Up ad lib, voiding and tolerating PO. Reduced VB. Had BM x 1.  Objective: Blood pressure 130/83, pulse 61, temperature 97.4 F (36.3 C), temperature source Oral, resp. rate 17, height 5\' 6"  (1.676 m), weight 94.348 kg (208 lb), last menstrual period 10/19/2012, SpO2 99.00%, unknown if currently breastfeeding.  Physical Exam:  General: alert and cooperative Lochia: appropriate  Uterine Fundus: firm, U -2 DVT Evaluation: No evidence of DVT seen on physical exam. Negative Homan's sign. No cords or calf tenderness. No significant calf/ankle edema.   Recent Labs  07/25/13 1900 07/26/13 0715  HGB 10.5* 9.6*  HCT 29.6* 27.0*    Assessment/Plan: Plan for discharge tomorrow, Breastfeeding and Contraception Mirena - Would like circumcision for baby outpatient at peds office - Pending discharge plans for baby, patient may be agreeable to early discharge today > 24 hours   LOS: 2 days   Saralyn PilarAlexander Karamalegos 07/27/2013, 7:22 AM

## 2013-07-28 ENCOUNTER — Inpatient Hospital Stay (HOSPITAL_COMMUNITY): Admission: RE | Admit: 2013-07-28 | Payer: Medicaid Other | Source: Ambulatory Visit

## 2013-07-28 MED ORDER — IBUPROFEN 600 MG PO TABS: 600.0000 mg | ORAL_TABLET | Freq: Four times a day (QID) | ORAL | Status: DC

## 2013-07-28 NOTE — Discharge Instructions (Signed)

## 2013-07-28 NOTE — Lactation Note (Addendum)
This note was copied from the [facility] Lactation Consultation Note Experienced BF. Not having any difficulties. RN gave DEBP to Pt. To help stimulate BF more. Resting soundly at this time. Mom breast and bottle formula. Patient Name: Boy Penny PiaJessica Jakes WUJWJ'XToday's Date: 07/28/2013     Maternal Data    Feeding Feeding Type: Bottle Fed - Breast Milk  LATCH Score/Interventions                      Lactation Tools Discussed/Used     Consult Status      Charyl DancerLaura G Nolberto Cheuvront 07/28/2013, 5:51 AM

## 2013-07-28 NOTE — Discharge Summary (Signed)
Obstetric Discharge Summary Reason for Admission: onset of labor and rupture of membranes Prenatal Procedures: none Intrapartum Procedures: spontaneous vaginal delivery Postpartum Procedures: none Complications-Operative and Postpartum: none  Hospital Course: Mrs. Alison Coffey is a 28 y.o., [id number], 121w0d admitted for SROM. She delivered a healthy baby boy on 07/26/2013 at 0238. Patient is doing well, up ad lib, tolerating PO, voiding, having BM. Vaginal bleeding has continued to back down. Mrs. Alison Coffey [facility]. She is breastfeeding supplementing with bottle. Circumcision will be done at her outpatient with her regular care pediatrician.  Plan is to d/c home today.  Delivery Note At 2:38 AM a viable female was delivered via (Presentation: LOA). APGAR: 9 , 10 ; weight - pending.  Placenta status: intact, spontaneous. Cord: 3 vessel with the following complications: None. Cord pH: N/A.  Anesthesia: None  Episiotomy: None  Lacerations: None  Suture Repair: N/A  Est. Blood Loss (mL): 450 mL  Mom to postpartum. Baby to Couplet care / Skin to Skin.  Forebag ruptured with meconium stained fluid. Patient progressed quickly and delivered over intact perineum. Mild PPH treated with Pitocin and Cytotec 800 mcg. Bleeding controlled prior to leaving the room.  Alison Coffey 07/28/2013, 5:56 AM     H/H: Lab Results  Component Value Date/Time   HGB 9.6* 07/26/2013  7:15 AM   HCT 27.0* 07/26/2013  7:15 AM    Filed Vitals:   07/28/13 0545  BP: 120/77  Pulse: 69  Temp: 98.5 F (36.9 C)  Resp: 18    Physical Exam: VSS NAD Abd: Appropriately tender, ND, Fundus @U -firm Incision: none No c/c/e, Neg homan's sign, neg cords Lochia Appropriate  Discharge Diagnoses: Term Pregnancy-delivered  Discharge Information: Date: 10/30/2010 Activity: pelvic rest Diet: routine  Medications: None Breast feeding:  Yes Condition: stable Instructions: refer to handout Discharge to: home       Medication List    ASK your doctor about these medications       acyclovir 400 MG tablet  Commonly known as:  ZOVIRAX  Take 400 mg by mouth daily.     prenatal multivitamin Tabs tablet  Take 1 tablet by mouth daily at 12 noon.     valACYclovir 1000 MG tablet  Commonly known as:  VALTREX  Take 1 tablet (1,000 mg total) by mouth daily.         Alison Coffey 07/28/2013,5:56 AM

## 2013-07-28 NOTE — Lactation Note (Signed)
This note was copied from the chart of [facility] Lactation Consultation Note  Patient Name: Boy Penny PiaJessica Loken ZOXWR'UToday's Date: 07/28/2013 Reason for consult: Follow-up assessment  Upon entering room mom was on phone (personal call) eating breakfast while infant was in crib crying.  LC waiting for mom to get off phone while pulling up chart on computer.  [facility] and mom changed infant's diaper.  Mom was very short with answers and rolled eyes.  When asked about nighttime feedings and pumpings mom was again very short and abrupt with answers.  Mom stated she was sore and RN had given her comfort gels but mom was asking for another pair for discharge.  When LC explained the comfort gels were good for 1 week she curtly answered that she would "just ask [her nurse]".  LC gave mom another pair of comfort gels and educated about engorgement prevention.  Mom has a pump at home for home use.  LC thanked mom and left room.   Comfort (Breast/Nipple): Filling, red/small blisters or bruises, mild/mod discomfort  Problem noted: Mild/Moderate discomfort Interventions (Mild/moderate discomfort): Comfort gels    Consult Status Consult Status: Complete    Lendon KaStephanie Walker Aiana Nordquist 07/28/2013, 10:35 AM

## 2013-07-28 NOTE — Discharge Summary (Signed)
Patient seen and examined.  Agree with above note.  Levie HeritageJacob J Cort Dragoo, DO 07/28/2013 7:30 AM

## 2013-07-31 ENCOUNTER — Encounter (HOSPITAL_COMMUNITY): Payer: Self-pay | Admitting: *Deleted

## 2013-07-31 ENCOUNTER — Inpatient Hospital Stay (HOSPITAL_COMMUNITY)
Admission: AD | Admit: 2013-07-31 | Discharge: 2013-08-02 | DRG: 776 | Disposition: A | Discharge status: Home / Self Care | Payer: Medicaid Other | Source: Ambulatory Visit | Attending: Obstetrics & Gynecology | Admitting: Obstetrics & Gynecology

## 2013-07-31 ENCOUNTER — Telehealth: Payer: Self-pay | Admitting: General Practice

## 2013-07-31 DIAGNOSIS — O99335 Smoking (tobacco) complicating the puerperium: Secondary | ICD-10-CM | POA: Diagnosis present

## 2013-07-31 DIAGNOSIS — O9081 Anemia of the puerperium: Secondary | ICD-10-CM | POA: Diagnosis present

## 2013-07-31 DIAGNOSIS — O1415 Severe pre-eclampsia, complicating the puerperium: Principal | ICD-10-CM | POA: Diagnosis present

## 2013-07-31 DIAGNOSIS — O1495 Unspecified pre-eclampsia, complicating the puerperium: Secondary | ICD-10-CM | POA: Diagnosis present

## 2013-07-31 DIAGNOSIS — D649 Anemia, unspecified: Secondary | ICD-10-CM | POA: Diagnosis present

## 2013-07-31 LAB — PROTEIN / CREATININE RATIO, URINE
CREATININE, URINE: 71.33 mg/dL
PROTEIN CREATININE RATIO: 0.15 (ref 0.00–0.15)
Total Protein, Urine: 10.9 mg/dL

## 2013-07-31 LAB — CBC
HEMATOCRIT: 30.1 % — AB (ref 36.0–46.0)
Hemoglobin: 10.6 g/dL — ABNORMAL LOW (ref 12.0–15.0)
MCH: 26.9 pg (ref 26.0–34.0)
MCHC: 35.2 g/dL (ref 30.0–36.0)
MCV: 76.4 fL — ABNORMAL LOW (ref 78.0–100.0)
PLATELETS: 224 10*3/uL (ref 150–400)
RBC: 3.94 MIL/uL (ref 3.87–5.11)
RDW: 14.6 % (ref 11.5–15.5)
WBC: 5.6 10*3/uL (ref 4.0–10.5)

## 2013-07-31 LAB — COMPREHENSIVE METABOLIC PANEL
ALBUMIN: 2.9 g/dL — AB (ref 3.5–5.2)
ALK PHOS: 111 U/L (ref 39–117)
ALT: 13 U/L (ref 0–35)
AST: 18 U/L (ref 0–37)
BUN: 5 mg/dL — AB (ref 6–23)
CO2: 25 mEq/L (ref 19–32)
Calcium: 8.4 mg/dL (ref 8.4–10.5)
Chloride: 105 mEq/L (ref 96–112)
Creatinine, Ser: 0.61 mg/dL (ref 0.50–1.10)
GFR calc Af Amer: >90 mL/min (ref >90)
GFR calc non Af Amer: >90 mL/min (ref >90)
Glucose, Bld: 86 mg/dL (ref 70–99)
POTASSIUM: 3.2 meq/L — AB (ref 3.7–5.3)
Sodium: 142 mEq/L (ref 137–147)
TOTAL PROTEIN: 6.6 g/dL (ref 6.0–8.3)
Total Bilirubin: 0.6 mg/dL (ref 0.3–1.2)

## 2013-07-31 LAB — MRSA PCR SCREENING: MRSA by PCR: NEGATIVE

## 2013-07-31 MED ORDER — IBUPROFEN 800 MG PO TABS
800.0000 mg | ORAL_TABLET | Freq: Once | ORAL | Status: AC
  Administered 2013-07-31: 800 mg via ORAL
  Filled 2013-07-31: qty 1

## 2013-07-31 MED ORDER — ZOLPIDEM TARTRATE 5 MG PO TABS: 5.0000 mg | ORAL_TABLET | Freq: Every evening | ORAL | Status: DC | PRN

## 2013-07-31 MED ORDER — MAGNESIUM SULFATE 40 G IN LACTATED RINGERS - SIMPLE
2.0000 g/h | INTRAVENOUS | Status: AC
  Administered 2013-07-31: 2 g/h via INTRAVENOUS
  Filled 2013-07-31: qty 500

## 2013-07-31 MED ORDER — AMLODIPINE BESYLATE 10 MG PO TABS
10.0000 mg | ORAL_TABLET | Freq: Every day | ORAL | Status: DC
  Administered 2013-07-31: 10 mg via ORAL
  Filled 2013-07-31 (×2): qty 1

## 2013-07-31 MED ORDER — DOCUSATE SODIUM 100 MG PO CAPS
100.0000 mg | ORAL_CAPSULE | Freq: Every day | ORAL | Status: DC
  Filled 2013-07-31: qty 1

## 2013-07-31 MED ORDER — LACTATED RINGERS IV SOLN
INTRAVENOUS | Status: DC
  Administered 2013-07-31 – 2013-08-01 (×2): via INTRAVENOUS

## 2013-07-31 MED ORDER — LABETALOL HCL 5 MG/ML IV SOLN: 10.0000 mg | INTRAVENOUS | Status: DC | PRN

## 2013-07-31 MED ORDER — LABETALOL HCL 5 MG/ML IV SOLN
10.0000 mg | INTRAVENOUS | Status: DC | PRN
Start: 2013-07-31 — End: 2013-08-02
  Administered 2013-07-31: 10 mg via INTRAVENOUS
  Filled 2013-07-31: qty 4

## 2013-07-31 MED ORDER — CALCIUM CARBONATE ANTACID 500 MG PO CHEW: 2.0000 | CHEWABLE_TABLET | ORAL | Status: DC | PRN

## 2013-07-31 MED ORDER — MAGNESIUM SULFATE BOLUS VIA INFUSION
4.0000 g | Freq: Once | INTRAVENOUS | Status: AC
  Administered 2013-07-31: 4 g via INTRAVENOUS
  Filled 2013-07-31: qty 500

## 2013-07-31 MED ORDER — PRENATAL MULTIVITAMIN CH
1.0000 | ORAL_TABLET | Freq: Every day | ORAL | Status: DC
  Administered 2013-08-01: 1 via ORAL
  Filled 2013-07-31: qty 1

## 2013-07-31 MED ORDER — ACETAMINOPHEN 325 MG PO TABS
650.0000 mg | ORAL_TABLET | ORAL | Status: DC | PRN
  Administered 2013-07-31 – 2013-08-02 (×5): 650 mg via ORAL
  Filled 2013-07-31 (×5): qty 2

## 2013-07-31 NOTE — Progress Notes (Signed)
Pt understands has to have someone with her to care for baby while in AICU. Understanding voiced

## 2013-07-31 NOTE — MAU Provider Note (Signed)
  History     CSN: [id number]  Arrival date and time: 07/31/13 1534   First Provider Initiated Contact with Patient 07/31/13 1648     Chief Complaint  Patient presents with  . Headache   HPI 28 year old [id number] s/p NSVD on 4/8 presents to the MAU with elevated BP.    Patient with history of GHTN.  She reports that she was taking her baby to see the pediatrician when she developed severe frontal headache and associated blurry vision.  Her BP was taken at the Pediatricians office and was elevated at 160/90's per patient report.  No associated fever, chills, nausea, vomiting.  No other complaints currently.    Past Medical History  Diagnosis Date  . Hypertension   . Pregnancy induced hypertension   . Anemia   . Headache(784.0)   . Depression   . Abnormal Pap smear 2005  . Sickle cell trait   . Preterm labor     Past Surgical History  Procedure Laterality Date  . Induced abortion    . Induced abortion    . Dilation and curettage of uterus      Family History  Problem Relation Age of Onset  . Other Neg Hx   . Hypertension Mother   . Hypertension Father   . Diabetes Maternal Aunt   . Sickle cell trait Daughter     History  Substance Use Topics  . Smoking status: Current Every Day Smoker -- 1.00 packs/day    Types: Cigarettes  . Smokeless tobacco: Never Used  . Alcohol Use: No     Comment: social    Allergies: Not on File  Prescriptions prior to admission  Medication Sig Dispense Refill  . ibuprofen (ADVIL,MOTRIN) 600 MG tablet Take 1 tablet (600 mg total) by mouth every 6 (six) hours.  30 tablet  0  . Prenatal Vit-Fe Fumarate-FA (PRENATAL MULTIVITAMIN) TABS tablet Take 1 tablet by mouth daily at 12 noon.        ROS Per HPI Physical Exam   Blood pressure 173/93, pulse 62, temperature 99 F (37.2 C), temperature source Oral, resp. rate 16, last menstrual period 10/19/2012, SpO2 100.00%, unknown if currently breastfeeding.  Physical Exam Gen: well  appearing female in NAD.   Heart: RRR.  Lungs: CTAB.  Abd: gravid but otherwise soft, nontender to palpation Ext: no appreciable lower extremity edema bilaterally Neuro: No focal deficits.   MAU Course  Procedures  MDM Labs: CBC, CMP, UPC  Assessment and Plan  28 year old Alison Coffey s/p NSVD on 4/8 presents to the MAU with headache, blurry vision, and elevated BP.   - History and BP (173/93) concerning for Pre-Eclampsia in the Postpartum period - Treating headache with Motrin and obtaining labs (CBC, CMP, UPC)  - Treating BP with Norvasc 10 mg  1730  - BP increasing (195/111) - Labs negative - Case discussed with Dr. Ike Benedom (OB fellow) and attending Dr. Erin FullingHarraway-Smith who recommended admission and tx with MagSO4 and antihypertensives. Tommie SamsJayce G Cook 07/31/2013, 4:54 PM   I spoke with and examined patient and agree with resident's note and plan of care.  Tawana ScaleMichael Ryan Quantasia Stegner, MD OB Fellow 07/31/2013 11:21 PM

## 2013-07-31 NOTE — MAU Note (Signed)
Patient states she had a vaginal delivery on 4-8. States she started having a headache last night then again today. Took the baby to the pediatrician office and had her BP taken there. Was 160/98. States she continues to have a headache and blurred vision.

## 2013-07-31 NOTE — MAU Note (Signed)
[facility]

## 2013-07-31 NOTE — MAU Note (Signed)
Pt states h/a started when took NB to pediatrician for wt ck. Had some blurry vision. Vision better but still has h/a. Has not taken anything for pain today. States if can have ibuprofen 800mg  will be fine. Told has script for Ibu 600mg  at her pharmacy may pick up at her convenience.

## 2013-07-31 NOTE — MAU Note (Signed)
Pt removed B/P cuff to feed NB

## 2013-07-31 NOTE — H&P (Signed)
Postpartum H&P  HPI: Alison Coffey is a 28 y.o. [id number] s/p NSVD on 4/8 presents to the MAU with elevated BP.  Patient has a known history of GHTN (affecting last pregnancy postpartum).   She reports that she was taking her baby to see the pediatrician when she developed severe frontal headache and associated blurry vision. Her BP was taken at the Pediatricians office and was elevated at 160/90's per patient report. No associated fever, chills, nausea, vomiting. No other complaints currently.   CBC, CMP and UPC were obtained in the MAU (see results below) and were unremarkable.  However, BP continued to rise, most recently 195/111.   Patient Active Problem List   Diagnosis Date Noted  . Preeclampsia in postpartum period 07/31/2013  . Active labor 07/25/2013  . Short cervix affecting pregnancy 06/08/2013  . History of preterm delivery, currently pregnant 12/15/2012  . Supervision of high risk pregnancy in first trimester 12/15/2012  . Hypertension complicating pregnancy, childbirth and puerperium, antepartum 12/15/2012  . 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 Hypertension   . Pregnancy induced hypertension   . Anemia   . Headache(784.0)   . Depression   . Abnormal Pap smear 2005  . Sickle cell trait   . Preterm labor     Past Surgical History: Past Surgical History  Procedure Laterality Date  . Induced abortion    . Induced abortion    . Dilation and curettage of uterus      Obstetrical History: OB History   Grav Para Term Preterm Abortions TAB SAB Ect Mult Living   10 5 4 1 5 5    5      Social History: History   Social History  . Marital Status: Single    Spouse Name: N/A    Number of Children: N/A  . Years of Education: N/A   Social History Main Topics  . Smoking status: Current Every Day Smoker -- 1.00 packs/day    Types: Cigarettes  . Smokeless tobacco: Never Used  .  Alcohol Use: No     Comment: social  . Drug Use: No  . Sexual Activity: Yes    Birth Control/ Protection: None     Comment: pregnant   Other Topics Concern  . None   Social History Narrative  . None    Family History: Family History  Problem Relation Age of Onset  . Other Neg Hx   . Hypertension Mother   . Hypertension Father   . Diabetes Maternal Aunt   . Sickle cell trait Daughter     Allergies: No Known Allergies  Prescriptions prior to admission  Medication Sig Dispense Refill  . ibuprofen (ADVIL,MOTRIN) 600 MG tablet Take 1 tablet (600 mg total) by mouth every 6 (six) hours.  30 tablet  0  . Prenatal Vit-Fe Fumarate-FA (PRENATAL MULTIVITAMIN) TABS tablet Take 1 tablet by mouth daily at 12 noon.        Review of Systems - Per HPI  Vitals:  BP 195/111  Pulse 65  Temp(Src) 99 F (37.2 C) (Oral)  Resp 16  SpO2 100%  LMP 10/19/2012 Physical Examination: Gen: well appearing female in NAD.  Heart: RRR.  Lungs: CTAB.  Abd: gravid but otherwise soft, nontender to palpation  Ext: no appreciable lower extremity edema bilaterally  Neuro: No focal deficits. 2+ DTR, not brisk  Labs:  Results for orders placed during the hospital encounter of 07/31/13 (  from the past 24 hour(s))  PROTEIN / CREATININE RATIO, URINE   Collection Time    07/31/13  4:10 PM      Result Value Ref Range   Creatinine, Urine 71.33     Total Protein, Urine 10.9     PROTEIN CREATININE RATIO 0.15  0.00 - 0.15  CBC   Collection Time    07/31/13  4:30 PM      Result Value Ref Range   WBC 5.6  4.0 - 10.5 K/uL   RBC 3.94  3.87 - 5.11 MIL/uL   Hemoglobin 10.6 (*) 12.0 - 15.0 g/dL   HCT 11.930.1 (*) 14.736.0 - 82.946.0 %   MCV 76.4 (*) 78.0 - 100.0 fL   MCH 26.9  26.0 - 34.0 pg   MCHC 35.2  30.0 - 36.0 g/dL   RDW 56.214.6  13.011.5 - 86.515.5 %   Platelets 224  150 - 400 K/uL  COMPREHENSIVE METABOLIC PANEL   Collection Time    07/31/13  4:30 PM      Result Value Ref Range   Sodium 142  137 - 147 mEq/L    Potassium 3.2 (*) 3.7 - 5.3 mEq/L   Chloride 105  96 - 112 mEq/L   CO2 25  19 - 32 mEq/L   Glucose, Bld 86  70 - 99 mg/dL   BUN 5 (*) 6 - 23 mg/dL   Creatinine, Ser 7.840.61  0.50 - 1.10 mg/dL   Calcium 8.4  8.4 - 69.610.5 mg/dL   Total Protein 6.6  6.0 - 8.3 g/dL   Albumin 2.9 (*) 3.5 - 5.2 g/dL   AST 18  0 - 37 U/L   ALT 13  0 - 35 U/L   Alkaline Phosphatase 111  39 - 117 U/L   Total Bilirubin 0.6  0.3 - 1.2 mg/dL   GFR calc non Af Amer >90  >90 mL/min   GFR calc Af Amer >90  >90 mL/min    Imaging Studies: No results found.  Marland Kitchen. amLODipine  10 mg Oral Daily  . docusate sodium  100 mg Oral Daily  . magnesium  4 g Intravenous Once  . [START ON 08/01/2013] prenatal multivitamin  1 tablet Oral Q1200   ASSESSMENT/PLAN: 28 year old [id number] s/p NSVD on 4/8 presents with Post-Partum Pre-Eclampsia.  # Post-Partum Pre-Eclampsia based on BP and severe headache. - Will admit  - Starting Magnesium Sulfate 4/2 for 24hr - Treating BP with Norvasc 10 mg daily and PRN IV Labetalol; Will likely need additional PO antihypertensives on discharge.  Everlene OtherJayce Cook DO Family Medicine PGY-2  I spoke with and examined patient and agree with resident's note and plan of care.  Tawana ScaleMichael Ryan Hedda Crumbley, MD OB Fellow 07/31/2013 11:19 PM

## 2013-07-31 NOTE — MAU Note (Signed)
Dr Ike Benedom in discussing admission and then pt on phone lining up childcare.

## 2013-07-31 NOTE — Telephone Encounter (Signed)
Patient called in to front office stating she is at the doctors office with her son and started to have blurry vision and a bad headache and has high blood pressure but hasn't been on medication in pregnancy and it's usually right after she delivers that her blood pressure in a problem. States they checked her BP and it was 160/98. Told patient she needs to go to MAU for further evaluation given her symptoms and history. Patient verbalized understanding and stated okay. Patient had no further questions

## 2013-07-31 NOTE — MAU Note (Signed)
Pt smiling and talking to NB in her arms

## 2013-07-31 NOTE — Progress Notes (Signed)
States area where head hurts is smaller now and intensity is alittle less but still puts at 10

## 2013-07-31 NOTE — Progress Notes (Signed)
Pt talking on phone while trying to get pt's IV going, giving Labetalol and preparing for transfer to AICU

## 2013-07-31 NOTE — MAU Note (Signed)
Pt up to BR

## 2013-08-01 ENCOUNTER — Encounter: Payer: Self-pay | Admitting: Obstetrics & Gynecology

## 2013-08-01 MED ORDER — AMLODIPINE BESYLATE 10 MG PO TABS
10.0000 mg | ORAL_TABLET | Freq: Every day | ORAL | Status: DC
  Administered 2013-08-01 – 2013-08-02 (×2): 10 mg via ORAL
  Filled 2013-08-01 (×2): qty 1

## 2013-08-01 MED ORDER — IBUPROFEN 800 MG PO TABS
800.0000 mg | ORAL_TABLET | Freq: Once | ORAL | Status: AC
  Administered 2013-08-01: 800 mg via ORAL
  Filled 2013-08-01: qty 1

## 2013-08-01 MED ORDER — HYDROCHLOROTHIAZIDE 25 MG PO TABS: 25.0000 mg | ORAL_TABLET | Freq: Every day | ORAL | Status: DC

## 2013-08-01 MED ORDER — AMLODIPINE BESYLATE 10 MG PO TABS: 10.0000 mg | ORAL_TABLET | Freq: Every day | ORAL | Status: DC

## 2013-08-01 MED ORDER — HYDROCHLOROTHIAZIDE 25 MG PO TABS
25.0000 mg | ORAL_TABLET | Freq: Every day | ORAL | Status: DC
  Administered 2013-08-01 – 2013-08-02 (×2): 25 mg via ORAL
  Filled 2013-08-01 (×2): qty 1

## 2013-08-01 NOTE — Lactation Note (Signed)
Lactation Consultation Note  Mom visited in AICU this AM.  Baby is 216 days old and currently breastfeeding on right breast with good latch and active suck/swallows.  Mom states baby was diagnosed with short frenulum in the hospital but his weight gain is good.  Mom is resting left nipple due to soreness and pumping.  Left breast is very full and firm.  Instructed mom to pump after finishing feeding and to pump every  3 hours if not putting baby to breast.  Nipple appears healed but comfort gels given with instructions.  Mom understands if she has chronic nipple soreness tongue may need clipped.  Encouraged to call with questions/concerns.  Patient Name: Penny PiaJessica Geidel WUJWJ'XToday's Date: 08/01/2013     Maternal Data    Feeding    LATCH Score/Interventions                      Lactation Tools Discussed/Used     Consult Status      Hansel FeinsteinLaura Ann Powell 08/01/2013, 12:52 PM

## 2013-08-01 NOTE — Progress Notes (Signed)
UR chart review completed.  

## 2013-08-01 NOTE — Progress Notes (Signed)
Patient transferred to women's unit in stable condition. Ambulated to room 317 while pushing baby's bassinet. Report given to Stann OreAllycha Wehner, RN.

## 2013-08-01 NOTE — Discharge Summary (Signed)
Physician Discharge Summary  Patient ID: Alison Coffey MRN: [id number] DOB/AGE: 12/03/1985 28 y.o.  Admit date: 07/31/2013 Discharge date: 08/02/2013  Admission Diagnoses: Postpartum Pre-Eclampsia w/ severe features  Discharge Diagnoses:  Postpartum Pre-Eclampsia w/ severe features  Significant Diagnostic Studies:  Results for orders placed during the hospital encounter of 07/31/13 (from the past 168 hour(s))  PROTEIN / CREATININE RATIO, URINE   Collection Time    07/31/13  4:10 PM      Result Value Ref Range   Creatinine, Urine 71.33     Total Protein, Urine 10.9     PROTEIN CREATININE RATIO 0.15  0.00 - 0.15  CBC   Collection Time    07/31/13  4:30 PM      Result Value Ref Range   WBC 5.6  4.0 - 10.5 K/uL   RBC 3.94  3.87 - 5.11 MIL/uL   Hemoglobin 10.6 (*) 12.0 - 15.0 g/dL   HCT 13.0 (*) 86.5 - 78.4 %   MCV 76.4 (*) 78.0 - 100.0 fL   MCH 26.9  26.0 - 34.0 pg   MCHC 35.2  30.0 - 36.0 g/dL   RDW 69.6  29.5 - 28.4 %   Platelets 224  150 - 400 K/uL  COMPREHENSIVE METABOLIC PANEL   Collection Time    07/31/13  4:30 PM      Result Value Ref Range   Sodium 142  137 - 147 mEq/L   Potassium 3.2 (*) 3.7 - 5.3 mEq/L   Chloride 105  96 - 112 mEq/L   CO2 25  19 - 32 mEq/L   Glucose, Bld 86  70 - 99 mg/dL   BUN 5 (*) 6 - 23 mg/dL   Creatinine, Ser 1.32  0.50 - 1.10 mg/dL   Calcium 8.4  8.4 - 44.0 mg/dL   Total Protein 6.6  6.0 - 8.3 g/dL   Albumin 2.9 (*) 3.5 - 5.2 g/dL   AST 18  0 - 37 U/L   ALT 13  0 - 35 U/L   Alkaline Phosphatase 111  39 - 117 U/L   Total Bilirubin 0.6  0.3 - 1.2 mg/dL   GFR calc non Af Amer >90  >90 mL/min   GFR calc Af Amer >90  >90 mL/min  MRSA PCR SCREENING   Collection Time    07/31/13  6:45 PM      Result Value Ref Range   MRSA by PCR NEGATIVE  NEGATIVE  Results for orders placed during the hospital encounter of 07/25/13 (from the past 168 hour(s))  CBC   Collection Time    07/26/13  7:15 AM      Result Value Ref Range   WBC 9.7  4.0  - 10.5 K/uL   RBC 3.56 (*) 3.87 - 5.11 MIL/uL   Hemoglobin 9.6 (*) 12.0 - 15.0 g/dL   HCT 10.2 (*) 72.5 - 36.6 %   MCV 75.8 (*) 78.0 - 100.0 fL   MCH 27.0  26.0 - 34.0 pg   MCHC 35.6  30.0 - 36.0 g/dL   RDW 44.0  34.7 - 42.5 %   Platelets 131 (*) 150 - 400 K/uL    Treatments:  Antihypertensives: Norvasc 10 mg, HCTZ 25mg , PRN Labetalol, Magnesium Sulfate  Hospital Course:  Alison Coffey is a 28 y.o. [id number] s/p NSVD on 4/8 who was admitted for post-partum Pre-Eclampsia with severe features.  Patient presented with elevated BP (190's/110's) and severe frontal headache with associated vision changes.  She was treated with 24  hours of magnesium sulfate, Norvasc 10 mg, and PRN labetalol (x 1 dose on 4/13). Patient had significant diuresis (~2L) and BP's markedly improved. On 4/14, after Mag sulfate was discontinued (completed 24 hours), patient did have an increased BP to 145/108, given HCTZ 25mg  with good results and normalized BPs 130/80s. Patient agreed to remaining for continued BP monitoring overnight. At time of discharge, BP remained stable, improved persistent mild frontal HA (responds to Tylenol), continued on Amlodipine 10mg  and HCTZ 25mg  daily for close BP monitoring and titration in clinic on follow-up.  Discharge Condition: stable  Disposition: 01-Home or Self Care      Discharge Orders   Future Appointments Provider Department Dept Phone   08/30/2013 12:45 PM Willodean Rosenthal, MD [facility] [phone]   Future Orders Complete By Expires   Discharge patient  As directed        Medication List         amLODipine 10 MG tablet  Commonly known as:  NORVASC  Take 1 tablet (10 mg total) by mouth daily.     hydrochlorothiazide 25 MG tablet  Commonly known as:  HYDRODIURIL  Take 1 tablet (25 mg total) by mouth daily.     ibuprofen 600 MG tablet  Commonly known as:  ADVIL,MOTRIN  Take 1 tablet (600 mg total) by mouth every 6 (six) hours.      prenatal multivitamin Tabs tablet  Take 1 tablet by mouth daily at 12 noon.       Follow-up Information   Follow up with [facility] In 1 week. (Please schedule an appointment in 1-2 weeks for Blood Pressure re-check. Also, be sure to schedule a postpartum visit by 4-6 weeks.)    Contact information:   [address] [place] [address] [phone]      Follow up with Alm Bustard, MD In 1 month. (Establish continued primary care for Blood Pressure management outside of pregnancy.)    Specialty:  Specialist   Contact information:   [address] [place] [address] [phone]      Signed: Saralyn Pilar, DO [facility], PGY-1 08/02/2013, 6:43 AM  I have seen and examined this patient and I agree with the above. Arabella Merles 7:44 AM 08/02/2013

## 2013-08-01 NOTE — Progress Notes (Signed)
Post Partum Day 7 Subjective: no complaints.  Pt reports no HA and no visual changes. Pt reports that she has been on BP meds since age 28 years.  Objective: Blood pressure 123/78, pulse 82, temperature 98.3 F (36.8 C), temperature source Oral, resp. rate 18, height 5\' 6"  (1.676 m), weight 191 lb 14.4 oz (87.045 kg), SpO2 100.00%, unknown if currently breastfeeding.  Physical Exam:  General: alert and no distress Lochia: appropriate Uterine Fundus: firm DVT Evaluation: No evidence of DVT seen on physical exam.   Recent Labs  07/31/13 1630  HGB 10.6*  HCT 30.1*    Assessment/Plan: Pt requests discharge tonight regardless of the time Keep Magnesium for 24 hours Norvasc 10mg  q day Lactation consult for breastpump   LOS: 1 day   [facility] HospitalCarolyn Harraway-Smith 08/01/2013, 8:01 AM

## 2013-08-01 NOTE — Discharge Instructions (Signed)
Preeclampsia and Eclampsia °Preeclampsia is a condition of high blood pressure during pregnancy. It can happen at 20 weeks or later in pregnancy. If high blood pressure occurs in the second half of pregnancy with no other symptoms, it is called gestational hypertension and goes away after the baby is born. If any of the symptoms listed below develop with gestational hypertension, it is then called preeclampsia. Eclampsia (convulsions) may follow preeclampsia. This is one of the reasons for regular prenatal checkups. Early diagnosis and treatment are very important to prevent eclampsia. °CAUSES  °There is no known cause of preeclampsia/eclampsia in pregnancy. There are several known conditions that may put the pregnant woman at risk, such as: °· The first pregnancy. °· Having preeclampsia in a past pregnancy. °· Having lasting (chronic) high blood pressure. °· Having multiples (twins, triplets). °· Being age 35 or older. °· African American ethnic background. °· Having kidney disease or diabetes. °· Medical conditions such as lupus or blood diseases. °· Being overweight (obese). °SYMPTOMS  °· High blood pressure. °· Headaches. °· Sudden weight gain. °· Swelling of hands, face, legs, and feet. °· Protein in the urine. °· Feeling sick to your stomach (nauseous) and throwing up (vomiting). °· Vision problems (blurred or double vision). °· Numbness in the face, arms, legs, and feet. °· Dizziness. °· Slurred speech. °· Preeclampsia can cause growth retardation in the fetus. °· Separation (abruption) of the placenta. °· Not enough fluid in the amniotic sac (oligohydramnios). °· Sensitivity to bright lights. °· Belly (abdominal) pain. °DIAGNOSIS  °If protein is found in the urine in the second half of pregnancy, this is considered preeclampsia. Other symptoms mentioned above may also be present. °TREATMENT  °It is necessary to treat this. °· Your caregiver may prescribe bed rest early in this condition. Plenty of rest and  salt restriction may be all that is needed. °· Medicines may be necessary to lower blood pressure if the condition does not respond to more conservative measures. °· In more severe cases, hospitalization may be needed: °· For treatment of blood pressure. °· To control fluid retention. °· To monitor the baby to see if the condition is causing harm to the baby. °· Hospitalization is the best way to treat the first sign of preeclampsia. This is so the mother and baby can be watched closely and blood tests can be done effectively and correctly. °· If the condition becomes severe, it may be necessary to induce labor or to remove the infant by surgical means (cesarean section). The best cure for preeclampsia/eclampsia is to deliver the baby. °Preeclampsia and eclampsia involve risks to mother and infant. Your caregiver will discuss these risks with you. Together, you can work out the best possible approach to your problems. Make sure you keep your prenatal visits as scheduled. Not keeping appointments could result in a chronic or permanent injury, pain, disability to you, and death or injury to you or your unborn baby. If there is any problem keeping the appointment, you must call to reschedule. °HOME CARE INSTRUCTIONS  °· Keep your prenatal appointments and tests as scheduled. °· Tell your caregiver if you have any of the above risk factors. °· Get plenty of rest and sleep. °· Eat a balanced diet that is low in salt, and do not add salt to your food. °· Avoid stressful situations. °· Only take over-the-counter and prescriptions medicines for pain, discomfort, or fever as directed by your caregiver. °SEEK IMMEDIATE MEDICAL CARE IF:  °· You develop severe swelling   anywhere in the body. This usually occurs in the legs. °· You gain 05 lb/2.3 kg or more in a week. °· You develop a severe headache, dizziness, problems with your vision, or confusion. °· You have abdominal pain, nausea, or vomiting. °· You have a seizure. °· You  have trouble moving any part of your body, or you develop numbness or problems speaking. °· You have bruising or abnormal bleeding from anywhere in the body. °· You develop a stiff neck. °· You pass out. °MAKE SURE YOU:  °· Understand these instructions. °· Will watch your condition. °· Will get help right away if you are not doing well or get worse. °Document Released: 04/03/2000 Document Revised: 06/29/2011 Document Reviewed: 11/18/2007 °ExitCare® Patient Information ©2014 [employer]. ° °

## 2013-08-02 NOTE — Progress Notes (Signed)
Pt discharged home... Condition stable... No equipment... Ambulated to car with Sharyon Cable. Corbitt, RN.

## 2013-08-03 NOTE — Discharge Summary (Signed)
Attestation of Attending Supervision of Advanced Practitioner (CNM/NP): Evaluation and management procedures were performed by the Advanced Practitioner under my supervision and collaboration. I have reviewed the Advanced Practitioner's note and chart, and I agree with the management and plan.  Dereona Kolodny H Daved Mcfann 6:06 AM   

## 2013-08-03 NOTE — Progress Notes (Signed)
I have seen this patient and agree with the above resident's note.  LEFTWICH-KIRBY, Tylor Gambrill Certified Nurse-Midwife 

## 2013-08-05 NOTE — Progress Notes (Signed)
NST reactive on 07/10/13

## 2013-08-06 NOTE — MAU Provider Note (Signed)
Attestation of Attending Supervision of Advanced Practitioner (CNM/NP): Evaluation and management procedures were performed by the Advanced Practitioner under my supervision and collaboration.  I have reviewed the Advanced Practitioner's note and chart, and I agree with the management and plan.  Julie-Ann Vanmaanen Harraway-Smith 11:54 AM     

## 2013-08-06 NOTE — H&P (Signed)
Attestation of Attending Supervision of Fellow: Evaluation and management procedures were performed by the Fellow under my supervision and collaboration.  I have reviewed the Fellow's note and chart, and I agree with the management and plan.    

## 2013-08-30 ENCOUNTER — Ambulatory Visit: Payer: Medicaid Other | Admitting: Obstetrics & Gynecology

## 2013-08-30 ENCOUNTER — Telehealth: Payer: Self-pay

## 2013-08-30 NOTE — Telephone Encounter (Signed)
Pt. Missed today's PP appointment. Attempted to call pt. No answer. Left message stating we are sorry you missed your appointment, please call clinic to re-schedule. Will also send letter.

## 2013-12-15 ENCOUNTER — Encounter: Payer: Self-pay | Admitting: General Practice

## 2014-01-29 ENCOUNTER — Emergency Department (HOSPITAL_BASED_OUTPATIENT_CLINIC_OR_DEPARTMENT_OTHER)
Admission: EM | Admit: 2014-01-29 | Discharge: 2014-01-29 | Disposition: A | Discharge status: Home / Self Care | Payer: Medicaid Other | Attending: Emergency Medicine | Admitting: Emergency Medicine

## 2014-01-29 ENCOUNTER — Encounter (HOSPITAL_BASED_OUTPATIENT_CLINIC_OR_DEPARTMENT_OTHER): Payer: Self-pay | Admitting: Emergency Medicine

## 2014-01-29 DIAGNOSIS — Z3202 Encounter for pregnancy test, result negative: Secondary | ICD-10-CM | POA: Insufficient documentation

## 2014-01-29 DIAGNOSIS — Z79899 Other long term (current) drug therapy: Secondary | ICD-10-CM | POA: Insufficient documentation

## 2014-01-29 DIAGNOSIS — R109 Unspecified abdominal pain: Secondary | ICD-10-CM | POA: Diagnosis not present

## 2014-01-29 DIAGNOSIS — N390 Urinary tract infection, site not specified: Secondary | ICD-10-CM | POA: Insufficient documentation

## 2014-01-29 DIAGNOSIS — Z862 Personal history of diseases of the blood and blood-forming organs and certain disorders involving the immune mechanism: Secondary | ICD-10-CM | POA: Diagnosis not present

## 2014-01-29 DIAGNOSIS — I10 Essential (primary) hypertension: Secondary | ICD-10-CM | POA: Diagnosis not present

## 2014-01-29 DIAGNOSIS — Z792 Long term (current) use of antibiotics: Secondary | ICD-10-CM | POA: Diagnosis not present

## 2014-01-29 DIAGNOSIS — Z720 Tobacco use: Secondary | ICD-10-CM | POA: Diagnosis not present

## 2014-01-29 DIAGNOSIS — Z8659 Personal history of other mental and behavioral disorders: Secondary | ICD-10-CM | POA: Insufficient documentation

## 2014-01-29 LAB — URINALYSIS, ROUTINE W REFLEX MICROSCOPIC
GLUCOSE, UA: NEGATIVE mg/dL
Ketones, ur: 15 mg/dL — AB
Nitrite: NEGATIVE
PH: 7 (ref 5.0–8.0)
Protein, ur: 100 mg/dL — AB
SPECIFIC GRAVITY, URINE: 1.026 (ref 1.005–1.030)
Urobilinogen, UA: 2 mg/dL — ABNORMAL HIGH (ref 0.0–1.0)

## 2014-01-29 LAB — URINE MICROSCOPIC-ADD ON

## 2014-01-29 LAB — PREGNANCY, URINE: Preg Test, Ur: NEGATIVE

## 2014-01-29 MED ORDER — CEPHALEXIN 500 MG PO CAPS: 500.0000 mg | ORAL_CAPSULE | Freq: Four times a day (QID) | ORAL | Status: DC

## 2014-01-29 NOTE — ED Provider Notes (Signed)
CSN: [id number]     Arrival date & time 01/29/14  [id number] History   First MD Initiated Contact with Patient 01/29/14 [phone]     Chief Complaint  Patient presents with  . Flank Pain     (Consider location/radiation/quality/duration/timing/severity/associated sxs/prior Treatment) Patient is a 28 y.o. female presenting with flank pain. The history is provided by the patient. No language interpreter was used.  Flank Pain This is a new problem. The current episode started 6 to 12 hours ago. The problem occurs constantly. The problem has not changed since onset.Pertinent negatives include no chest pain, no abdominal pain, no headaches and no shortness of breath. Nothing aggravates the symptoms. Nothing relieves the symptoms. She has tried nothing for the symptoms. The treatment provided no relief.    Past Medical History  Diagnosis Date  . Hypertension   . Pregnancy induced hypertension   . Anemia   . Headache(784.0)   . Depression   . Abnormal Pap smear 2005  . Sickle cell trait   . Preterm labor    Past Surgical History  Procedure Laterality Date  . Induced abortion    . Induced abortion    . Dilation and curettage of uterus     Family History  Problem Relation Age of Onset  . Other Neg Hx   . Hypertension Mother   . Hypertension Father   . Diabetes Maternal Aunt   . Sickle cell trait Daughter    History  Substance Use Topics  . Smoking status: Current Every Day Smoker -- 0.50 packs/day    Types: Cigarettes  . Smokeless tobacco: Never Used  . Alcohol Use: No     Comment: social   OB History   Grav Para Term Preterm Abortions TAB SAB Ect Mult Living   10 5 4 1 5 5    5      Review of Systems  Constitutional: Negative for fever, chills, diaphoresis, activity change, appetite change and fatigue.  HENT: Negative for congestion, facial swelling, rhinorrhea and sore throat.   Eyes: Negative for photophobia and discharge.  Respiratory: Negative for cough, chest tightness and  shortness of breath.   Cardiovascular: Negative for chest pain, palpitations and leg swelling.  Gastrointestinal: Negative for nausea, vomiting, abdominal pain and diarrhea.  Endocrine: Negative for polydipsia and polyuria.  Genitourinary: Positive for flank pain. Negative for dysuria, frequency, difficulty urinating and pelvic pain.  Musculoskeletal: Negative for arthralgias, back pain, neck pain and neck stiffness.  Skin: Negative for color change and wound.  Allergic/Immunologic: Negative for immunocompromised state.  Neurological: Negative for facial asymmetry, weakness, numbness and headaches.  Hematological: Does not bruise/bleed easily.  Psychiatric/Behavioral: Negative for confusion and agitation.      Allergies  Review of patient's allergies indicates no known allergies.  Home Medications   Prior to Admission medications   Medication Sig Start Date End Date Taking? Authorizing Provider  amLODipine (NORVASC) 10 MG tablet Take 1 tablet (10 mg total) by mouth daily. 08/01/13   Tommie SamsJayce G Cook, DO  cephALEXin (KEFLEX) 500 MG capsule Take 1 capsule (500 mg total) by mouth 4 (four) times daily. For 2 days 01/29/14   Toy CookeyMegan Samanvitha Germany, MD  hydrochlorothiazide (HYDRODIURIL) 25 MG tablet Take 1 tablet (25 mg total) by mouth daily. 08/01/13   Tommie SamsJayce G Cook, DO  ibuprofen (ADVIL,MOTRIN) 600 MG tablet Take 1 tablet (600 mg total) by mouth every 6 (six) hours. 07/28/13   Levie HeritageJacob J Stinson, DO  Prenatal Vit-Fe Fumarate-FA (PRENATAL MULTIVITAMIN) TABS tablet Take 1  tablet by mouth daily at 12 noon.    Historical Provider, MD   BP 118/83  Pulse 78  Temp(Src) 98.1 F (36.7 C) (Oral)  Resp 16  Ht 5\' 6"  (1.676 m)  Wt 160 lb (72.576 kg)  BMI 25.84 kg/m2  SpO2 99%  LMP 01/26/2014  Breastfeeding? No Physical Exam  Constitutional: She is oriented to person, place, and time. She appears well-developed and well-nourished. No distress.  HENT:  Head: Normocephalic and atraumatic.  Mouth/Throat: No  oropharyngeal exudate.  Eyes: Pupils are equal, round, and reactive to light.  Neck: Normal range of motion. Neck supple.  Cardiovascular: Normal rate, regular rhythm and normal heart sounds.  Exam reveals no gallop and no friction rub.   No murmur heard. Pulmonary/Chest: Effort normal and breath sounds normal. No respiratory distress. She has no wheezes. She has no rales.  Abdominal: Soft. Bowel sounds are normal. She exhibits no distension and no mass. There is no tenderness. There is no rebound and no guarding.  Musculoskeletal: Normal range of motion. She exhibits no edema and no tenderness.  Neurological: She is alert and oriented to person, place, and time.  Skin: Skin is warm and dry.  Psychiatric: She has a normal mood and affect.    ED Course  Procedures (including critical care time) Labs Review Labs Reviewed  URINALYSIS, ROUTINE W REFLEX MICROSCOPIC - Abnormal; Notable for the following:    Color, Urine AMBER (*)    APPearance CLOUDY (*)    Hgb urine dipstick LARGE (*)    Bilirubin Urine SMALL (*)    Ketones, ur 15 (*)    Protein, ur 100 (*)    Urobilinogen, UA 2.0 (*)    Leukocytes, UA LARGE (*)    All other components within normal limits  URINE MICROSCOPIC-ADD ON - Abnormal; Notable for the following:    Squamous Epithelial / LPF FEW (*)    Bacteria, UA MANY (*)    All other components within normal limits  URINE CULTURE  PREGNANCY, URINE    Imaging Review No results found.   EKG Interpretation None      MDM   Final diagnoses:  UTI (lower urinary tract infection)  Right flank pain    Pt is a 28 y.o. female with Pmhx as above who presents with R sided flank pain, since 2am which was first described as sharp it is now dull. The pain is nonradiating and constant. She denies associated nausea, vomiting, fever, chills, dysuria or frequency.  She is currently menstruating. She denies history of kidney stones. She has had one UTI when pregnant approximately 6  months ago. On physical exam priors are stable and she is in no acute distress. She is no CVA tenderness or reproducible right flank pain. Hx not c/w ureterolithiasis. Will check UA and U preg.   Urine likely infected w/ large leukocytes, TNTC WBCs with only few epithelial cells and many bacteria. +large Hb (on menses). Will place on 7 days of keflex for UT/likely early pyelonephritis. Return precautions given for new or worsening symptoms including worsening or unresolved pain, fever, n/v, inability to tolerate PO. Would consider stone if symptoms fail to resolve or worsen.         Toy CookeyMegan Marcy Bogosian, MD 01/29/14 1041

## 2014-01-29 NOTE — ED Notes (Signed)
Sudden onset of right sided flank pain at 2am.  Denies abd pain or any urinary sx.  Denies injury.

## 2014-01-29 NOTE — Discharge Instructions (Signed)
Pyelonephritis, Adult °Pyelonephritis is a kidney infection. In general, there are 2 main types of pyelonephritis: °· Infections that come on quickly without any warning (acute pyelonephritis). °· Infections that persist for a long period of time (chronic pyelonephritis). °CAUSES  °Two main causes of pyelonephritis are: °· Bacteria traveling from the bladder to the kidney. This is a problem especially in pregnant women. The urine in the bladder can become filled with bacteria from multiple causes, including: °¨ Inflammation of the prostate gland (prostatitis). °¨ Sexual intercourse in females. °¨ Bladder infection (cystitis). °· Bacteria traveling from the bloodstream to the tissue part of the kidney. °Problems that may increase your risk of getting a kidney infection include: °· Diabetes. °· Kidney stones or bladder stones. °· Cancer. °· Catheters placed in the bladder. °· Other abnormalities of the kidney or ureter. °SYMPTOMS  °· Abdominal pain. °· Pain in the side or flank area. °· Fever. °· Chills. °· Upset stomach. °· Blood in the urine (dark urine). °· Frequent urination. °· Strong or persistent urge to urinate. °· Burning or stinging when urinating. °DIAGNOSIS  °Your caregiver may diagnose your kidney infection based on your symptoms. A urine sample may also be taken. °TREATMENT  °In general, treatment depends on how severe the infection is.  °· If the infection is mild and caught early, your caregiver may treat you with oral antibiotics and send you home. °· If the infection is more severe, the bacteria may have gotten into the bloodstream. This will require intravenous (IV) antibiotics and a hospital stay. Symptoms may include: °¨ High fever. °¨ Severe flank pain. °¨ Shaking chills. °· Even after a hospital stay, your caregiver may require you to be on oral antibiotics for a period of time. °· Other treatments may be required depending upon the cause of the infection. °HOME CARE INSTRUCTIONS  °· Take your  antibiotics as directed. Finish them even if you start to feel better. °· Make an appointment to have your urine checked to make sure the infection is gone. °· Drink enough fluids to keep your urine clear or pale yellow. °· Take medicines for the bladder if you have urgency and frequency of urination as directed by your caregiver. °SEEK IMMEDIATE MEDICAL CARE IF:  °· You have a fever or persistent symptoms for more than 2-3 days. °· You have a fever and your symptoms suddenly get worse. °· You are unable to take your antibiotics or fluids. °· You develop shaking chills. °· You experience extreme weakness or fainting. °· There is no improvement after 2 days of treatment. °MAKE SURE YOU: °· Understand these instructions. °· Will watch your condition. °· Will get help right away if you are not doing well or get worse. °Document Released: 04/06/2005 Document Revised: 10/06/2011 Document Reviewed: 09/10/2010 °ExitCare® Patient Information ©2015 [employer]. This information is not intended to replace advice given to you by your health care provider. Make sure you discuss any questions you have with your health care provider. ° °

## 2014-01-30 LAB — URINE CULTURE

## 2014-02-19 ENCOUNTER — Encounter (HOSPITAL_BASED_OUTPATIENT_CLINIC_OR_DEPARTMENT_OTHER): Payer: Self-pay | Admitting: Emergency Medicine

## 2014-03-22 ENCOUNTER — Encounter: Payer: Self-pay | Admitting: Obstetrics & Gynecology

## 2014-09-30 ENCOUNTER — Encounter (HOSPITAL_BASED_OUTPATIENT_CLINIC_OR_DEPARTMENT_OTHER): Payer: Self-pay | Admitting: Emergency Medicine

## 2014-09-30 ENCOUNTER — Emergency Department (HOSPITAL_BASED_OUTPATIENT_CLINIC_OR_DEPARTMENT_OTHER)
Admission: EM | Admit: 2014-09-30 | Discharge: 2014-09-30 | Disposition: A | Discharge status: Home / Self Care | Payer: Medicaid Other | Attending: Emergency Medicine | Admitting: Emergency Medicine

## 2014-09-30 DIAGNOSIS — N898 Other specified noninflammatory disorders of vagina: Secondary | ICD-10-CM | POA: Diagnosis present

## 2014-09-30 DIAGNOSIS — B373 Candidiasis of vulva and vagina: Secondary | ICD-10-CM | POA: Diagnosis not present

## 2014-09-30 DIAGNOSIS — Z8659 Personal history of other mental and behavioral disorders: Secondary | ICD-10-CM | POA: Diagnosis not present

## 2014-09-30 DIAGNOSIS — D649 Anemia, unspecified: Secondary | ICD-10-CM | POA: Diagnosis not present

## 2014-09-30 DIAGNOSIS — Z79899 Other long term (current) drug therapy: Secondary | ICD-10-CM | POA: Diagnosis not present

## 2014-09-30 DIAGNOSIS — Z3202 Encounter for pregnancy test, result negative: Secondary | ICD-10-CM | POA: Insufficient documentation

## 2014-09-30 DIAGNOSIS — B9689 Other specified bacterial agents as the cause of diseases classified elsewhere: Secondary | ICD-10-CM

## 2014-09-30 DIAGNOSIS — Z720 Tobacco use: Secondary | ICD-10-CM | POA: Diagnosis not present

## 2014-09-30 DIAGNOSIS — I10 Essential (primary) hypertension: Secondary | ICD-10-CM | POA: Diagnosis not present

## 2014-09-30 DIAGNOSIS — Z792 Long term (current) use of antibiotics: Secondary | ICD-10-CM | POA: Insufficient documentation

## 2014-09-30 DIAGNOSIS — B3731 Acute candidiasis of vulva and vagina: Secondary | ICD-10-CM

## 2014-09-30 DIAGNOSIS — N760 Acute vaginitis: Secondary | ICD-10-CM

## 2014-09-30 DIAGNOSIS — Z8751 Personal history of pre-term labor: Secondary | ICD-10-CM | POA: Insufficient documentation

## 2014-09-30 LAB — URINALYSIS, ROUTINE W REFLEX MICROSCOPIC
Bilirubin Urine: NEGATIVE
GLUCOSE, UA: NEGATIVE mg/dL
Hgb urine dipstick: NEGATIVE
Ketones, ur: NEGATIVE mg/dL
Leukocytes, UA: NEGATIVE
Nitrite: NEGATIVE
PROTEIN: NEGATIVE mg/dL
Specific Gravity, Urine: 1.031 — ABNORMAL HIGH (ref 1.005–1.030)
UROBILINOGEN UA: 1 mg/dL (ref 0.0–1.0)
pH: 6 (ref 5.0–8.0)

## 2014-09-30 LAB — PREGNANCY, URINE: PREG TEST UR: NEGATIVE

## 2014-09-30 LAB — WET PREP, GENITAL: TRICH WET PREP: NONE SEEN

## 2014-09-30 MED ORDER — METRONIDAZOLE 500 MG PO TABS
500.0000 mg | ORAL_TABLET | Freq: Once | ORAL | Status: AC
  Administered 2014-09-30: 500 mg via ORAL
  Filled 2014-09-30: qty 1

## 2014-09-30 MED ORDER — FLUCONAZOLE 50 MG PO TABS
150.0000 mg | ORAL_TABLET | Freq: Once | ORAL | Status: AC
  Administered 2014-09-30: 150 mg via ORAL
  Filled 2014-09-30 (×2): qty 1

## 2014-09-30 MED ORDER — FLUCONAZOLE 150 MG PO TABS: 150.0000 mg | ORAL_TABLET | Freq: Once | ORAL | Status: DC

## 2014-09-30 MED ORDER — METRONIDAZOLE 500 MG PO TABS: 500.0000 mg | ORAL_TABLET | Freq: Two times a day (BID) | ORAL | Status: DC

## 2014-09-30 NOTE — ED Notes (Signed)
Discharge instructions and prescriptions given and reviewed with patient.  Patient verbalized understanding to take medications as directed and to follow up with GYN as needed.  Patient ambulatory; discharged home in good condition.

## 2014-09-30 NOTE — ED Provider Notes (Signed)
TIME SEEN: 11:20 AM  CHIEF COMPLAINT: Vaginal discharge and irritation  HPI: Pt is a 29 y.o. female with no significant past history who presents to the emergency department with several days of labial irritation, itching, burning. States she has had mild vaginal discharge. No vaginal bleeding. No fevers, chills, nausea, vomiting or diarrhea. No dysuria or hematuria. She is sexually active with one female partner. 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 She is a G9 P5 A4. Last menstrual cycle was June 1.  ROS: See HPI Constitutional: no fever  Eyes: no drainage  ENT: no runny nose   Cardiovascular:  no chest pain  Resp: no SOB  GI: no vomiting GU: no dysuria Integumentary: no rash  Allergy: no hives  Musculoskeletal: no leg swelling  Neurological: no slurred speech ROS otherwise negative  PAST MEDICAL HISTORY/PAST SURGICAL HISTORY:  Past Medical History  Diagnosis Date  . Hypertension   . Pregnancy induced hypertension   . Anemia   . Headache(784.0)   . Depression   . Abnormal Pap smear 2005  . Sickle cell trait   . Preterm labor     MEDICATIONS:  Prior to Admission medications   Medication Sig Start Date End Date Taking? Authorizing Provider  amLODipine (NORVASC) 10 MG tablet Take 1 tablet (10 mg total) by mouth daily. 08/01/13   Tommie Sams, DO  cephALEXin (KEFLEX) 500 MG capsule Take 1 capsule (500 mg total) by mouth 4 (four) times daily. For 2 days 01/29/14   Toy Cookey, MD  hydrochlorothiazide (HYDRODIURIL) 25 MG tablet Take 1 tablet (25 mg total) by mouth daily. 08/01/13   Tommie Sams, DO  ibuprofen (ADVIL,MOTRIN) 600 MG tablet Take 1 tablet (600 mg total) by mouth every 6 (six) hours. 07/28/13   Levie Heritage, DO  Prenatal Vit-Fe Fumarate-FA (PRENATAL MULTIVITAMIN) TABS tablet Take 1 tablet by mouth daily at 12 noon.    Historical Provider, MD    ALLERGIES:  No Known Allergies  SOCIAL HISTORY:  History  Substance Use Topics  . Smoking status:  Current Every Day Smoker -- 0.50 packs/day    Types: Cigarettes  . Smokeless tobacco: Never Used  . Alcohol Use: No     Comment: social    FAMILY HISTORY: Family History  Problem Relation Age of Onset  . Other Neg Hx   . Hypertension Mother   . Hypertension Father   . Diabetes Maternal Aunt   . Sickle cell trait Daughter     EXAM: BP 139/99 mmHg  Pulse 81  Temp(Src) 98.5 F (36.9 C) (Oral)  Resp 16  Ht  (1.676 m)  Wt 177 lb (80.287 kg)  BMI 28.58 kg/m2  SpO2 98% CONSTITUTIONAL: Alert and oriented and responds appropriately to questions. Well-appearing; well-nourished HEAD: Normocephalic EYES: Conjunctivae clear, PERRL ENT: normal nose; no rhinorrhea; moist mucous membranes; pharynx without lesions noted NECK: Supple, no meningismus, no LAD  CARD: RRR; S1 and S2 appreciated; no murmurs, no clicks, no rubs, no gallops RESP: Normal chest excursion without splinting or tachypnea; breath sounds clear and equal bilaterally; no wheezes, no rhonchi, no rales, no hypoxia or respiratory distress, speaking full sentences ABD/GI: Normal bowel sounds; non-distended; soft, non-tender, no rebound, no guarding, no peritoneal signs GU:  Patient has swollen and irritated labia majora, no abscess, no other lesions, patient has moderate amount of thick white vaginal discharge, no vaginal bleeding, no adnexal tenderness or fullness, no cervical motion tenderness BACK:  The back appears normal and  is non-tender to palpation, there is no CVA tenderness EXT: Normal ROM in all joints; non-tender to palpation; no edema; normal capillary refill; no cyanosis, no calf tenderness or swelling    SKIN: Normal color for age and race; warm NEURO: Moves all extremities equally, sensation to light touch intact diffusely, cranial nerves II through XII intact PSYCH: The patient's mood and manner are appropriate. Grooming and personal hygiene are appropriate.  MEDICAL DECISION MAKING: Patient here with  vaginitis. She does have yeast and clue cells on her wet prep. We'll give Diflucan and discharge with Flagyl. Urine shows no sign of infection and her urine pregnancy test is negative. Have recommended outpatient follow-up with OB/GYN. Discussed return precautions. She verbalized understanding and is comfortable with plan.       Layla Maw Ward, DO 09/30/14 1650

## 2014-09-30 NOTE — Discharge Instructions (Signed)
Bacterial Vaginosis °Bacterial vaginosis is a vaginal infection that occurs when the normal balance of bacteria in the vagina is disrupted. It results from an overgrowth of certain bacteria. This is the most common vaginal infection in women of childbearing age. Treatment is important to prevent complications, especially in pregnant women, as it can cause a premature delivery. °CAUSES  °Bacterial vaginosis is caused by an increase in harmful bacteria that are normally present in smaller amounts in the vagina. Several different kinds of bacteria can cause bacterial vaginosis. However, the reason that the condition develops is not fully understood. °RISK FACTORS °Certain activities or behaviors can put you at an increased risk of developing bacterial vaginosis, including: °· Having a new sex partner or multiple sex partners. °· Douching. °· Using an intrauterine device (IUD) for contraception. °Women do not get bacterial vaginosis from toilet seats, bedding, swimming pools, or contact with objects around them. °SIGNS AND SYMPTOMS  °Some women with bacterial vaginosis have no signs or symptoms. Common symptoms include: °· Grey vaginal discharge. °· A fishlike odor with discharge, especially after sexual intercourse. °· Itching or burning of the vagina and vulva. °· Burning or pain with urination. °DIAGNOSIS  °Your health care provider will take a medical history and examine the vagina for signs of bacterial vaginosis. A sample of vaginal fluid may be taken. Your health care provider will look at this sample under a microscope to check for bacteria and abnormal cells. A vaginal pH test may also be done.  °TREATMENT  °Bacterial vaginosis may be treated with antibiotic medicines. These may be given in the form of a pill or a vaginal cream. A second round of antibiotics may be prescribed if the condition comes back after treatment.  °HOME CARE INSTRUCTIONS  °· Only take over-the-counter or prescription medicines as  directed by your health care provider. °· If antibiotic medicine was prescribed, take it as directed. Make sure you finish it even if you start to feel better. °· Do not have sex until treatment is completed. °· Tell all sexual partners that you have a vaginal infection. They should see their health care provider and be treated if they have problems, such as a mild rash or itching. °· Practice safe sex by using condoms and only having one sex partner. °SEEK MEDICAL CARE IF:  °· Your symptoms are not improving after 3 days of treatment. °· You have increased discharge or pain. °· You have a fever. °MAKE SURE YOU:  °· Understand these instructions. °· Will watch your condition. °· Will get help right away if you are not doing well or get worse. °FOR MORE INFORMATION  °Centers for Disease Control and Prevention, Division of STD Prevention: www.cdc.gov/std °American Sexual Health Association (ASHA): www.ashastd.org  °Document Released: 04/06/2005 Document Revised: 01/25/2013 Document Reviewed: 11/16/2012 °ExitCare® Patient Information ©2015 [employer]. This information is not intended to replace advice given to you by your health care provider. Make sure you discuss any questions you have with your health care provider. °Candidal Vulvovaginitis °Candidal vulvovaginitis is an infection of the vagina and vulva. The vulva is the skin around the opening of the vagina. This may cause itching and discomfort in and around the vagina.  °HOME CARE °· Only take medicine as told by your doctor. °· Do not have sex (intercourse) until the infection is healed or as told by your doctor. °· Practice safe sex. °· Tell your sex partner about your infection. °· Do not douche or use tampons. °· Wear cotton   underwear. Do not wear tight pants or panty hose. °· Eat yogurt. This may help treat and prevent yeast infections. °GET HELP RIGHT AWAY IF:  °· You have a fever. °· Your problems get worse during treatment or do not get better in 3  days. °· You have discomfort, irritation, or itching in your vagina or vulva area. °· You have pain after sex. °· You start to get belly (abdominal) pain. °MAKE SURE YOU: °· Understand these instructions. °· Will watch your condition. °· Will get help right away if you are not doing well or get worse. °Document Released: 07/03/2008 Document Revised: 04/11/2013 Document Reviewed: 07/03/2008 °ExitCare® Patient Information ©2015 [employer]. This information is not intended to replace advice given to you by your health care provider. Make sure you discuss any questions you have with your health care provider. ° °

## 2014-09-30 NOTE — ED Notes (Signed)
Patient c/o vaginal redness and discharge around the labia.  States cannot sleep due to pain.

## 2014-10-01 LAB — GC/CHLAMYDIA PROBE AMP (~~LOC~~) NOT AT ARMC
CHLAMYDIA, DNA PROBE: NEGATIVE
Neisseria Gonorrhea: NEGATIVE

## 2016-08-12 ENCOUNTER — Encounter (HOSPITAL_BASED_OUTPATIENT_CLINIC_OR_DEPARTMENT_OTHER): Payer: Self-pay

## 2016-08-12 ENCOUNTER — Emergency Department (HOSPITAL_BASED_OUTPATIENT_CLINIC_OR_DEPARTMENT_OTHER)
Admission: EM | Admit: 2016-08-12 | Discharge: 2016-08-12 | Disposition: A | Discharge status: Home / Self Care | Payer: Medicaid Other | Attending: Emergency Medicine | Admitting: Emergency Medicine

## 2016-08-12 DIAGNOSIS — Z79899 Other long term (current) drug therapy: Secondary | ICD-10-CM | POA: Insufficient documentation

## 2016-08-12 DIAGNOSIS — F17210 Nicotine dependence, cigarettes, uncomplicated: Secondary | ICD-10-CM | POA: Insufficient documentation

## 2016-08-12 DIAGNOSIS — I10 Essential (primary) hypertension: Secondary | ICD-10-CM | POA: Insufficient documentation

## 2016-08-12 DIAGNOSIS — G43409 Hemiplegic migraine, not intractable, without status migrainosus: Secondary | ICD-10-CM | POA: Insufficient documentation

## 2016-08-12 MED ORDER — DEXAMETHASONE SODIUM PHOSPHATE 10 MG/ML IJ SOLN
10.0000 mg | Freq: Once | INTRAMUSCULAR | Status: AC
  Administered 2016-08-12: 10 mg via INTRAVENOUS
  Filled 2016-08-12: qty 1

## 2016-08-12 MED ORDER — SODIUM CHLORIDE 0.9 % IV BOLUS (SEPSIS)
500.0000 mL | Freq: Once | INTRAVENOUS | Status: AC
  Administered 2016-08-12: 500 mL via INTRAVENOUS

## 2016-08-12 MED ORDER — KETOROLAC TROMETHAMINE 30 MG/ML IJ SOLN
30.0000 mg | Freq: Once | INTRAMUSCULAR | Status: AC
  Administered 2016-08-12: 30 mg via INTRAVENOUS
  Filled 2016-08-12: qty 1

## 2016-08-12 MED ORDER — METOCLOPRAMIDE HCL 5 MG/ML IJ SOLN
10.0000 mg | Freq: Once | INTRAMUSCULAR | Status: AC
  Administered 2016-08-12: 10 mg via INTRAVENOUS
  Filled 2016-08-12: qty 2

## 2016-08-12 NOTE — Discharge Instructions (Signed)

## 2016-08-12 NOTE — ED Provider Notes (Signed)
Emergency Department Provider Note   I have reviewed the triage vital signs and the nursing notes.   HISTORY  Chief Complaint Headache   HPI Alison Coffey is a 31 y.o. female with PMH of depression, HTN, and migraine HA presents to the emergency room in for evaluation of several days of right-sided headache. He states that this feels similar to migraine headaches that she's had in the past she's not had a headache and a very Alison Coffey time. She reports that 3 days ago the headache started behind her right eye. She noticed some eye twitching and severe pain. She did have some mild nausea with severe pain. She's taken Goody's HA powder, Tylenol #3, and Excedrin Migraine with no significant relief. No medication today. No fever, chills, neck discomfort.  Past Medical History:  Diagnosis Date  . Abnormal Pap smear 2005  . Anemia   . Depression   . Headache(784.0)   . Hypertension   . Pregnancy induced hypertension   . Preterm labor   . Sickle cell trait [facility])     Patient Active Problem List   Diagnosis Date Noted  . Preeclampsia in postpartum period 07/31/2013  . Active labor 07/25/2013  . Short cervix affecting pregnancy 06/08/2013  . History of preterm delivery, currently pregnant 12/15/2012  . Supervision of high risk pregnancy in first trimester 12/15/2012  . Hypertension complicating pregnancy, childbirth and puerperium, antepartum 12/15/2012  . 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 DILATION AND CURETTAGE OF UTERUS    . INDUCED ABORTION    . INDUCED ABORTION      Current Outpatient Rx  . Order #: [id number] Class: Normal  . Order #: [id number] Class: Print  . Order #: [id number] Class: Print  . Order #: [id number] Class: Normal  . Order #: [id number] Class: Normal  . Order #: [id number] Class: Print  . Order #: [id number] Class: Historical Med    Allergies Oxycodone  Family History  Problem Relation  Age of Onset  . Hypertension Mother   . Hypertension Father   . Diabetes Maternal Aunt   . Sickle cell trait Daughter   . Other Neg Hx     Social History Social History  Substance Use Topics  . Smoking status: Current Every Day Smoker    Packs/day: 0.50    Types: Cigarettes  . Smokeless tobacco: Never Used  . Alcohol use Yes     Comment: social    Review of Systems  Constitutional: No fever/chills Eyes: No visual changes. ENT: No sore throat. Cardiovascular: Denies chest pain. Respiratory: Denies shortness of breath. Gastrointestinal: No abdominal pain.  No nausea, no vomiting.  No diarrhea.  No constipation. Genitourinary: Negative for dysuria. Musculoskeletal: Negative for back pain. Skin: Negative for rash. Neurological: Negative for focal weakness or numbness. Positive HA.   10-point ROS otherwise negative.  ____________________________________________   PHYSICAL EXAM:  VITAL SIGNS: ED Triage Vitals [08/12/16 1318]  Enc Vitals Group     BP 121/81     Pulse Rate 86     Resp 18     Temp 98.6 F (37 C)     Temp Source Oral     SpO2 98 %     Weight 168 lb (76.2 kg)     Height  (1.676 m)     Pain Score 10   Constitutional: Alert and oriented. Well appearing and in no acute distress. Eyes: Conjunctivae are normal. PERRL. EOMI.  Head: Atraumatic. Nose:  No congestion/rhinnorhea. Mouth/Throat: Mucous membranes are moist.  Neck: No stridor.  No meningeal signs.  Cardiovascular: Normal rate, regular rhythm. Good peripheral circulation. Grossly normal heart sounds.   Respiratory: Normal respiratory effort.  No retractions. Lungs CTAB. Gastrointestinal: Soft and nontender. No distention.  Musculoskeletal: No lower extremity tenderness nor edema. No gross deformities of extremities. Neurologic:  Normal speech and language. No gross focal neurologic deficits are appreciated. Positive photophobia.  Skin:  Skin is warm, dry and intact. No rash  noted.  ____________________________________________   PROCEDURES  Procedure(s) performed:   Procedures  None ____________________________________________   INITIAL IMPRESSION / ASSESSMENT AND PLAN / ED COURSE  Pertinent labs & imaging results that were available during my care of the patient were reviewed by me and considered in my medical decision making (see chart for details).  Patient presents to the ED for evaluation of migraine HA. Typical of prior migraines. Normal fundoscopic exam. No fever, chills, or evidence of infection.   Patient symptoms resolved with migraine medication. No neuro deficits. Plan for discharge with PCP follow up.   At this time, I do not feel there is any life-threatening condition present. I have reviewed and discussed all results (EKG, imaging, lab, urine as appropriate), exam findings with patient. I have reviewed nursing notes and appropriate previous records.  I feel the patient is safe to be discharged home without further emergent workup. Discussed usual and customary return precautions. Patient and family (if present) verbalize understanding and are comfortable with this plan.  Patient will follow-up with their primary care provider. If they do not have a primary care provider, information for follow-up has been provided to them. All questions have been answered.  ____________________________________________  FINAL CLINICAL IMPRESSION(S) / ED DIAGNOSES  Final diagnoses:  Hemiplegic migraine without status migrainosus, not intractable     MEDICATIONS GIVEN DURING THIS VISIT:  Medications  sodium chloride 0.9 % bolus 500 mL (0 mLs Intravenous Stopped 08/12/16 1439)  dexamethasone (DECADRON) injection 10 mg (10 mg Intravenous Given 08/12/16 1405)  ketorolac (TORADOL) 30 MG/ML injection 30 mg (30 mg Intravenous Given 08/12/16 1406)  metoCLOPramide (REGLAN) injection 10 mg (10 mg Intravenous Given 08/12/16 1406)     NEW OUTPATIENT MEDICATIONS  STARTED DURING THIS VISIT:  None   Note:  This document was prepared using Dragon voice recognition software and may include unintentional dictation errors.  Alona Bene, MD Emergency Medicine   Maia Plan, MD 08/12/16 [phone]

## 2016-08-12 NOTE — ED Triage Notes (Addendum)
Pt reports headache that began Sunday - associated nausea, dizziness/eye pressure last night, states Excedrin, Goody Powder and Tylenol #3 were ineffective. Hx of migraines and HTN (but no longer on medications). Drove self to ED.

## 2017-07-05 ENCOUNTER — Other Ambulatory Visit: Payer: Self-pay

## 2017-07-05 ENCOUNTER — Emergency Department (HOSPITAL_BASED_OUTPATIENT_CLINIC_OR_DEPARTMENT_OTHER)
Admission: EM | Admit: 2017-07-05 | Discharge: 2017-07-05 | Disposition: A | Discharge status: Home / Self Care | Payer: Self-pay | Attending: Emergency Medicine | Admitting: Emergency Medicine

## 2017-07-05 ENCOUNTER — Emergency Department (HOSPITAL_BASED_OUTPATIENT_CLINIC_OR_DEPARTMENT_OTHER): Payer: Self-pay

## 2017-07-05 ENCOUNTER — Encounter (HOSPITAL_BASED_OUTPATIENT_CLINIC_OR_DEPARTMENT_OTHER): Payer: Self-pay

## 2017-07-05 DIAGNOSIS — B9789 Other viral agents as the cause of diseases classified elsewhere: Secondary | ICD-10-CM | POA: Insufficient documentation

## 2017-07-05 DIAGNOSIS — J3489 Other specified disorders of nose and nasal sinuses: Secondary | ICD-10-CM | POA: Insufficient documentation

## 2017-07-05 DIAGNOSIS — J069 Acute upper respiratory infection, unspecified: Secondary | ICD-10-CM

## 2017-07-05 DIAGNOSIS — R6883 Chills (without fever): Secondary | ICD-10-CM | POA: Insufficient documentation

## 2017-07-05 DIAGNOSIS — I10 Essential (primary) hypertension: Secondary | ICD-10-CM | POA: Insufficient documentation

## 2017-07-05 DIAGNOSIS — R51 Headache: Secondary | ICD-10-CM | POA: Insufficient documentation

## 2017-07-05 DIAGNOSIS — H9390 Unspecified disorder of ear, unspecified ear: Secondary | ICD-10-CM | POA: Insufficient documentation

## 2017-07-05 DIAGNOSIS — R61 Generalized hyperhidrosis: Secondary | ICD-10-CM | POA: Insufficient documentation

## 2017-07-05 DIAGNOSIS — F17210 Nicotine dependence, cigarettes, uncomplicated: Secondary | ICD-10-CM | POA: Insufficient documentation

## 2017-07-05 MED ORDER — CETIRIZINE-PSEUDOEPHEDRINE ER 5-120 MG PO TB12: 1.0000 | ORAL_TABLET | Freq: Every day | ORAL | 0 refills | Status: DC

## 2017-07-05 MED ORDER — FLUTICASONE PROPIONATE 50 MCG/ACT NA SUSP: 1.0000 | Freq: Every day | NASAL | 2 refills | Status: DC

## 2017-07-05 NOTE — ED Notes (Signed)
NAD at this time. Pt is stable and going home.  

## 2017-07-05 NOTE — ED Triage Notes (Signed)
C/o flu like sx x 4 days-NAD-steady gait 

## 2017-07-05 NOTE — ED Notes (Signed)
Pt tried to [employer]...would not accept signature.

## 2017-07-05 NOTE — ED Provider Notes (Signed)
[facility] EMERGENCY DEPARTMENT Provider Note   CSN: [id number] Arrival date & time: 07/05/17  1054     History   Chief Complaint Chief Complaint  Patient presents with  . Cough    HPI Alison Coffey is a 32 y.o. female.  The history is provided by the patient.  Cough  This is a new problem. Episode onset: 4 days ago. The problem occurs constantly. The problem has been gradually worsening. The cough is productive of brown sputum. There has been no fever. Associated symptoms include chills, sweats, ear congestion, headaches and rhinorrhea. Pertinent negatives include no sore throat, no shortness of breath and no wheezing. She has tried decongestants for the symptoms. The treatment provided no relief. She is a smoker. Her past medical history does not include bronchitis, COPD or asthma.    Past Medical History:  Diagnosis Date  . Abnormal Pap smear 2005  . Anemia   . Depression   . Headache(784.0)   . Hypertension   . Pregnancy induced hypertension   . Preterm labor   . Sickle cell trait [facility])     Patient Active Problem List   Diagnosis Date Noted  . Preeclampsia in postpartum period 07/31/2013  . Active labor 07/25/2013  . Short cervix affecting pregnancy 06/08/2013  . History of preterm delivery, currently pregnant 12/15/2012  . Supervision of high risk pregnancy in first trimester 12/15/2012  . Hypertension complicating pregnancy, childbirth and puerperium, antepartum 12/15/2012  . 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 DILATION AND CURETTAGE OF UTERUS    . INDUCED ABORTION    . INDUCED ABORTION      OB History    Gravida Para Term Preterm AB Living   10 5 4 1 5 5    SAB TAB Ectopic Multiple Live Births     5     5       Home Medications    Prior to Admission medications   Not on File    Family History Family History  Problem Relation Age of Onset  . Hypertension  Mother   . Hypertension Father   . Diabetes Maternal Aunt   . Sickle cell trait Daughter   . Other Neg Hx     Social History Social History   Tobacco Use  . Smoking status: Current Every Day Smoker    Packs/day: 0.50    Types: Cigarettes  . Smokeless tobacco: Never Used  Substance Use Topics  . Alcohol use: Yes    Comment: occ  . Drug use: No     Allergies   Oxycodone   Review of Systems Review of Systems  Constitutional: Positive for chills.  HENT: Positive for rhinorrhea. Negative for sore throat.   Respiratory: Positive for cough. Negative for shortness of breath and wheezing.   Neurological: Positive for headaches.  All other systems reviewed and are negative.    Physical Exam Updated Vital Signs BP (!) 137/96 (BP Location: Right Arm)   Pulse 92   Temp 98.3 F (36.8 C) (Oral)   Resp 18   LMP 07/03/2017   SpO2 100%   Physical Exam  Constitutional: She is oriented to person, place, and time. She appears well-developed and well-nourished. No distress.  HENT:  Head: Normocephalic and atraumatic.  Right Ear: Tympanic membrane normal.  Left Ear: Tympanic membrane normal.  Nose: Mucosal edema and rhinorrhea present.  Mouth/Throat: Oropharynx is clear and moist.  Eyes:  Conjunctivae and EOM are normal. Pupils are equal, round, and reactive to light.  Neck: Normal range of motion. Neck supple.  Cardiovascular: Normal rate, regular rhythm and intact distal pulses.  No murmur heard. Pulmonary/Chest: Effort normal and breath sounds normal. No respiratory distress. She has no wheezes. She has no rales.  Abdominal: Soft. She exhibits no distension. There is no tenderness. There is no rebound and no guarding.  Musculoskeletal: Normal range of motion. She exhibits no edema or tenderness.  Neurological: She is alert and oriented to person, place, and time.  Skin: Skin is warm and dry. No rash noted. No erythema.  Psychiatric: She has a normal mood and affect. Her  behavior is normal.  Nursing note and vitals reviewed.    ED Treatments / Results  Labs (all labs ordered are listed, but only abnormal results are displayed) Labs Reviewed - No data to display  EKG  EKG Interpretation None       Radiology Dg Chest 2 View  Result Date: 07/05/2017 CLINICAL DATA:  Cough and congestion for 2 weeks. EXAM: CHEST - 2 VIEW COMPARISON:  None. FINDINGS: The lungs are clear. There is some peribronchial thickening. No pneumothorax or pleural effusion. Heart size is normal. No bony abnormality. IMPRESSION: Bronchitic change.  No focal abnormality. Electronically Signed   By: Drusilla Kannerhomas  Dalessio M.D.   On: 07/05/2017 11:50    Procedures Procedures (including critical care time)  Medications Ordered in ED Medications - No data to display   Initial Impression / Assessment and Plan / ED Course  I have reviewed the triage vital signs and the nursing notes.  Pertinent labs & imaging results that were available during my care of the patient were reviewed by me and considered in my medical decision making (see chart for details).     Pt with symptoms consistent with viral URI vs allergies.  Well appearing here.  No signs of breathing difficulty  No signs of pharyngitis, otitis or abnormal abdominal findings.   CXR showed bronchitic changes but no other acute issues and pt to return with any further problems. Patient also gets seasonal allergies so will start on Zyrtec and Flonase as this may be a component of her symptoms.  Final Clinical Impressions(s) / ED Diagnoses   Final diagnoses:  Viral URI with cough    ED Discharge Orders        Ordered    cetirizine-pseudoephedrine (ZYRTEC-D) 5-120 MG tablet  Daily     07/05/17 1212    fluticasone (FLONASE) 50 MCG/ACT nasal spray  Daily     07/05/17 1212       Gwyneth SproutPlunkett, Chisom Aust, MD 07/05/17 1214

## 2017-08-05 ENCOUNTER — Encounter (HOSPITAL_BASED_OUTPATIENT_CLINIC_OR_DEPARTMENT_OTHER): Payer: Self-pay | Admitting: *Deleted

## 2017-08-05 ENCOUNTER — Other Ambulatory Visit: Payer: Self-pay

## 2017-08-05 ENCOUNTER — Encounter: Payer: Self-pay | Admitting: Neurology

## 2017-08-05 ENCOUNTER — Emergency Department (HOSPITAL_BASED_OUTPATIENT_CLINIC_OR_DEPARTMENT_OTHER)
Admission: EM | Admit: 2017-08-05 | Discharge: 2017-08-05 | Disposition: A | Discharge status: Home / Self Care | Payer: Medicaid Other | Attending: Emergency Medicine | Admitting: Emergency Medicine

## 2017-08-05 DIAGNOSIS — Z79899 Other long term (current) drug therapy: Secondary | ICD-10-CM | POA: Insufficient documentation

## 2017-08-05 DIAGNOSIS — R519 Headache, unspecified: Secondary | ICD-10-CM

## 2017-08-05 DIAGNOSIS — F17210 Nicotine dependence, cigarettes, uncomplicated: Secondary | ICD-10-CM | POA: Insufficient documentation

## 2017-08-05 DIAGNOSIS — R110 Nausea: Secondary | ICD-10-CM | POA: Insufficient documentation

## 2017-08-05 DIAGNOSIS — I10 Essential (primary) hypertension: Secondary | ICD-10-CM | POA: Insufficient documentation

## 2017-08-05 DIAGNOSIS — R51 Headache: Secondary | ICD-10-CM | POA: Insufficient documentation

## 2017-08-05 MED ORDER — ACETAMINOPHEN 500 MG PO TABS
1000.0000 mg | ORAL_TABLET | Freq: Once | ORAL | Status: AC
Start: 2017-08-05 — End: 2017-08-05
  Administered 2017-08-05: 1000 mg via ORAL
  Filled 2017-08-05: qty 2

## 2017-08-05 NOTE — ED Triage Notes (Signed)
Headache all week. Hx of HTN since age 32. She had tingling in her face and pain behind her right eye while at work this am. States this is always her first indication that her BP is elevated. No numbness on arrival to the ED.

## 2017-08-05 NOTE — ED Notes (Signed)
ED Provider at bedside. 

## 2017-08-05 NOTE — ED Provider Notes (Signed)
[facility] EMERGENCY DEPARTMENT Provider Note   CSN: [id number] Arrival date & time: 08/05/17  1152     History   Chief Complaint No chief complaint on file.   HPI Alison Coffey is a 32 y.o. female.  32 yo F with a chief complaint of a headache.  Going on for the past 4-5 days.  She feels like it is getting better today.  Was concerned because her blood pressure been high at home.  Decided to come to the ED for evaluation.  She feels that her headache is actually getting better.  Right-sided history of migraines and feels this feels the same but worse.  States typically she gets headaches like this when her blood pressure is high.  Denies vomiting is had some mild nausea.  Denies fevers or chills.  Denies neck stiffness.  Denies trauma.  The history is provided by the patient.  Illness  This is a new problem. The current episode started 2 days ago. The problem occurs constantly. The problem has not changed since onset.Associated symptoms include headaches. Pertinent negatives include no chest pain and no shortness of breath. Nothing aggravates the symptoms. Nothing relieves the symptoms. She has tried nothing for the symptoms. The treatment provided no relief.    Past Medical History:  Diagnosis Date  . Abnormal Pap smear 2005  . Anemia   . Depression   . Headache(784.0)   . Hypertension   . Pregnancy induced hypertension   . Preterm labor   . Sickle cell trait [facility])     Patient Active Problem List   Diagnosis Date Noted  . Preeclampsia in postpartum period 07/31/2013  . Active labor 07/25/2013  . Short cervix affecting pregnancy 06/08/2013  . History of preterm delivery, currently pregnant 12/15/2012  . Supervision of high risk pregnancy in first trimester 12/15/2012  . Hypertension complicating pregnancy, childbirth and puerperium, antepartum 12/15/2012  . 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 DILATION AND CURETTAGE OF UTERUS    . INDUCED ABORTION    . INDUCED ABORTION       OB History    Gravida  10   Para  5   Term  4   Preterm  1   AB  5   Living  5     SAB      TAB  5   Ectopic      Multiple      Live Births  5            Home Medications    Prior to Admission medications   Medication Sig Start Date End Date Taking? Authorizing Provider  cetirizine-pseudoephedrine (ZYRTEC-D) 5-120 MG tablet Take 1 tablet by mouth daily. 07/05/17  Yes Plunkett, Alphonzo Lemmings, MD  fluticasone (FLONASE) 50 MCG/ACT nasal spray Place 1 spray into both nostrils daily. 07/05/17  Yes Gwyneth Sprout, MD    Family History Family History  Problem Relation Age of Onset  . Hypertension Mother   . Hypertension Father   . Diabetes Maternal Aunt   . Sickle cell trait Daughter   . Other Neg Hx     Social History Social History   Tobacco Use  . Smoking status: Current Every Day Smoker    Packs/day: 0.50    Types: Cigarettes  . Smokeless tobacco: Never Used  Substance Use Topics  . Alcohol use: Yes    Comment: occ  . Drug use: No  Allergies   Oxycodone   Review of Systems Review of Systems  Constitutional: Negative for chills and fever.  HENT: Negative for congestion and rhinorrhea.   Eyes: Negative for redness and visual disturbance.  Respiratory: Negative for shortness of breath and wheezing.   Cardiovascular: Negative for chest pain and palpitations.  Gastrointestinal: Negative for nausea and vomiting.  Genitourinary: Negative for dysuria and urgency.  Musculoskeletal: Negative for arthralgias and myalgias.  Skin: Negative for pallor and wound.  Neurological: Positive for headaches. Negative for dizziness.     Physical Exam Updated Vital Signs BP 118/81   Pulse 67   Temp 98.3 F (36.8 C) (Oral)   Resp 18   Ht 5' 6.5" (1.689 m)   Wt 81.6 kg (180 lb)   LMP 07/29/2017   SpO2 100%   BMI 28.62 kg/m   Physical Exam    Constitutional: She is oriented to person, place, and time. She appears well-developed and well-nourished. No distress.  HENT:  Head: Normocephalic and atraumatic.  Eyes: Pupils are equal, round, and reactive to light. EOM are normal.  Neck: Normal range of motion. Neck supple.  Cardiovascular: Normal rate and regular rhythm. Exam reveals no gallop and no friction rub.  No murmur heard. Pulmonary/Chest: Effort normal. She has no wheezes. She has no rales.  Abdominal: Soft. She exhibits no distension. There is no tenderness.  Musculoskeletal: She exhibits no edema or tenderness.  Neurological: She is alert and oriented to person, place, and time. She has normal strength. No cranial nerve deficit or sensory deficit. Coordination and gait normal. GCS eye subscore is 4. GCS verbal subscore is 5. GCS motor subscore is 6. She displays no Babinski's sign on the right side. She displays no Babinski's sign on the left side.  Skin: Skin is warm and dry. She is not diaphoretic.  Psychiatric: She has a normal mood and affect. Her behavior is normal.  Nursing note and vitals reviewed.    ED Treatments / Results  Labs (all labs ordered are listed, but only abnormal results are displayed) Labs Reviewed - No data to display  EKG None  Radiology No results found.  Procedures Procedures (including critical care time)  Medications Ordered in ED Medications  acetaminophen (TYLENOL) tablet 1,000 mg (has no administration in time range)     Initial Impression / Assessment and Plan / ED Course  I have reviewed the triage vital signs and the nursing notes.  Pertinent labs & imaging results that were available during my care of the patient were reviewed by me and considered in my medical decision making (see chart for details).     32 yo F with a chief complaint of a headache.  Going on for for 5 days.  Actually is significantly improved by the time she arrived to the ED.  I offered a headache  cocktail which she declined.  She is concerned about her blood pressure being high however she is normotensive here.  Will have her follow-up with her PCP.  Give neurology follow-up for recurrent headaches requiring visits to the ED.  1:29 PM:  I have discussed the diagnosis/risks/treatment options with the patient and family and believe the pt to be eligible for discharge home to follow-up with PCP. We also discussed returning to the ED immediately if new or worsening sx occur. We discussed the sx which are most concerning (e.g., sudden worsening pain, fever, inability to tolerate by mouth) that necessitate immediate return. Medications administered to the patient during their visit  and any new prescriptions provided to the patient are listed below.  Medications given during this visit Medications  acetaminophen (TYLENOL) tablet 1,000 mg (has no administration in time range)      The patient appears reasonably screen and/or stabilized for discharge and I doubt any other medical condition or other [facility] requiring further screening, evaluation, or treatment in the ED at this time prior to discharge.    Final Clinical Impressions(s) / ED Diagnoses   Final diagnoses:  Bad headache    ED Discharge Orders        Ordered    Ambulatory referral to Neurology    Comments:  Headache syndrome   08/05/17 1324       Melene PlanFloyd, Ingrid Shifrin, DO 08/05/17 1329

## 2017-08-16 ENCOUNTER — Encounter: Payer: Self-pay | Admitting: Neurology

## 2017-09-05 ENCOUNTER — Encounter (HOSPITAL_BASED_OUTPATIENT_CLINIC_OR_DEPARTMENT_OTHER): Payer: Self-pay | Admitting: Emergency Medicine

## 2017-09-05 ENCOUNTER — Emergency Department (HOSPITAL_BASED_OUTPATIENT_CLINIC_OR_DEPARTMENT_OTHER)
Admission: EM | Admit: 2017-09-05 | Discharge: 2017-09-05 | Disposition: A | Discharge status: Home / Self Care | Payer: Medicaid Other | Attending: Emergency Medicine | Admitting: Emergency Medicine

## 2017-09-05 ENCOUNTER — Other Ambulatory Visit: Payer: Self-pay

## 2017-09-05 DIAGNOSIS — Z79899 Other long term (current) drug therapy: Secondary | ICD-10-CM | POA: Insufficient documentation

## 2017-09-05 DIAGNOSIS — I10 Essential (primary) hypertension: Secondary | ICD-10-CM | POA: Insufficient documentation

## 2017-09-05 DIAGNOSIS — F17210 Nicotine dependence, cigarettes, uncomplicated: Secondary | ICD-10-CM | POA: Insufficient documentation

## 2017-09-05 DIAGNOSIS — N898 Other specified noninflammatory disorders of vagina: Secondary | ICD-10-CM | POA: Insufficient documentation

## 2017-09-05 LAB — WET PREP, GENITAL
Sperm: NONE SEEN
TRICH WET PREP: NONE SEEN
YEAST WET PREP: NONE SEEN

## 2017-09-05 LAB — URINALYSIS, ROUTINE W REFLEX MICROSCOPIC
Bilirubin Urine: NEGATIVE
Glucose, UA: NEGATIVE mg/dL
Hgb urine dipstick: NEGATIVE
Ketones, ur: NEGATIVE mg/dL
LEUKOCYTES UA: NEGATIVE
NITRITE: NEGATIVE
PROTEIN: NEGATIVE mg/dL
Specific Gravity, Urine: 1.02 (ref 1.005–1.030)
pH: 7 (ref 5.0–8.0)

## 2017-09-05 LAB — PREGNANCY, URINE: Preg Test, Ur: NEGATIVE

## 2017-09-05 MED ORDER — VALACYCLOVIR HCL 1 G PO TABS: 1000.0000 mg | ORAL_TABLET | Freq: Two times a day (BID) | ORAL | 0 refills | Status: AC

## 2017-09-05 NOTE — Discharge Instructions (Addendum)
Take the medications as prescribed, follow up with an OB GYN doctor in the next week or so to be rechecked

## 2017-09-05 NOTE — ED Provider Notes (Signed)
[facility] EMERGENCY DEPARTMENT Provider Note   CSN: [id number] Arrival date & time: 09/05/17  0818     History   Chief Complaint Chief Complaint  Patient presents with  . Vaginal Itching    HPI Alison Coffey is a 32 y.o. female.  HPI Patient presents to the emergency room for evaluation of vaginal irritation.  Patient states a few days ago she had an ingrown hair in the vaginal area.  She is not sure if that is related.  This morning when she urinated she had some discomfort.  When she looked down in the vaginal area she noticed a bump/sore more in the inside of the vaginal area.  She denies any vaginal discharge.  No dysuria.  Patient has had unprotected intercourse recently but has had the same partner for the last year. Past Medical History:  Diagnosis Date  . Abnormal Pap smear 2005  . Anemia   . Depression   . Headache(784.0)   . Hypertension   . Pregnancy induced hypertension   . Preterm labor   . Sickle cell trait [facility])     Patient Active Problem List   Diagnosis Date Noted  . Preeclampsia in postpartum period 07/31/2013  . Active labor 07/25/2013  . Short cervix affecting pregnancy 06/08/2013  . History of preterm delivery, currently pregnant 12/15/2012  . Supervision of high risk pregnancy in first trimester 12/15/2012  . Hypertension complicating pregnancy, childbirth and puerperium, antepartum 12/15/2012  . 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 DILATION AND CURETTAGE OF UTERUS    . INDUCED ABORTION    . INDUCED ABORTION       OB History    Gravida  10   Para  5   Term  4   Preterm  1   AB  5   Living  5     SAB      TAB  5   Ectopic      Multiple      Live Births  5            Home Medications    Prior to Admission medications   Medication Sig Start Date End Date Taking? Authorizing Provider  cetirizine-pseudoephedrine (ZYRTEC-D) 5-120  MG tablet Take 1 tablet by mouth daily. 07/05/17   Gwyneth Sprout, MD  fluticasone (FLONASE) 50 MCG/ACT nasal spray Place 1 spray into both nostrils daily. 07/05/17   Gwyneth Sprout, MD  valACYclovir (VALTREX) 1000 MG tablet Take 1 tablet (1,000 mg total) by mouth 2 (two) times daily for 7 days. 09/05/17 09/12/17  Linwood Dibbles, MD    Family History Family History  Problem Relation Age of Onset  . Hypertension Mother   . Hypertension Father   . Diabetes Maternal Aunt   . Sickle cell trait Daughter   . Other Neg Hx     Social History Social History   Tobacco Use  . Smoking status: Current Every Day Smoker    Packs/day: 0.50    Types: Cigarettes  . Smokeless tobacco: Never Used  Substance Use Topics  . Alcohol use: Yes    Comment: occ  . Drug use: No     Allergies   Oxycodone   Review of Systems Review of Systems  All other systems reviewed and are negative.    Physical Exam Updated Vital Signs BP 125/83 (BP Location: Left Arm)   Pulse 72   Temp 98.3 F (36.8  C) (Oral)   Resp 18   Ht 1.676 m ( )   Wt 81.6 kg (180 lb)   LMP 08/27/2017   SpO2 100%   BMI 29.05 kg/m   Physical Exam  Constitutional: She appears well-developed and well-nourished. No distress.  HENT:  Head: Normocephalic and atraumatic.  Right Ear: External ear normal.  Left Ear: External ear normal.  Eyes: Conjunctivae are normal. Right eye exhibits no discharge. Left eye exhibits no discharge. No scleral icterus.  Neck: Neck supple. No tracheal deviation present.  Cardiovascular: Normal rate.  Pulmonary/Chest: Effort normal. No stridor. No respiratory distress.  Abdominal: Soft. She exhibits no distension. There is no tenderness. There is no guarding.  Genitourinary: Vagina normal and uterus normal. No labial fusion. There is rash on the right labia. There is no lesion on the right labia. There is rash on the left labia. There is no lesion on the left labia. Cervix exhibits no motion  tenderness and no discharge. Right adnexum displays no mass and no tenderness. Left adnexum displays no mass and no tenderness. No tenderness in the vagina. No signs of injury around the vagina.  Genitourinary Comments: Several small 1 mm raised areas left labia minora, no clear ulceration, mild breakdown in the skin  Musculoskeletal: She exhibits no edema.  Neurological: She is alert. Cranial nerve deficit: no gross deficits.  Skin: Skin is warm and dry. No rash noted.  Psychiatric: She has a normal mood and affect.  Nursing note and vitals reviewed.    ED Treatments / Results  Labs (all labs ordered are listed, but only abnormal results are displayed) Labs Reviewed  WET PREP, GENITAL - Abnormal; Notable for the following components:      Result Value   Clue Cells Wet Prep HPF POC PRESENT (*)    WBC, Wet Prep HPF POC MANY (*)    All other components within normal limits  RPR  HIV ANTIBODY (ROUTINE TESTING)  URINALYSIS, ROUTINE W REFLEX MICROSCOPIC  PREGNANCY, URINE  GC/CHLAMYDIA PROBE AMP () NOT AT [facility]    EKG None  Radiology No results found.  Procedures Procedures (including critical care time)  Medications Ordered in ED Medications - No data to display   Initial Impression / Assessment and Plan / ED Course  I have reviewed the triage vital signs and the nursing notes.  Pertinent labs & imaging results that were available during my care of the patient were reviewed by me and considered in my medical decision making (see chart for details).   Questionable hsv infection. Could also be irritation to the vaginal tissue. Will treat empirically for hsv. Cultures sent Final Clinical Impressions(s) / ED Diagnoses   Final diagnoses:  Vaginal lesion    ED Discharge Orders        Ordered    valACYclovir (VALTREX) 1000 MG tablet  2 times daily     09/05/17 1030       Linwood Dibbles, MD 09/05/17 1032

## 2017-09-05 NOTE — ED Triage Notes (Signed)
Patient states that she has had a bumps and irritation to her vaginal area starting this am

## 2017-09-06 LAB — GC/CHLAMYDIA PROBE AMP (~~LOC~~) NOT AT ARMC
CHLAMYDIA, DNA PROBE: NEGATIVE
NEISSERIA GONORRHEA: NEGATIVE

## 2017-09-06 LAB — RPR: RPR Ser Ql: NONREACTIVE

## 2017-09-06 LAB — HIV ANTIBODY (ROUTINE TESTING W REFLEX): HIV SCREEN 4TH GENERATION: NONREACTIVE

## 2017-09-07 LAB — HSV CULTURE AND TYPING

## 2017-09-07 NOTE — ED Notes (Signed)
09/07/2017, 14:00  Pt. Called and requested results of her HSV test.  Called pt. To inform her they are still in process. "  Informed pt. That she will receive a call if they are Positive and she can call to check on her results any time.

## 2017-10-27 ENCOUNTER — Encounter (HOSPITAL_COMMUNITY): Payer: Self-pay

## 2017-10-27 ENCOUNTER — Ambulatory Visit (HOSPITAL_COMMUNITY)
Admission: EM | Admit: 2017-10-27 | Discharge: 2017-10-27 | Disposition: A | Discharge status: Home / Self Care | Payer: Medicaid Other | Attending: Family Medicine | Admitting: Family Medicine

## 2017-10-27 DIAGNOSIS — J3489 Other specified disorders of nose and nasal sinuses: Secondary | ICD-10-CM

## 2017-10-27 DIAGNOSIS — R519 Headache, unspecified: Secondary | ICD-10-CM

## 2017-10-27 DIAGNOSIS — J302 Other seasonal allergic rhinitis: Secondary | ICD-10-CM

## 2017-10-27 DIAGNOSIS — R51 Headache: Secondary | ICD-10-CM

## 2017-10-27 DIAGNOSIS — H9201 Otalgia, right ear: Secondary | ICD-10-CM

## 2017-10-27 DIAGNOSIS — Z3202 Encounter for pregnancy test, result negative: Secondary | ICD-10-CM

## 2017-10-27 LAB — POCT PREGNANCY, URINE: Preg Test, Ur: NEGATIVE

## 2017-10-27 MED ORDER — FLUTICASONE PROPIONATE 50 MCG/ACT NA SUSP: 1.0000 | Freq: Every day | NASAL | 0 refills | Status: DC

## 2017-10-27 MED ORDER — CETIRIZINE HCL 10 MG PO CAPS: 1.0000 | ORAL_CAPSULE | Freq: Every day | ORAL | 0 refills | Status: DC

## 2017-10-27 NOTE — ED Triage Notes (Signed)
Pt presents with upper respiratory issues, headaches and earaches. Pt believes she may be pregnant.

## 2017-10-27 NOTE — Discharge Instructions (Addendum)
Urine pregnancy negative Follow up with PCP as needed  Get plenty of rest and push fluids Zyrtec prescribed take as directed Flonase prescribed Use OTC medication as needed for symptomatic relief Follow up with PCP if symptoms persist Return or go to ER if you have any new or worsening symptoms

## 2017-10-27 NOTE — ED Provider Notes (Signed)
[facility]   [id number] 10/27/17 Arrival Time: 1101  SUBJECTIVE: History from: patient.  Drema Eddington is a 32 y.o. female who presents with abrupt onset right ear pain that began this morning.  Denies positive sick exposure or precipitating event.  Has tried tylenol with temporary relief.  Symptoms are made worse with talking on the phone. Denies previous symptoms in the past.  Complains of associated sinus headache.  Denies fever, chills, fatigue, rhinorrhea, sore throat, SOB, wheezing, chest pain, nausea, changes in bowel or bladder habits.    Concerned for possible pregnancy.  Last unprotected sex 10/11/17.  LMP 10/18/17.    ROS: As per HPI.  Past Medical History:  Diagnosis Date  . Abnormal Pap smear 2005  . Anemia   . Depression   . Headache(784.0)   . Hypertension   . Pregnancy induced hypertension   . Preterm labor   . Sickle cell trait [facility])    Past Surgical History:  Procedure Laterality Date  . DILATION AND CURETTAGE OF UTERUS    . INDUCED ABORTION    . INDUCED ABORTION     Allergies  Allergen Reactions  . Oxycodone     Percocet causes itching   No current facility-administered medications on file prior to encounter.    Current Outpatient Medications on File Prior to Encounter  Medication Sig Dispense Refill  . cetirizine-pseudoephedrine (ZYRTEC-D) 5-120 MG tablet Take 1 tablet by mouth daily. 30 tablet 0   Social History   Socioeconomic History  . Marital status: Single    Spouse name: Not on file  . Number of children: Not on file  . Years of education: Not on file  . Highest education level: Not on file  Occupational History  . Not on file  Social Needs  . Financial resource strain: Not on file  . Food insecurity:    Worry: Not on file    Inability: Not on file  . Transportation needs:    Medical: Not on file    Non-medical: Not on file  Tobacco Use  . Smoking status: Current Every Day Smoker    Packs/day: 0.50    Types:  Cigarettes  . Smokeless tobacco: Never Used  Substance and Sexual Activity  . Alcohol use: Yes    Comment: occ  . Drug use: No  . Sexual activity: Not on file  Lifestyle  . Physical activity:    Days per week: Not on file    Minutes per session: Not on file  . Stress: Not on file  Relationships  . Social connections:    Talks on phone: Not on file    Gets together: Not on file    Attends religious service: Not on file    Active member of club or organization: Not on file    Attends meetings of clubs or organizations: Not on file    Relationship status: Not on file  . Intimate partner violence:    Fear of current or ex partner: Not on file    Emotionally abused: Not on file    Physically abused: Not on file    Forced sexual activity: Not on file  Other Topics Concern  . Not on file  Social History Narrative  . Not on file   Family History  Problem Relation Age of Onset  . Hypertension Mother   . Hypertension Father   . Diabetes Maternal Aunt   . Sickle cell trait Daughter   . Other Neg Hx  OBJECTIVE:  Vitals:   10/27/17 1146  BP: (!) 141/97  Pulse: 89  Resp: 20  Temp: 98.2 F (36.8 C)  TempSrc: Oral  SpO2: 100%     General appearance: alert and active; nontoxic appearance HEENT: Ears: EACs clear, TMs pearly gray with visible cone of light, without erythema; Eyes: PERRL, EOMI grossly; left maxillary sinus tenderness to palpation; Nose: no rhinorrhea, boggy turbinates; Throat: oropharynx clear, tonsils non-erythematous, uvula midline Neck: supple without LAD Lungs: CTA bilaterally  Heart: regular rate and rhythm.  Radial pulses 2+ symmetrical bilaterally Skin: warm and dry Psychological: alert and cooperative; normal mood and affect  ASSESSMENT & PLAN:  1. Sinus headache   2. Right ear pain   3. Seasonal allergies   4. Urine pregnancy test negative     Meds ordered this encounter  Medications  . Cetirizine HCl (ZYRTEC ALLERGY) 10 MG CAPS    Sig:  Take 1 capsule (10 mg total) by mouth daily.    Dispense:  20 capsule    Refill:  0    Order Specific Question:   Supervising Provider    Answer:   Isa RankinMURRAY, LAURA WILSON [phone][[id number]]  . fluticasone (FLONASE) 50 MCG/ACT nasal spray    Sig: Place 1 spray into both nostrils daily.    Dispense:  16 g    Refill:  0    Order Specific Question:   Supervising Provider    Answer:   Isa RankinMURRAY, LAURA WILSON [[id number]]   Urine pregnancy negative Follow up with PCP as needed  Get plenty of rest and push fluids Zyrtec prescribed take as directed Flonase prescribed Use OTC medication as needed for symptomatic relief Follow up with PCP if symptoms persist Return or go to ER if you have any new or worsening symptoms   Reviewed expectations re: course of current medical issues. Questions answered. Outlined signs and symptoms indicating need for more acute intervention. Patient verbalized understanding. After Visit Summary given.         Rennis HardingWurst, Keylen Uzelac, PA-C 10/27/17 1214

## 2017-11-18 ENCOUNTER — Other Ambulatory Visit: Payer: Self-pay

## 2017-11-18 ENCOUNTER — Ambulatory Visit (HOSPITAL_COMMUNITY)
Admission: EM | Admit: 2017-11-18 | Discharge: 2017-11-18 | Disposition: A | Discharge status: Home / Self Care | Payer: Medicaid Other | Attending: Family Medicine | Admitting: Family Medicine

## 2017-11-18 ENCOUNTER — Encounter (HOSPITAL_COMMUNITY): Payer: Self-pay | Admitting: Emergency Medicine

## 2017-11-18 DIAGNOSIS — Z79899 Other long term (current) drug therapy: Secondary | ICD-10-CM | POA: Insufficient documentation

## 2017-11-18 DIAGNOSIS — D573 Sickle-cell trait: Secondary | ICD-10-CM | POA: Insufficient documentation

## 2017-11-18 DIAGNOSIS — I10 Essential (primary) hypertension: Secondary | ICD-10-CM | POA: Insufficient documentation

## 2017-11-18 DIAGNOSIS — R109 Unspecified abdominal pain: Secondary | ICD-10-CM

## 2017-11-18 DIAGNOSIS — Z9889 Other specified postprocedural states: Secondary | ICD-10-CM | POA: Insufficient documentation

## 2017-11-18 DIAGNOSIS — Z791 Long term (current) use of non-steroidal anti-inflammatories (NSAID): Secondary | ICD-10-CM | POA: Insufficient documentation

## 2017-11-18 DIAGNOSIS — Z8481 Family history of carrier of genetic disease: Secondary | ICD-10-CM | POA: Insufficient documentation

## 2017-11-18 DIAGNOSIS — Z3202 Encounter for pregnancy test, result negative: Secondary | ICD-10-CM

## 2017-11-18 DIAGNOSIS — F17210 Nicotine dependence, cigarettes, uncomplicated: Secondary | ICD-10-CM | POA: Insufficient documentation

## 2017-11-18 DIAGNOSIS — S39012A Strain of muscle, fascia and tendon of lower back, initial encounter: Secondary | ICD-10-CM | POA: Insufficient documentation

## 2017-11-18 DIAGNOSIS — Z885 Allergy status to narcotic agent status: Secondary | ICD-10-CM | POA: Insufficient documentation

## 2017-11-18 DIAGNOSIS — X58XXXA Exposure to other specified factors, initial encounter: Secondary | ICD-10-CM | POA: Insufficient documentation

## 2017-11-18 DIAGNOSIS — Z8249 Family history of ischemic heart disease and other diseases of the circulatory system: Secondary | ICD-10-CM | POA: Insufficient documentation

## 2017-11-18 LAB — COMPREHENSIVE METABOLIC PANEL
ALT: 14 U/L (ref 0–44)
AST: 18 U/L (ref 15–41)
Albumin: 4.3 g/dL (ref 3.5–5.0)
Alkaline Phosphatase: 67 U/L (ref 38–126)
Anion gap: 10 (ref 5–15)
BILIRUBIN TOTAL: 0.8 mg/dL (ref 0.3–1.2)
BUN: 11 mg/dL (ref 6–20)
CALCIUM: 9.1 mg/dL (ref 8.9–10.3)
CO2: 25 mmol/L (ref 22–32)
CREATININE: 0.9 mg/dL (ref 0.44–1.00)
Chloride: 102 mmol/L (ref 98–111)
Glucose, Bld: 93 mg/dL (ref 70–99)
Potassium: 4.1 mmol/L (ref 3.5–5.1)
Sodium: 137 mmol/L (ref 135–145)
TOTAL PROTEIN: 7.3 g/dL (ref 6.5–8.1)

## 2017-11-18 LAB — CBC
HEMATOCRIT: 41.8 % (ref 36.0–46.0)
Hemoglobin: 14.8 g/dL (ref 12.0–15.0)
MCH: 27.9 pg (ref 26.0–34.0)
MCHC: 35.4 g/dL (ref 30.0–36.0)
MCV: 78.7 fL (ref 78.0–100.0)
Platelets: 191 10*3/uL (ref 150–400)
RBC: 5.31 MIL/uL — ABNORMAL HIGH (ref 3.87–5.11)
RDW: 12.7 % (ref 11.5–15.5)
WBC: 7.1 10*3/uL (ref 4.0–10.5)

## 2017-11-18 LAB — HCG, QUANTITATIVE, PREGNANCY

## 2017-11-18 LAB — POCT URINALYSIS DIP (DEVICE)
GLUCOSE, UA: NEGATIVE mg/dL
Ketones, ur: NEGATIVE mg/dL
Leukocytes, UA: NEGATIVE
Nitrite: NEGATIVE
PROTEIN: NEGATIVE mg/dL
SPECIFIC GRAVITY, URINE: 1.02 (ref 1.005–1.030)
pH: 6.5 (ref 5.0–8.0)

## 2017-11-18 LAB — POCT PREGNANCY, URINE: PREG TEST UR: NEGATIVE

## 2017-11-18 MED ORDER — NAPROXEN 500 MG PO TABS: 500.0000 mg | ORAL_TABLET | Freq: Two times a day (BID) | ORAL | 0 refills | Status: DC

## 2017-11-18 MED ORDER — CYCLOBENZAPRINE HCL 10 MG PO TABS: 10.0000 mg | ORAL_TABLET | Freq: Two times a day (BID) | ORAL | 0 refills | Status: DC | PRN

## 2017-11-18 NOTE — Discharge Instructions (Signed)
Use anti-inflammatories for pain/swelling. You may take Naprosyn twice daily with food. You may supplement with Tylenol [phone] mg every 8 hours.   You may use flexeril as needed to help with pain. This is a muscle relaxer and causes sedation- please use only at bedtime or when you will be home and not have to drive/work-start with taking 1/2 tablet, may increase to 1 tablet if not causing significant drowsiness  Please alternate ice and heat every few hours  We are drawing blood work, we will call you if any of these come back abnormal  Please work on getting set up with a primary care  Please return if symptoms are worsening, developing abdominal pain, fever, nausea, vomiting, dizziness, lightheadedness, blood in stools, loss of bowel or bladder control

## 2017-11-18 NOTE — ED Provider Notes (Signed)
[facility]    CSN: [id number] Arrival date & time: 11/18/17  1731     History   Chief Complaint Chief Complaint  Patient presents with  . Back Pain    HPI Alison Coffey is a 32 y.o. female no contributing past medical history presenting today for evaluation of back pain and intermittent abdominal pain.  Patient states that for the past month she has had back pain.  Back pain is located bilaterally.  Denies any specific injury or increase in activity.  Denies numbness or tingling or radiation into the legs.  Denies loss of bowel or bladder control, denies saddle anesthesia.  Denies associated urinary symptoms of dysuria or increased frequency.  Denies any vaginal symptoms of abnormal vaginal discharge, notes that she has discharge at baseline.  Denies vaginal irritation or itching.  She will have an occasional sharp pain in her lower abdomen.  She is eating and drinking like normal without any nausea or vomiting.  Eating does not worsen abdominal pain or illicit abdominal pain.  She is taking Tylenol for her back pain without relief.  Last menstrual period was 6/5, noted that she had spotting from 7/1 to 7/30, but did not have a full cycle.  Denies concern for STD, states that she was recently tested and has not had any new partners.  HPI  Past Medical History:  Diagnosis Date  . Abnormal Pap smear 2005  . Anemia   . Depression   . Headache(784.0)   . Hypertension   . Pregnancy induced hypertension   . Preterm labor   . Sickle cell trait [facility])     Patient Active Problem List   Diagnosis Date Noted  . Preeclampsia in postpartum period 07/31/2013  . Active labor 07/25/2013  . Short cervix affecting pregnancy 06/08/2013  . History of preterm delivery, currently pregnant 12/15/2012  . Supervision of high risk pregnancy in first trimester 12/15/2012  . Hypertension complicating pregnancy, childbirth and puerperium, antepartum 12/15/2012  . History of herpes simplex  type 2 infection 12/15/2012    Past Surgical History:  Procedure Laterality Date  . DILATION AND CURETTAGE OF UTERUS    . INDUCED ABORTION    . INDUCED ABORTION      OB History    Gravida  10   Para  5   Term  4   Preterm  1   AB  5   Living  5     SAB      TAB  5   Ectopic      Multiple      Live Births  5            Home Medications    Prior to Admission medications   Medication Sig Start Date End Date Taking? Authorizing Provider  cyclobenzaprine (FLEXERIL) 10 MG tablet Take 1 tablet (10 mg total) by mouth 2 (two) times daily as needed for muscle spasms. 11/18/17   Veeda Virgo C, PA-C  naproxen (NAPROSYN) 500 MG tablet Take 1 tablet (500 mg total) by mouth 2 (two) times daily. 11/18/17   Tyse Auriemma, Junius Creamer, PA-C    Family History Family History  Problem Relation Age of Onset  . Hypertension Mother   . Hypertension Father   . Diabetes Maternal Aunt   . Sickle cell trait Daughter   . Other Neg Hx     Social History Social History   Tobacco Use  . Smoking status: Current Every Day Smoker    Packs/day: 0.50  Types: Cigarettes  . Smokeless tobacco: Never Used  Substance Use Topics  . Alcohol use: Yes    Comment: occ  . Drug use: No     Allergies   Oxycodone   Review of Systems Review of Systems  Constitutional: Negative for fever.  Respiratory: Negative for shortness of breath.   Cardiovascular: Negative for chest pain.  Gastrointestinal: Positive for abdominal pain. Negative for diarrhea, nausea and vomiting.  Genitourinary: Positive for menstrual problem. Negative for dysuria, flank pain, genital sores, hematuria, vaginal bleeding, vaginal discharge and vaginal pain.  Musculoskeletal: Positive for back pain and myalgias.  Skin: Negative for rash.  Neurological: Negative for dizziness, light-headedness and headaches.     Physical Exam Triage Vital Signs ED Triage Vitals  Enc Vitals Group     BP 11/18/17 1749 120/79      Pulse Rate 11/18/17 1749 81     Resp 11/18/17 1749 18     Temp 11/18/17 1749 99.5 F (37.5 C)     Temp Source 11/18/17 1749 Oral     SpO2 11/18/17 1749 97 %     Weight --      Height --      Head Circumference --      Peak Flow --      Pain Score 11/18/17 1746 7     Pain Loc --      Pain Edu? --      Excl. in GC? --    No data found.  Updated Vital Signs BP 120/79 (BP Location: Left Arm)   Pulse 81   Temp 99.5 F (37.5 C) (Oral)   Resp 18   LMP 09/22/2017   SpO2 97%   Visual Acuity Right Eye Distance:   Left Eye Distance:   Bilateral Distance:    Right Eye Near:   Left Eye Near:    Bilateral Near:     Physical Exam  Constitutional: She appears well-developed and well-nourished. No distress.  HENT:  Head: Normocephalic and atraumatic.  Eyes: Conjunctivae are normal.  Neck: Neck supple.  Cardiovascular: Normal rate and regular rhythm.  No murmur heard. Pulmonary/Chest: Effort normal and breath sounds normal. No respiratory distress.  Abdominal: Soft. There is no tenderness.  Nontender to light and deep palpation throughout all 4 quadrants  Musculoskeletal: She exhibits no edema.  Mild tenderness to palpation of bilateral lumbar musculature, nontender to palpation of thoracic and lumbar spine midline.  Negative straight leg raise bilaterally.  Strength 5/5 and equal bilaterally at hips and knees.  Neurological: She is alert.  Skin: Skin is warm and dry.  Psychiatric: She has a normal mood and affect.  Nursing note and vitals reviewed.    UC Treatments / Results  Labs (all labs ordered are listed, but only abnormal results are displayed) Labs Reviewed  CBC - Abnormal; Notable for the following components:      Result Value   RBC 5.31 (*)    All other components within normal limits  POCT URINALYSIS DIP (DEVICE) - Abnormal; Notable for the following components:   Bilirubin Urine SMALL (*)    Hgb urine dipstick TRACE (*)    All other components within  normal limits  COMPREHENSIVE METABOLIC PANEL  HCG, QUANTITATIVE, PREGNANCY  POCT PREGNANCY, URINE    EKG None  Radiology No results found.  Procedures Procedures (including critical care time)  Medications Ordered in UC Medications - No data to display  Initial Impression / Assessment and Plan / UC Course  I have  reviewed the triage vital signs and the nursing notes.  Pertinent labs & imaging results that were available during my care of the patient were reviewed by me and considered in my medical decision making (see chart for details).     Pregnancy test negative, UA negative for signs of UTI.  UA did show bilirubin and elevated urobilinogen.  Will draw blood work to check LFTs/CMP/CBC.  Lab work unremarkable because of excretion of bilirubin.  Will have patient follow-up for repeat UA in approximately 3 to 4 weeks.  Back pain likely muscular, will recommend anti-inflammatories, muscle relaxer.  Continue to monitor symptoms, work on getting set up with a PCP. Discussed strict return precautions. Patient verbalized understanding and is agreeable with plan.  Final Clinical Impressions(s) / UC Diagnoses   Final diagnoses:  Strain of lumbar region, initial encounter     Discharge Instructions     Use anti-inflammatories for pain/swelling. You may take Naprosyn twice daily with food. You may supplement with Tylenol [phone] mg every 8 hours.   You may use flexeril as needed to help with pain. This is a muscle relaxer and causes sedation- please use only at bedtime or when you will be home and not have to drive/work-start with taking 1/2 tablet, may increase to 1 tablet if not causing significant drowsiness  Please alternate ice and heat every few hours  We are drawing blood work, we will call you if any of these come back abnormal  Please work on getting set up with a primary care  Please return if symptoms are worsening, developing abdominal pain, fever, nausea, vomiting,  dizziness, lightheadedness, blood in stools, loss of bowel or bladder control   ED Prescriptions    Medication Sig Dispense Auth. Provider   naproxen (NAPROSYN) 500 MG tablet Take 1 tablet (500 mg total) by mouth 2 (two) times daily. 30 tablet Falon Flinchum C, PA-C   cyclobenzaprine (FLEXERIL) 10 MG tablet Take 1 tablet (10 mg total) by mouth 2 (two) times daily as needed for muscle spasms. 20 tablet Kylian Loh, [place] C, PA-C     Controlled Substance Prescriptions [place] Controlled Substance Registry consulted? Not Applicable   Lew DawesWieters, Toyoko Silos C, [place]PA-C 11/18/17 2300

## 2017-11-18 NOTE — ED Triage Notes (Signed)
Back and abdominal pain for a month.  Pain worsened over the last 2 weeks.  Denies burning with urination. Denies any abnormal vaginal discharge.

## 2017-11-19 ENCOUNTER — Telehealth (HOSPITAL_COMMUNITY): Payer: Self-pay

## 2017-11-19 NOTE — Telephone Encounter (Signed)
Pt called and given Hallie Pa instruction to follow up in 3-4 weeks for a UA regarding bilirubin present in urine. All other labs are normal. Pt verbalized understanding.

## 2017-11-20 ENCOUNTER — Other Ambulatory Visit: Payer: Self-pay

## 2017-11-20 ENCOUNTER — Emergency Department (HOSPITAL_BASED_OUTPATIENT_CLINIC_OR_DEPARTMENT_OTHER)
Admission: EM | Admit: 2017-11-20 | Discharge: 2017-11-20 | Disposition: A | Discharge status: Home / Self Care | Payer: Self-pay | Attending: Emergency Medicine | Admitting: Emergency Medicine

## 2017-11-20 ENCOUNTER — Encounter (HOSPITAL_BASED_OUTPATIENT_CLINIC_OR_DEPARTMENT_OTHER): Payer: Self-pay | Admitting: Emergency Medicine

## 2017-11-20 ENCOUNTER — Emergency Department (HOSPITAL_BASED_OUTPATIENT_CLINIC_OR_DEPARTMENT_OTHER): Payer: Self-pay

## 2017-11-20 DIAGNOSIS — N83202 Unspecified ovarian cyst, left side: Secondary | ICD-10-CM | POA: Insufficient documentation

## 2017-11-20 DIAGNOSIS — I10 Essential (primary) hypertension: Secondary | ICD-10-CM | POA: Insufficient documentation

## 2017-11-20 DIAGNOSIS — M545 Low back pain: Secondary | ICD-10-CM | POA: Insufficient documentation

## 2017-11-20 DIAGNOSIS — R1030 Lower abdominal pain, unspecified: Secondary | ICD-10-CM

## 2017-11-20 DIAGNOSIS — B9689 Other specified bacterial agents as the cause of diseases classified elsewhere: Secondary | ICD-10-CM | POA: Insufficient documentation

## 2017-11-20 DIAGNOSIS — F17210 Nicotine dependence, cigarettes, uncomplicated: Secondary | ICD-10-CM | POA: Insufficient documentation

## 2017-11-20 DIAGNOSIS — N760 Acute vaginitis: Secondary | ICD-10-CM | POA: Insufficient documentation

## 2017-11-20 LAB — CBC WITH DIFFERENTIAL/PLATELET
BASOS PCT: 0 %
Basophils Absolute: 0 10*3/uL (ref 0.0–0.1)
EOS ABS: 0.1 10*3/uL (ref 0.0–0.7)
Eosinophils Relative: 1 %
HEMATOCRIT: 40.1 % (ref 36.0–46.0)
Hemoglobin: 14.6 g/dL (ref 12.0–15.0)
Lymphocytes Relative: 42 %
Lymphs Abs: 2.4 10*3/uL (ref 0.7–4.0)
MCH: 28.2 pg (ref 26.0–34.0)
MCHC: 36.4 g/dL — AB (ref 30.0–36.0)
MCV: 77.4 fL — ABNORMAL LOW (ref 78.0–100.0)
MONO ABS: 0.6 10*3/uL (ref 0.1–1.0)
MONOS PCT: 10 %
NEUTROS ABS: 2.7 10*3/uL (ref 1.7–7.7)
Neutrophils Relative %: 47 %
Platelets: 162 10*3/uL (ref 150–400)
RBC: 5.18 MIL/uL — ABNORMAL HIGH (ref 3.87–5.11)
RDW: 13.1 % (ref 11.5–15.5)
WBC: 5.8 10*3/uL (ref 4.0–10.5)

## 2017-11-20 LAB — WET PREP, GENITAL
Sperm: NONE SEEN
TRICH WET PREP: NONE SEEN
YEAST WET PREP: NONE SEEN

## 2017-11-20 LAB — URINALYSIS, ROUTINE W REFLEX MICROSCOPIC
BILIRUBIN URINE: NEGATIVE
GLUCOSE, UA: NEGATIVE mg/dL
KETONES UR: NEGATIVE mg/dL
Leukocytes, UA: NEGATIVE
NITRITE: NEGATIVE
PH: 7 (ref 5.0–8.0)
Protein, ur: NEGATIVE mg/dL
Specific Gravity, Urine: 1.015 (ref 1.005–1.030)

## 2017-11-20 LAB — COMPREHENSIVE METABOLIC PANEL
ALBUMIN: 4.2 g/dL (ref 3.5–5.0)
ALK PHOS: 57 U/L (ref 38–126)
ALT: 12 U/L (ref 0–44)
ANION GAP: 7 (ref 5–15)
AST: 16 U/L (ref 15–41)
BILIRUBIN TOTAL: 0.9 mg/dL (ref 0.3–1.2)
BUN: 12 mg/dL (ref 6–20)
CO2: 27 mmol/L (ref 22–32)
Calcium: 8.7 mg/dL — ABNORMAL LOW (ref 8.9–10.3)
Chloride: 103 mmol/L (ref 98–111)
Creatinine, Ser: 0.8 mg/dL (ref 0.44–1.00)
GFR calc Af Amer: >60 mL/min (ref >60)
Glucose, Bld: 91 mg/dL (ref 70–99)
Potassium: 3.8 mmol/L (ref 3.5–5.1)
Sodium: 137 mmol/L (ref 135–145)
TOTAL PROTEIN: 7.4 g/dL (ref 6.5–8.1)

## 2017-11-20 LAB — URINALYSIS, MICROSCOPIC (REFLEX)

## 2017-11-20 LAB — PREGNANCY, URINE: Preg Test, Ur: NEGATIVE

## 2017-11-20 LAB — LIPASE, BLOOD: Lipase: 24 U/L (ref 11–51)

## 2017-11-20 MED ORDER — KETOROLAC TROMETHAMINE 30 MG/ML IJ SOLN
30.0000 mg | Freq: Once | INTRAMUSCULAR | Status: AC
  Administered 2017-11-20: 30 mg via INTRAVENOUS
  Filled 2017-11-20: qty 1

## 2017-11-20 MED ORDER — METRONIDAZOLE 500 MG PO TABS: 500.0000 mg | ORAL_TABLET | Freq: Two times a day (BID) | ORAL | 0 refills | Status: DC

## 2017-11-20 NOTE — ED Triage Notes (Signed)
R low back pain radiating into flank x 3 days. Seen by UC ealier this week. Pain persists.

## 2017-11-20 NOTE — Discharge Instructions (Signed)
You can take Tylenol or Ibuprofen as directed for pain. You can alternate Tylenol and Ibuprofen every 4 hours. If you take Tylenol at 1pm, then you can take Ibuprofen at 5pm. Then you can take Tylenol again at 9pm.   Continue taking the Flexeril that you are previously prescribed.    As we discussed, [facility] for repeat ultrasound for evaluation of your ovarian cyst and the suspected polyp seen in the uterus.  Return the emergency department for any fevers, worsening pain, vaginal bleeding, difficulty urinating, vomiting or any other worsening or concerning symptoms.

## 2017-11-20 NOTE — ED Notes (Signed)
Patient transported to Ultrasound 

## 2017-11-20 NOTE — ED Notes (Signed)
Patient encouraged to go to the restroom  - she states that she is not able at this time. The patient turns onto her side and asking for the light to be turned off  - had to wake the patient up to assess her

## 2017-11-20 NOTE — ED Provider Notes (Signed)
[facility] EMERGENCY DEPARTMENT Provider Note   CSN: [id number] Arrival date & time: 11/20/17  0919     History   Chief Complaint Chief Complaint  Patient presents with  . Back Pain    HPI Alison Coffey is a 32 y.o. female possible history of hypertension, depression who presents for evaluation of back and lower abdominal pain that is been ongoing for the last month.  Patient reports that when pain initially started, she thought it was menstrual cramping.  She states that her last menstrual cycle was June 24.  Patient states that she did not have a period in July.  She states she did have some vaginal spotting the beginning of July but did not have a menstrual cycle.  Patient reports that she also started having some lower abdominal cramping that she states was intermittent.  She did not have any nausea, vomiting, diarrhea.  She was able to eat and drink without any difficulty.  Patient states that the back pain is in the lower back and wraps around to the right side.  She states that she did not have any preceding trauma, injury, fall.  She was seen in urgent care 3 days ago for evaluation of symptoms.  At that time, she was prescribed anti-inflammatories and muscle relaxers.  She states she has been taking the muscle relaxers with minimal improvement.  Patient states that she is still continuing to have pain from being repeated by ED visit.  Patient denies any fevers, difficulty breathing, chest pain, urinary complaints, vaginal bleeding.  She does states she occasionally will have some vaginal discharge.  Denies fevers, weight loss, numbness/weakness of upper and lower extremities, bowel/bladder incontinence, saddle anesthesia, history of back surgery, history of IVDA.   The history is provided by the patient.    Past Medical History:  Diagnosis Date  . Abnormal Pap smear 2005  . Anemia   . Depression   . Headache(784.0)   . Hypertension   . Pregnancy induced hypertension    . Preterm labor   . Sickle cell trait [facility])     Patient Active Problem List   Diagnosis Date Noted  . Preeclampsia in postpartum period 07/31/2013  . Active labor 07/25/2013  . Short cervix affecting pregnancy 06/08/2013  . History of preterm delivery, currently pregnant 12/15/2012  . Supervision of high risk pregnancy in first trimester 12/15/2012  . Hypertension complicating pregnancy, childbirth and puerperium, antepartum 12/15/2012  . 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 DILATION AND CURETTAGE OF UTERUS    . INDUCED ABORTION    . INDUCED ABORTION       OB History    Gravida  10   Para  5   Term  4   Preterm  1   AB  5   Living  5     SAB      TAB  5   Ectopic      Multiple      Live Births  5            Home Medications    Prior to Admission medications   Medication Sig Start Date End Date Taking? Authorizing Provider  cyclobenzaprine (FLEXERIL) 10 MG tablet Take 1 tablet (10 mg total) by mouth 2 (two) times daily as needed for muscle spasms. 11/18/17   Wieters, Hallie C, PA-C  metroNIDAZOLE (FLAGYL) 500 MG tablet Take 1 tablet (500 mg total)  by mouth 2 (two) times daily. 11/20/17   Maxwell CaulLayden, Cleola Perryman A, PA-C  naproxen (NAPROSYN) 500 MG tablet Take 1 tablet (500 mg total) by mouth 2 (two) times daily. 11/18/17   Wieters, Junius CreamerHallie C, PA-C    Family History Family History  Problem Relation Age of Onset  . Hypertension Mother   . Hypertension Father   . Diabetes Maternal Aunt   . Sickle cell trait Daughter   . Other Neg Hx     Social History Social History   Tobacco Use  . Smoking status: Current Every Day Smoker    Packs/day: 0.50    Types: Cigarettes  . Smokeless tobacco: Never Used  Substance Use Topics  . Alcohol use: Yes    Comment: occ  . Drug use: No     Allergies   Oxycodone   Review of Systems Review of Systems  Constitutional: Negative for fever.    Respiratory: Negative for cough and shortness of breath.   Cardiovascular: Negative for chest pain.  Gastrointestinal: Positive for abdominal pain. Negative for nausea and vomiting.  Genitourinary: Positive for flank pain and vaginal discharge. Negative for dysuria, hematuria and vaginal bleeding.  Musculoskeletal: Positive for back pain.  Neurological: Negative for headaches.  All other systems reviewed and are negative.    Physical Exam Updated Vital Signs BP 131/80   Pulse 69   Temp 98.3 F (36.8 C) (Oral)   Resp 18   Ht 5' 6.5" (1.689 m)   Wt 81.2 kg (179 lb)   LMP 09/22/2017   SpO2 99%   BMI 28.46 kg/m   Physical Exam  Constitutional: She is oriented to person, place, and time. She appears well-developed and well-nourished.  Sleeping comfortably on examination table.  HENT:  Head: Normocephalic and atraumatic.  Mouth/Throat: Oropharynx is clear and moist and mucous membranes are normal.  Eyes: Pupils are equal, round, and reactive to light. Conjunctivae, EOM and lids are normal.  Neck: Full passive range of motion without pain.  Cardiovascular: Normal rate, regular rhythm, normal heart sounds and normal pulses. Exam reveals no gallop and no friction rub.  No murmur heard. Pulmonary/Chest: Effort normal and breath sounds normal.  Abdominal: Soft. Normal appearance. There is tenderness in the right lower quadrant, suprapubic area and left lower quadrant. There is CVA tenderness (Right-sided). There is no rigidity, no guarding and no tenderness at McBurney's point.  Abdomen is soft, nondistended.  Patient with diffuse tenderness to the lower abdomen.  She does have some right lower quadrant tenderness but this is not at McBurney's point.  Patient also with right-sided CVA tenderness.  No rigidity, guarding.  Genitourinary: Uterus normal. Cervix exhibits no motion tenderness and no friability. Right adnexum displays tenderness. Right adnexum displays no mass. Left adnexum  displays no mass and no tenderness. Vaginal discharge found.  Genitourinary Comments: The exam was performed with a chaperone present. Normal external female genitalia. No lesions, rash, or sores.  White vaginal discharge noted in the vaginal vault.  No cervical discharge.  No CMT, friability.  There is some right adnexal tenderness.  No right adnexal mass.  No left adnexal mass or tenderness.  Musculoskeletal: Normal range of motion.  Neurological: She is alert and oriented to person, place, and time.  Skin: Skin is warm and dry. Capillary refill takes less than 2 seconds.  Psychiatric: She has a normal mood and affect. Her speech is normal.  Nursing note and vitals reviewed.    ED Treatments / Results  Labs (all labs  ordered are listed, but only abnormal results are displayed) Labs Reviewed  WET PREP, GENITAL - Abnormal; Notable for the following components:      Result Value   Clue Cells Wet Prep HPF POC PRESENT (*)    WBC, Wet Prep HPF POC MODERATE (*)    All other components within normal limits  COMPREHENSIVE METABOLIC PANEL - Abnormal; Notable for the following components:   Calcium 8.7 (*)    All other components within normal limits  CBC WITH DIFFERENTIAL/PLATELET - Abnormal; Notable for the following components:   RBC 5.18 (*)    MCV 77.4 (*)    MCHC 36.4 (*)    All other components within normal limits  URINALYSIS, ROUTINE W REFLEX MICROSCOPIC - Abnormal; Notable for the following components:   APPearance HAZY (*)    Hgb urine dipstick TRACE (*)    All other components within normal limits  URINALYSIS, MICROSCOPIC (REFLEX) - Abnormal; Notable for the following components:   Bacteria, UA MANY (*)    All other components within normal limits  LIPASE, BLOOD  PREGNANCY, URINE  GC/CHLAMYDIA PROBE AMP ([place]) NOT AT [facility]    EKG None  Radiology US Transvaginal Non-ob  Result Date: 11/20/2017 CLINICAL DATA:  Severe right pelvic and bilateral flank pain for 1  month. Clinical suspicion for ovarian torsion. EXAM: TRANSABDOMINAL AND TRANSVAGINAL ULTRASOUND OF PELVIS DOPPLER ULTRASOUND OF OVARIES TECHNIQUE: Both transabdominal and transvaginal ultrasound examinations of the pelvis were performed. Transabdominal technique was performed for global imaging of the pelvis including uterus, ovaries, adnexal regions, and pelvic cul-de-sac. It was necessary to proceed with endovaginal exam following the transabdominal exam to visualize the endometrium and ovaries. Color and duplex Doppler ultrasound was utilized to evaluate blood flow to the ovaries. COMPARISON:  None. FINDINGS: Uterus Measurements: 7.7 x 4.4 x 4.8 cm. Retroverted. No fibroids or other mass visualized. Endometrium Thickness: 8 mm. Several tiny echogenic foci are seen within the upper portion of the endometrial cavity, without acoustic shadowing. This could represent dystrophic calcification or endometrial polyps. Right ovary Measurements: 3.5 x 2.2 x 2.1 cm. Normal appearance/no adnexal mass. Left ovary Measurements: 3.9 x 2.8 x 2.9 cm. A benign-appearing cyst is seen along the margin of the left ovary which measures 4.6 x 3.3 x 3.4 cm. This is most likely physiologic in a reproductive age female. Pulsed Doppler evaluation of both ovaries demonstrates normal low-resistance arterial and venous waveforms. Other findings No abnormal free fluid. IMPRESSION: 4.6 cm benign-appearing left ovarian/adnexal cystic lesion, likely physiologic in a reproductive age female. Recommend follow-up with transvaginal pelvic ultrasound in 6-12 weeks. No sonographic evidence for ovarian torsion. Several tiny echogenic foci within the endometrial cavity, which could represent dystrophic calcification or endometrial polyps. Consider sonohysterogram for further evaluation. Electronically Signed   By: Myles Rosenthal M.D.   On: 11/20/2017 12:43   US Pelvis Complete  Result Date: 11/20/2017 CLINICAL DATA:  Severe right pelvic and bilateral  flank pain for 1 month. Clinical suspicion for ovarian torsion. EXAM: TRANSABDOMINAL AND TRANSVAGINAL ULTRASOUND OF PELVIS DOPPLER ULTRASOUND OF OVARIES TECHNIQUE: Both transabdominal and transvaginal ultrasound examinations of the pelvis were performed. Transabdominal technique was performed for global imaging of the pelvis including uterus, ovaries, adnexal regions, and pelvic cul-de-sac. It was necessary to proceed with endovaginal exam following the transabdominal exam to visualize the endometrium and ovaries. Color and duplex Doppler ultrasound was utilized to evaluate blood flow to the ovaries. COMPARISON:  None. FINDINGS: Uterus Measurements: 7.7 x 4.4 x 4.8 cm. Retroverted.  No fibroids or other mass visualized. Endometrium Thickness: 8 mm. Several tiny echogenic foci are seen within the upper portion of the endometrial cavity, without acoustic shadowing. This could represent dystrophic calcification or endometrial polyps. Right ovary Measurements: 3.5 x 2.2 x 2.1 cm. Normal appearance/no adnexal mass. Left ovary Measurements: 3.9 x 2.8 x 2.9 cm. A benign-appearing cyst is seen along the margin of the left ovary which measures 4.6 x 3.3 x 3.4 cm. This is most likely physiologic in a reproductive age female. Pulsed Doppler evaluation of both ovaries demonstrates normal low-resistance arterial and venous waveforms. Other findings No abnormal free fluid. IMPRESSION: 4.6 cm benign-appearing left ovarian/adnexal cystic lesion, likely physiologic in a reproductive age female. Recommend follow-up with transvaginal pelvic ultrasound in 6-12 weeks. No sonographic evidence for ovarian torsion. Several tiny echogenic foci within the endometrial cavity, which could represent dystrophic calcification or endometrial polyps. Consider sonohysterogram for further evaluation. Electronically Signed   By: Myles Rosenthal M.D.   On: 11/20/2017 12:43   [place] Art/ven Flow Abd Pelv Doppler  Result Date: 11/20/2017 CLINICAL DATA:  Severe  right pelvic and bilateral flank pain for 1 month. Clinical suspicion for ovarian torsion. EXAM: TRANSABDOMINAL AND TRANSVAGINAL ULTRASOUND OF PELVIS DOPPLER ULTRASOUND OF OVARIES TECHNIQUE: Both transabdominal and transvaginal ultrasound examinations of the pelvis were performed. Transabdominal technique was performed for global imaging of the pelvis including uterus, ovaries, adnexal regions, and pelvic cul-de-sac. It was necessary to proceed with endovaginal exam following the transabdominal exam to visualize the endometrium and ovaries. Color and duplex Doppler ultrasound was utilized to evaluate blood flow to the ovaries. COMPARISON:  None. FINDINGS: Uterus Measurements: 7.7 x 4.4 x 4.8 cm. Retroverted. No fibroids or other mass visualized. Endometrium Thickness: 8 mm. Several tiny echogenic foci are seen within the upper portion of the endometrial cavity, without acoustic shadowing. This could represent dystrophic calcification or endometrial polyps. Right ovary Measurements: 3.5 x 2.2 x 2.1 cm. Normal appearance/no adnexal mass. Left ovary Measurements: 3.9 x 2.8 x 2.9 cm. A benign-appearing cyst is seen along the margin of the left ovary which measures 4.6 x 3.3 x 3.4 cm. This is most likely physiologic in a reproductive age female. Pulsed Doppler evaluation of both ovaries demonstrates normal low-resistance arterial and venous waveforms. Other findings No abnormal free fluid. IMPRESSION: 4.6 cm benign-appearing left ovarian/adnexal cystic lesion, likely physiologic in a reproductive age female. Recommend follow-up with transvaginal pelvic ultrasound in 6-12 weeks. No sonographic evidence for ovarian torsion. Several tiny echogenic foci within the endometrial cavity, which could represent dystrophic calcification or endometrial polyps. Consider sonohysterogram for further evaluation. Electronically Signed   By: Myles Rosenthal M.D.   On: 11/20/2017 12:43   Ct Renal Stone Study  Result Date:  11/20/2017 CLINICAL DATA:  32 year old female with low back pain radiating into the right flank for the past 3 days. Possible renal stones. EXAM: CT ABDOMEN AND PELVIS WITHOUT CONTRAST TECHNIQUE: Multidetector CT imaging of the abdomen and pelvis was performed following the standard protocol without IV contrast. COMPARISON:  None. FINDINGS: Lower chest: The lung bases are clear. There are a few small circumscribed lucencies in the lungs bilaterally which may represent mild centrilobular emphysema, or small benign pulmonary cysts. Visualized cardiac structures are within normal limits for size. No pericardial effusion. Unremarkable visualized distal thoracic esophagus. Hepatobiliary: Normal hepatic contour and morphology. No discrete hepatic lesions. Normal appearance of the gallbladder. No intra or extrahepatic biliary ductal dilatation. Pancreas: Unremarkable. No pancreatic ductal dilatation or surrounding inflammatory changes. Spleen:  Normal in size without focal abnormality. Adrenals/Urinary Tract: Adrenal glands are unremarkable. Kidneys are normal, without renal calculi, focal lesion, or hydronephrosis. Bladder is unremarkable. Stomach/Bowel: Stomach is within normal limits. Appendix appears normal. No evidence of bowel wall thickening, distention, or inflammatory changes. Vascular/Lymphatic: Limited evaluation in the absence of intravenous contrast. No aneurysm or significant atherosclerotic plaque. No suspicious lymphadenopathy. Reproductive: Water attenuation cystic structure in the left pelvic cul-de-sac likely represents an ovarian cyst. Unremarkable uterus and right adnexa. The bladder is unremarkable. Other: No abdominal wall hernia or abnormality. No abdominopelvic ascites. Musculoskeletal: No acute fracture or aggressive appearing lytic or blastic osseous lesion. IMPRESSION: 1. No acute abnormality within the abdomen or pelvis. 2. Mild centrilobular pulmonary emphysema or small benign bilateral  pulmonary cysts. 3. Probable left ovarian cyst. Electronically Signed   By: Malachy Moan M.D.   On: 11/20/2017 15:00    Procedures Procedures (including critical care time)  Medications Ordered in ED Medications  ketorolac (TORADOL) 30 MG/ML injection 30 mg (30 mg Intravenous Given 11/20/17 1257)     Initial Impression / Assessment and Plan / ED Course  I have reviewed the triage vital signs and the nursing notes.  Pertinent labs & imaging results that were available during my care of the patient were reviewed by me and considered in my medical decision making (see chart for details).     32 year old female who presents for evaluation of back, lower abdominal pain that has been ongoing for the last month.  No associated vomiting, fever, diarrhea.  Was seen in urgent care 3 days ago and prescribed anti-inflammatories and muscle relaxers.  Reports she is still having pain, prompting repeat ED visit. Patient is afebrile, non-toxic appearing, sitting comfortably on examination table. Vital signs reviewed and stable.  On exam, patient has diffuse muscular tenderness to the lower lumbar region and diffuse lower abdominal tenderness.  GU exam does show right adnexal tenderness.  No mass noted.  No left adnexal mass or tenderness.  No CMT, friability that would suggest PID.  History/physical exam is not concerning for appendicitis or diverticulitis.  Consider ovarian cyst versus dysfunctional uterine bleeding versus pregnancy versus kidney stone.  Plan to check basic labs.  UA shows trace hemoglobin.  No leukocytes.  Lipase unremarkable.  The CMP unremarkable.  CBC without any significant leukocytosis, anemia.    U/S shows a 4.6 cm benign-appearing left ovarian adnexal cystic lesion likely physiologic.  Commend follow-up evaluation with ultrasound in 6 to 12 weeks.  No evidence of torsion.  Discussed results with patient.  Patient still having some pain to the right lower quadrant.  No known  ovarian cyst seen on ultrasound on the right side.  Given that she is still having pain, will plan for CT renal study for evaluation of kidney stone.  I do not suspect appendicitis given history/physical exam and the fact this is been ongoing for the last month.  Low suspicion for kidney stone given her tenderness, also consideration.  Wet Prep positive for clue cells.  Patient does have discharge noted in the vaginal vault.  We will plan to treat accordingly.  CT scan shows no acute intra-abdominal abnormality.  Discussed results with patient.  Vital signs are stable.  We will plan to treat for BV.  Encourage OB/GYN follow-up for evaluation of her ovarian cyst.  Encourage her at home supportive therapies. Patient had ample opportunity for questions and discussion. All patient's questions were answered with full understanding. Strict return precautions discussed. Patient expresses understanding  and agreement to plan.    Final Clinical Impressions(s) / ED Diagnoses   Final diagnoses:  Cyst of left ovary  Acute bilateral low back pain, with sciatica presence unspecified  Lower abdominal pain  Bacterial vaginitis    ED Discharge Orders        Ordered    metroNIDAZOLE (FLAGYL) 500 MG tablet  2 times daily,   Status:  Discontinued     11/20/17 1513    metroNIDAZOLE (FLAGYL) 500 MG tablet  2 times daily     11/20/17 1513       Rosana Hoes 11/20/17 1725    Tilden Fossa, MD 11/21/17 1623

## 2017-11-22 ENCOUNTER — Encounter (HOSPITAL_COMMUNITY): Payer: Self-pay | Admitting: *Deleted

## 2017-11-22 ENCOUNTER — Inpatient Hospital Stay (HOSPITAL_COMMUNITY)
Admission: AD | Admit: 2017-11-22 | Discharge: 2017-11-22 | Disposition: A | Discharge status: Home / Self Care | Payer: Medicaid Other | Source: Ambulatory Visit | Attending: Obstetrics & Gynecology | Admitting: Obstetrics & Gynecology

## 2017-11-22 DIAGNOSIS — F329 Major depressive disorder, single episode, unspecified: Secondary | ICD-10-CM | POA: Insufficient documentation

## 2017-11-22 DIAGNOSIS — Z8249 Family history of ischemic heart disease and other diseases of the circulatory system: Secondary | ICD-10-CM | POA: Insufficient documentation

## 2017-11-22 DIAGNOSIS — Z885 Allergy status to narcotic agent status: Secondary | ICD-10-CM | POA: Insufficient documentation

## 2017-11-22 DIAGNOSIS — F17210 Nicotine dependence, cigarettes, uncomplicated: Secondary | ICD-10-CM | POA: Insufficient documentation

## 2017-11-22 DIAGNOSIS — I10 Essential (primary) hypertension: Secondary | ICD-10-CM | POA: Insufficient documentation

## 2017-11-22 DIAGNOSIS — N83202 Unspecified ovarian cyst, left side: Secondary | ICD-10-CM

## 2017-11-22 DIAGNOSIS — R102 Pelvic and perineal pain: Secondary | ICD-10-CM | POA: Diagnosis present

## 2017-11-22 DIAGNOSIS — N840 Polyp of corpus uteri: Secondary | ICD-10-CM | POA: Insufficient documentation

## 2017-11-22 DIAGNOSIS — D573 Sickle-cell trait: Secondary | ICD-10-CM | POA: Insufficient documentation

## 2017-11-22 DIAGNOSIS — Z79899 Other long term (current) drug therapy: Secondary | ICD-10-CM | POA: Insufficient documentation

## 2017-11-22 DIAGNOSIS — N83209 Unspecified ovarian cyst, unspecified side: Secondary | ICD-10-CM | POA: Diagnosis present

## 2017-11-22 LAB — URINALYSIS, ROUTINE W REFLEX MICROSCOPIC
BILIRUBIN URINE: NEGATIVE
Glucose, UA: NEGATIVE mg/dL
HGB URINE DIPSTICK: NEGATIVE
Ketones, ur: NEGATIVE mg/dL
LEUKOCYTES UA: NEGATIVE
NITRITE: NEGATIVE
PH: 7 (ref 5.0–8.0)
Protein, ur: 30 mg/dL — AB
SPECIFIC GRAVITY, URINE: 1.015 (ref 1.005–1.030)

## 2017-11-22 LAB — GC/CHLAMYDIA PROBE AMP (~~LOC~~) NOT AT ARMC
CHLAMYDIA, DNA PROBE: NEGATIVE
NEISSERIA GONORRHEA: NEGATIVE

## 2017-11-22 LAB — POCT PREGNANCY, URINE: PREG TEST UR: NEGATIVE

## 2017-11-22 MED ORDER — KETOROLAC TROMETHAMINE 10 MG PO TABS: 10.0000 mg | ORAL_TABLET | Freq: Four times a day (QID) | ORAL | 0 refills | Status: DC | PRN

## 2017-11-22 MED ORDER — KETOROLAC TROMETHAMINE 60 MG/2ML IM SOLN
60.0000 mg | INTRAMUSCULAR | Status: AC
  Administered 2017-11-22: 60 mg via INTRAMUSCULAR
  Filled 2017-11-22: qty 2

## 2017-11-22 NOTE — MAU Note (Signed)
Pt reports she was seen in urgent care on Thursday, then at [facility] on Saturday. [facility]. States she cannot wait that long because the pain is so bad. Denies dysuria, vaginal bleeding,

## 2017-11-22 NOTE — MAU Provider Note (Signed)
History     CSN: [id number]  Arrival date and time: 11/22/17 1218   First Provider Initiated Contact with Patient 11/22/17 1453      Chief Complaint  Patient presents with  . Abdominal Pain   HPI  Ms.  Alison Coffey is a 32 y.o. year old [phone] non-pregnant female who presents to MAU reporting continuing lower RT pelvic pain that is the same as it was when she was seen on Saturday 11/20/2017. She was dx'd with a 4.9 cm LT ovarian physiologic cyst and endometrial polyps. She states her pain is "not any worse than it was on Saturday." She states the only time her pain went down was when she received "a shot that started with a 'T'." She wants to know why she has endometrial polyps and continued pain on the opposite side of where her ovarian cyst is located.  Past Medical History:  Diagnosis Date  . Abnormal Pap smear 2005  . Anemia   . Depression   . Headache(784.0)   . Hypertension   . Pregnancy induced hypertension   . Preterm labor   . Sickle cell trait [facility](HCC)     Past Surgical History:  Procedure Laterality Date  . DILATION AND CURETTAGE OF UTERUS    . INDUCED ABORTION    . INDUCED ABORTION      Family History  Problem Relation Age of Onset  . Hypertension Mother   . Hypertension Father   . Diabetes Maternal Aunt   . Sickle cell trait Daughter   . Other Neg Hx     Social History   Tobacco Use  . Smoking status: Current Every Day Smoker    Packs/day: 0.50    Types: Cigarettes  . Smokeless tobacco: Never Used  Substance Use Topics  . Alcohol use: Yes    Comment: occ  . Drug use: No    Allergies:  Allergies  Allergen Reactions  . Oxycodone     Percocet causes itching    Medications Prior to Admission  Medication Sig Dispense Refill Last Dose  . cyclobenzaprine (FLEXERIL) 10 MG tablet Take 1 tablet (10 mg total) by mouth 2 (two) times daily as needed for muscle spasms. 20 tablet 0   . metroNIDAZOLE (FLAGYL) 500 MG tablet Take 1 tablet (500 mg total)  by mouth 2 (two) times daily. 14 tablet 0   . naproxen (NAPROSYN) 500 MG tablet Take 1 tablet (500 mg total) by mouth 2 (two) times daily. 30 tablet 0     Review of Systems  Constitutional: Negative.   HENT: Negative.   Eyes: Negative.   Respiratory: Negative.   Cardiovascular: Negative.   Gastrointestinal: Negative.   Endocrine: Negative.   Genitourinary: Positive for pelvic pain (RT side).  Musculoskeletal: Negative.   Skin: Negative.   Allergic/Immunologic: Negative.   Neurological: Negative.   Hematological: Negative.   Psychiatric/Behavioral: Negative.    Physical Exam   Blood pressure 123/83, pulse 80, temperature 98.3 F (36.8 C), temperature source Oral, resp. rate 18, height 5\' 6"  (1.676 m), weight 180 lb 4 oz (81.8 kg), last menstrual period 10/18/2017, SpO2 100 %.  Physical Exam  Nursing note and vitals reviewed. Constitutional: She is oriented to person, place, and time. She appears well-developed and well-nourished.  HENT:  Head: Normocephalic and atraumatic.  Eyes: Pupils are equal, round, and reactive to light.  Neck: Normal range of motion.  Cardiovascular: Normal rate.  Respiratory: Effort normal.  GI: Soft.  Genitourinary:  Genitourinary Comments: Pelvic deferred  Musculoskeletal: Normal range of motion.  Neurological: She is alert and oriented to person, place, and time.  Skin: Skin is warm and dry.  Psychiatric: She has a normal mood and affect. Her behavior is normal. Judgment and thought content normal.    MAU Course  Procedures  MDM [facility] UPT Toradol 60 mg IM x 1 now -- pain rated 10/10 before; 2/10 after  Results for orders placed or performed during the hospital encounter of 11/22/17 (from the past 48 hour(s))  Urinalysis, Routine w reflex microscopic     Status: Abnormal   Collection Time: 11/22/17  1:52 PM  Result Value Ref Range   Color, Urine YELLOW YELLOW   APPearance CLEAR CLEAR   Specific Gravity, Urine 1.015 1.005 - 1.030   pH  7.0 5.0 - 8.0   Glucose, UA NEGATIVE NEGATIVE mg/dL   Hgb urine dipstick NEGATIVE NEGATIVE   Bilirubin Urine NEGATIVE NEGATIVE   Ketones, ur NEGATIVE NEGATIVE mg/dL   Protein, ur 30 (A) NEGATIVE mg/dL   Nitrite NEGATIVE NEGATIVE   Leukocytes, UA NEGATIVE NEGATIVE   RBC / HPF 0-5 0 - 5 RBC/hpf   WBC, UA 0-5 0 - 5 WBC/hpf   Bacteria, UA RARE (A) NONE SEEN   Squamous Epithelial / LPF 0-5 0 - 5   Mucus PRESENT     Comment: Performed at [facility], [address], [place], [place] [address]  Pregnancy, urine POC     Status: None   Collection Time: 11/22/17  1:57 PM  Result Value Ref Range   Preg Test, Ur NEGATIVE NEGATIVE    Comment:        THE SENSITIVITY OF THIS METHODOLOGY IS >24 mIU/mL     Assessment and Plan  Cyst of left ovary - LT side   Pelvic pain - RT side  - Rx for Toradol 10 mg every 6 hrs prn pain - Continue all meds Rx'd on 8/3 - Outpatient US PELVIS (TRANSABDOMINAL ONLY), US PELVIS TRANSVANGINAL NON-OB (TV ONLY) for 6 wks - F/U with MD in [facility] 8 wks or sooner, if pain unmanaged -- msg sent to [employer] to schedule - Patient verbalized an understanding of the plan of care and agrees.     Raelyn Mora, MSN, CNM 11/22/2017, 2:54 PM

## 2017-11-26 ENCOUNTER — Ambulatory Visit: Payer: Medicaid Other | Admitting: Neurology

## 2017-11-26 ENCOUNTER — Encounter

## 2017-12-11 ENCOUNTER — Inpatient Hospital Stay (HOSPITAL_COMMUNITY)
Admission: AD | Admit: 2017-12-11 | Discharge: 2017-12-12 | Disposition: A | Discharge status: Home / Self Care | Payer: Medicaid Other | Source: Ambulatory Visit | Attending: Obstetrics and Gynecology | Admitting: Obstetrics and Gynecology

## 2017-12-11 DIAGNOSIS — R102 Pelvic and perineal pain: Secondary | ICD-10-CM | POA: Insufficient documentation

## 2017-12-11 DIAGNOSIS — A6000 Herpesviral infection of urogenital system, unspecified: Secondary | ICD-10-CM

## 2017-12-11 DIAGNOSIS — N940 Mittelschmerz: Secondary | ICD-10-CM | POA: Insufficient documentation

## 2017-12-11 DIAGNOSIS — R1032 Left lower quadrant pain: Secondary | ICD-10-CM | POA: Insufficient documentation

## 2017-12-11 DIAGNOSIS — B3731 Acute candidiasis of vulva and vagina: Secondary | ICD-10-CM

## 2017-12-11 DIAGNOSIS — B373 Candidiasis of vulva and vagina: Secondary | ICD-10-CM | POA: Insufficient documentation

## 2017-12-11 DIAGNOSIS — B009 Herpesviral infection, unspecified: Secondary | ICD-10-CM | POA: Insufficient documentation

## 2017-12-11 NOTE — MAU Note (Signed)
Hx ovarian cyst on L side and knows polyps. 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 Laid down and pain got alittle better but not enough to sleep. No vag d/c. Just finished Flagyl and now has vaginal irritation. Denies dysuria

## 2017-12-12 ENCOUNTER — Encounter (HOSPITAL_COMMUNITY): Payer: Self-pay | Admitting: *Deleted

## 2017-12-12 DIAGNOSIS — R1032 Left lower quadrant pain: Secondary | ICD-10-CM

## 2017-12-12 LAB — URINALYSIS, ROUTINE W REFLEX MICROSCOPIC
Bacteria, UA: NONE SEEN
GLUCOSE, UA: NEGATIVE mg/dL
KETONES UR: 5 mg/dL — AB
Nitrite: NEGATIVE
PH: 5 (ref 5.0–8.0)
Protein, ur: 30 mg/dL — AB
Specific Gravity, Urine: 1.027 (ref 1.005–1.030)

## 2017-12-12 LAB — COMPREHENSIVE METABOLIC PANEL
ALBUMIN: 4.5 g/dL (ref 3.5–5.0)
ALT: 11 U/L (ref 0–44)
ANION GAP: 10 (ref 5–15)
AST: 16 U/L (ref 15–41)
Alkaline Phosphatase: 62 U/L (ref 38–126)
BILIRUBIN TOTAL: 1.1 mg/dL (ref 0.3–1.2)
BUN: 13 mg/dL (ref 6–20)
CHLORIDE: 100 mmol/L (ref 98–111)
CO2: 23 mmol/L (ref 22–32)
Calcium: 9.1 mg/dL (ref 8.9–10.3)
Creatinine, Ser: 0.97 mg/dL (ref 0.44–1.00)
GFR calc Af Amer: >60 mL/min (ref >60)
GFR calc non Af Amer: >60 mL/min (ref >60)
GLUCOSE: 96 mg/dL (ref 70–99)
POTASSIUM: 3.3 mmol/L — AB (ref 3.5–5.1)
SODIUM: 133 mmol/L — AB (ref 135–145)
TOTAL PROTEIN: 7.6 g/dL (ref 6.5–8.1)

## 2017-12-12 LAB — WET PREP, GENITAL
CLUE CELLS WET PREP: NONE SEEN
Sperm: NONE SEEN
Trich, Wet Prep: NONE SEEN

## 2017-12-12 LAB — CBC
HCT: 39.8 % (ref 36.0–46.0)
Hemoglobin: 14.6 g/dL (ref 12.0–15.0)
MCH: 28.9 pg (ref 26.0–34.0)
MCHC: 36.7 g/dL — ABNORMAL HIGH (ref 30.0–36.0)
MCV: 78.7 fL (ref 78.0–100.0)
PLATELETS: 196 10*3/uL (ref 150–400)
RBC: 5.06 MIL/uL (ref 3.87–5.11)
RDW: 13.1 % (ref 11.5–15.5)
WBC: 7.6 10*3/uL (ref 4.0–10.5)

## 2017-12-12 LAB — POCT PREGNANCY, URINE: Preg Test, Ur: NEGATIVE

## 2017-12-12 LAB — LIPASE, BLOOD: Lipase: 22 U/L (ref 11–51)

## 2017-12-12 MED ORDER — NAPROXEN 500 MG PO TABS: 500.0000 mg | ORAL_TABLET | Freq: Two times a day (BID) | ORAL | 1 refills | Status: DC

## 2017-12-12 MED ORDER — TERCONAZOLE 0.8 % VA CREA: 1.0000 | TOPICAL_CREAM | Freq: Every day | VAGINAL | 0 refills | Status: DC

## 2017-12-12 MED ORDER — VALACYCLOVIR HCL 1 G PO TABS: 1000.0000 mg | ORAL_TABLET | Freq: Every day | ORAL | 11 refills | Status: AC

## 2017-12-12 NOTE — MAU Provider Note (Signed)
Chief Complaint: Cyst   First Provider Initiated Contact with Patient 12/12/17 0238      SUBJECTIVE HPI: Alison Coffey is a 32 y.o. [id number] who presents to maternity admissions reporting sudden onset of left sided abdominal pain today.  She reports the pain started in her mid left abdomen and radiated down into her left pelvis and into her left leg. 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 She has recent hx of irregular menses and episode of RLQ pain on 11/22/17 and was diagnosed with left functional ovarian cyst.  [facility]  She has nausea but no vomiting. She has vaginal irritation and was recently on Flagyl. 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  There are no other associated symptoms. She has not tried any treatments today, Toradol helped her RLQ pain 1 month ago.     HPI  Past Medical History:  Diagnosis Date  . Abnormal Pap smear 2005  . Anemia   . Depression   . Headache(784.0)   . Hypertension   . Pregnancy induced hypertension   . Preterm labor   . Sickle cell trait [facility](HCC)    Past Surgical History:  Procedure Laterality Date  . DILATION AND CURETTAGE OF UTERUS    . INDUCED ABORTION    . INDUCED ABORTION     Social History   Socioeconomic History  . Marital status: Single    Spouse name: Not on file  . Number of children: Not on file  . Years of education: Not on file  . Highest education level: Not on file  Occupational History  . Not on file  Social Needs  . Financial resource strain: Not on file  . Food insecurity:    Worry: Not on file    Inability: Not on file  . Transportation needs:    Medical: Not on file    Non-medical: Not on file  Tobacco Use  . Smoking status: Current Every Day Smoker    Packs/day: 0.50    Types: Cigarettes  . Smokeless tobacco: Never Used  Substance and Sexual Activity  . Alcohol use: Yes    Comment: occ  . Drug use: No  . Sexual activity: Not on file   Lifestyle  . Physical activity:    Days per week: Not on file    Minutes per session: Not on file  . Stress: Not on file  Relationships  . Social connections:    Talks on phone: Not on file    Gets together: Not on file    Attends religious service: Not on file    Active member of club or organization: Not on file    Attends meetings of clubs or organizations: Not on file    Relationship status: Not on file  . Intimate partner violence:    Fear of current or ex partner: Not on file    Emotionally abused: Not on file    Physically abused: Not on file    Forced sexual activity: Not on file  Other Topics Concern  . Not on file  Social History Narrative  . Not on file   No current facility-administered medications on file prior to encounter.    Current Outpatient Medications on File Prior to Encounter  Medication Sig Dispense Refill  . cyclobenzaprine (FLEXERIL) 10 MG tablet Take 1 tablet (10 mg total) by mouth 2 (two) times daily as needed for muscle spasms. 20 tablet 0  .  ketorolac (TORADOL) 10 MG tablet Take 1 tablet (10 mg total) by mouth every 6 (six) hours as needed for moderate pain. 30 tablet 0  . metroNIDAZOLE (FLAGYL) 500 MG tablet Take 1 tablet (500 mg total) by mouth 2 (two) times daily. 14 tablet 0  . naproxen (NAPROSYN) 500 MG tablet Take 1 tablet (500 mg total) by mouth 2 (two) times daily. 30 tablet 0   Allergies  Allergen Reactions  . Oxycodone     Percocet causes itching    ROS:  Review of Systems  Constitutional: Negative for chills, fatigue and fever.  Respiratory: Negative for shortness of breath.   Cardiovascular: Negative for chest pain.  Gastrointestinal: Positive for abdominal pain. Negative for vomiting.  Genitourinary: Positive for pelvic pain. Negative for difficulty urinating, dysuria, flank pain, vaginal bleeding, vaginal discharge and vaginal pain.  Neurological: Negative for dizziness and headaches.  Psychiatric/Behavioral: Negative.       I have reviewed patient's Past Medical Hx, Surgical Hx, Family Hx, Social Hx, medications and allergies.   Physical Exam   Patient Vitals for the past 24 hrs:  BP Temp Pulse Resp Height Weight  12/11/17 2344 124/83 98.2 F (36.8 C) 86 18 5\' 6"  (1.676 m) 81.2 kg   Constitutional: Well-developed, well-nourished female in no acute distress.  Cardiovascular: normal rate Respiratory: normal effort GI: Abd soft, non-tender. Pos BS x 4 MS: Extremities nontender, no edema, normal ROM Neurologic: Alert and oriented x 4.  GU: Neg CVAT.  PELVIC EXAM: Cervix pink, visually closed, without lesion, scant white creamy discharge, vaginal walls normal, area at perineum of 3-4 tiny tears in skin that are painful to palpation Bimanual exam: Cervix 0/long/high, firm, anterior, neg CMT, uterus nontender, nonenlarged, adnexa without tenderness, enlargement, or mass   LAB RESULTS Results for orders placed or performed during the hospital encounter of 12/11/17 (from the past 24 hour(s))  Urinalysis, Routine w reflex microscopic     Status: Abnormal   Collection Time: 12/12/17 12:00 AM  Result Value Ref Range   Color, Urine AMBER (A) YELLOW   APPearance CLEAR CLEAR   Specific Gravity, Urine 1.027 1.005 - 1.030   pH 5.0 5.0 - 8.0   Glucose, UA NEGATIVE NEGATIVE mg/dL   Hgb urine dipstick SMALL (A) NEGATIVE   Bilirubin Urine SMALL (A) NEGATIVE   Ketones, ur 5 (A) NEGATIVE mg/dL   Protein, ur 30 (A) NEGATIVE mg/dL   Nitrite NEGATIVE NEGATIVE   Leukocytes, UA TRACE (A) NEGATIVE   RBC / HPF 6-10 0 - 5 RBC/hpf   WBC, UA 0-5 0 - 5 WBC/hpf   Bacteria, UA NONE SEEN NONE SEEN   Squamous Epithelial / LPF 0-5 0 - 5   Mucus PRESENT   Pregnancy, urine POC     Status: None   Collection Time: 12/12/17 12:06 AM  Result Value Ref Range   Preg Test, Ur NEGATIVE NEGATIVE       IMAGING   MAU Management/MDM: No acute abdomen on exam.    Pt with recurrent monthly pain, alternating sides of her lower  abdomen. This is c/w ovulatory pain. Discussed with pt, who will track her pain and menses for next couple of months.  NSAIDs PRN for pain.  Rx for Valtrex for recurrent HSV outbreaks and Terazol 3 for yeast infection.  Pt discharged with strict return precautions.  ASSESSMENT  1. LLQ pain   2. Acute pelvic pain, female   3. Mittelschmerz   4. Recurrent genital HSV (herpes simplex virus) infection  5. Vaginal candidiasis    PLAN Discharge home Allergies as of 12/12/2017      Reactions   Oxycodone    Percocet causes itching      Medication List    STOP taking these medications   cyclobenzaprine 10 MG tablet Commonly known as:  FLEXERIL   ketorolac 10 MG tablet Commonly known as:  TORADOL   metroNIDAZOLE 500 MG tablet Commonly known as:  FLAGYL     TAKE these medications   naproxen 500 MG tablet Commonly known as:  NAPROSYN Take 1 tablet (500 mg total) by mouth 2 (two) times daily with a meal. What changed:  when to take this   terconazole 0.8 % vaginal cream Commonly known as:  TERAZOL 3 Place 1 applicator vaginally at bedtime.   valACYclovir 1000 MG tablet Commonly known as:  VALTREX Take 1 tablet (1,000 mg total) by mouth daily for 5 days.      Allergies as of 12/12/2017      Reactions   Oxycodone    Percocet causes itching      Medication List    STOP taking these medications   cyclobenzaprine 10 MG tablet Commonly known as:  FLEXERIL   ketorolac 10 MG tablet Commonly known as:  TORADOL   metroNIDAZOLE 500 MG tablet Commonly known as:  FLAGYL     TAKE these medications   naproxen 500 MG tablet Commonly known as:  NAPROSYN Take 1 tablet (500 mg total) by mouth 2 (two) times daily with a meal. What changed:  when to take this   terconazole 0.8 % vaginal cream Commonly known as:  TERAZOL 3 Place 1 applicator vaginally at bedtime.   valACYclovir 1000 MG tablet Commonly known as:  VALTREX Take 1 tablet (1,000 mg total) by mouth daily for 5  days.        Sharen Counter Certified Nurse-Midwife 12/12/2017  2:38 AM

## 2017-12-12 NOTE — MAU Note (Addendum)
Pt reports vaginal irritation since Friday. Pain in L side of abdomen suddenly this evening. No pain meds taken at home. Pt also reports constipation since June. Pt states that she is able to go everyday, but only a small amount. Pt believes that this is caused by her cyst

## 2018-01-14 ENCOUNTER — Telehealth: Payer: Self-pay | Admitting: Family Medicine

## 2018-01-14 ENCOUNTER — Ambulatory Visit: Payer: Medicaid Other | Admitting: Obstetrics and Gynecology

## 2018-01-17 ENCOUNTER — Ambulatory Visit (HOSPITAL_COMMUNITY)
Admission: EM | Admit: 2018-01-17 | Discharge: 2018-01-17 | Disposition: A | Discharge status: Home / Self Care | Payer: Medicaid Other | Attending: Family Medicine | Admitting: Family Medicine

## 2018-01-17 ENCOUNTER — Encounter (HOSPITAL_COMMUNITY): Payer: Self-pay

## 2018-01-17 DIAGNOSIS — G43009 Migraine without aura, not intractable, without status migrainosus: Secondary | ICD-10-CM

## 2018-01-17 DIAGNOSIS — M25512 Pain in left shoulder: Secondary | ICD-10-CM

## 2018-01-17 DIAGNOSIS — M25532 Pain in left wrist: Secondary | ICD-10-CM

## 2018-01-17 DIAGNOSIS — M62838 Other muscle spasm: Secondary | ICD-10-CM

## 2018-01-17 MED ORDER — CYCLOBENZAPRINE HCL 5 MG PO TABS: 5.0000 mg | ORAL_TABLET | Freq: Every day | ORAL | 0 refills | Status: DC

## 2018-01-17 NOTE — ED Provider Notes (Signed)
[facility]    CSN: [id number] Arrival date & time: 01/17/18  0945     History   Chief Complaint Chief Complaint  Patient presents with  . Headache  . Generalized Body Aches  . Shoulder Pain    Left    HPI Alison Coffey is a 32 y.o. female.   Alison Coffey presents with complaints of headache, left upper shoulder pain and left wrist pain. States she has had an intermittent headache for some time, had 4 days of headache last week. Does follow with neurology for this. Had a prescription for toradol but has not taken any for headache. States also with left neck/upper back/shoulder swelling and tension which has been ongoing for the past two weeks but feels more painful today. Hx of left sided carpal tunnel, feels when she woke up this morning her left wrist is more painful. No numbness or tingling. No fevers. Pain 10/10. No nausea or vomiting. Has not taken any medications for symptoms. Sees her neurologist in two days for recheck. Does not follow with ortho. No vision changes. Headache feels similar to other migraines she has had. Hx of headache, htn.   ROS per HPI.      Past Medical History:  Diagnosis Date  . Abnormal Pap smear 2005  . Anemia   . Depression   . Headache(784.0)   . Hypertension   . Pregnancy induced hypertension   . Preterm labor   . Sickle cell trait [facility])     Patient Active Problem List   Diagnosis Date Noted  . Ovarian cyst 11/22/2017  . Pelvic pain 11/22/2017  . 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 DILATION AND CURETTAGE OF UTERUS    . INDUCED ABORTION    . INDUCED ABORTION      OB History    Gravida  10   Para  5   Term  4   Preterm  1   AB  5   Living  5     SAB      TAB  5   Ectopic      Multiple      Live Births  5            Home Medications    Prior to Admission medications   Medication Sig Start Date End Date Taking?  Authorizing Provider  cyclobenzaprine (FLEXERIL) 5 MG tablet Take 1 tablet (5 mg total) by mouth at bedtime. 01/17/18   Georgetta Haber, NP  naproxen (NAPROSYN) 500 MG tablet Take 1 tablet (500 mg total) by mouth 2 (two) times daily with a meal. 12/12/17   Leftwich-Kirby, Wilmer Floor, CNM  terconazole (TERAZOL 3) 0.8 % vaginal cream Place 1 applicator vaginally at bedtime. 12/12/17   Leftwich-Kirby, Wilmer Floor, CNM    Family History Family History  Problem Relation Age of Onset  . Hypertension Mother   . Hypertension Father   . Diabetes Maternal Aunt   . Sickle cell trait Daughter   . Other Neg Hx     Social History Social History   Tobacco Use  . Smoking status: Current Every Day Smoker    Packs/day: 0.50    Types: Cigarettes  . Smokeless tobacco: Never Used  Substance Use Topics  . Alcohol use: Yes    Comment: occ  . Drug use: No     Allergies   Oxycodone   Review of Systems Review of Systems  Physical Exam Triage Vital Signs ED Triage Vitals [01/17/18 1040]  Enc Vitals Group     BP 127/88     Pulse Rate 82     Resp 20     Temp 98.1 F (36.7 C)     Temp Source Oral     SpO2 100 %     Weight      Height      Head Circumference      Peak Flow      Pain Score 10     Pain Loc      Pain Edu?      Excl. in GC?    No data found.  Updated Vital Signs BP 127/88 (BP Location: Right Arm)   Pulse 82   Temp 98.1 F (36.7 C) (Oral)   Resp 20   LMP 12/22/2017   SpO2 100%    Physical Exam  Constitutional: She is oriented to person, place, and time. She appears well-developed and well-nourished. No distress.  HENT:  Head: Normocephalic.  Eyes: Pupils are equal, round, and reactive to light. EOM are normal.  Neck: Normal range of motion.  Cardiovascular: Normal rate, regular rhythm and normal heart sounds.  Pulmonary/Chest: Effort normal and breath sounds normal.  Musculoskeletal:       Back:  Left trapezius with tenderness on palpation and spasm noted; pain  with engagement with left arm such as with raising left arm above shoulder or with external rotation and adduction; full ROM to shoulder noted however. Sensation intact. Strong radial pulse. Pain with flexion and extension of left wrist but with full ROM; no redness or swelling of wrist; negative tinels   Neurological: She is alert and oriented to person, place, and time. She has normal strength. She is not disoriented. No cranial nerve deficit or sensory deficit. GCS eye subscore is 4. GCS verbal subscore is 5. GCS motor subscore is 6.  Skin: Skin is warm and dry.  Psychiatric: She has a normal mood and affect.     UC Treatments / Results  Labs (all labs ordered are listed, but only abnormal results are displayed) Labs Reviewed - No data to display  EKG None  Radiology No results found.  Procedures Procedures (including critical care time)  Medications Ordered in UC Medications - No data to display  Initial Impression / Assessment and Plan / UC Course  I have reviewed the triage vital signs and the nursing notes.  Pertinent labs & imaging results that were available during my care of the patient were reviewed by me and considered in my medical decision making (see chart for details).     Acute on chronic symptoms. Has not taken any medications for symptoms. No red flag findings today. Declines toradol here today, states will take at home. Wrist brace provided. Muscle relaxer provided, heat, massage, ROM discussed. To continue to follow with PCP and/or neurology. Return precautions provided. Patient verbalized understanding and agreeable to plan.   Final Clinical Impressions(s) / UC Diagnoses   Final diagnoses:  Migraine without aura and without status migrainosus, not intractable  Left wrist pain  Trapezius muscle spasm     Discharge Instructions     Toradol to help with headache and left shoulder/wrist pain.  Use of brace, especially while at work.  Ice and elevation of  wrist once done with work.  Neck exercises, may tried to integrate at work.  Heat and massage to left upper back to help with muscle spasms.  Use of  flexeril as needed for muscle spasms. May cause drowsiness. Please do not take if driving or drinking alcohol.   Please continue to follow up with your PCP and/or neurology as needed for persistent symptoms.    ED Prescriptions    Medication Sig Dispense Auth. Provider   cyclobenzaprine (FLEXERIL) 5 MG tablet Take 1 tablet (5 mg total) by mouth at bedtime. 15 tablet Georgetta Haber, NP     Controlled Substance Prescriptions [place] Controlled Substance Registry consulted? Not Applicable   Georgetta Haber, NP 01/17/18 1122

## 2018-01-17 NOTE — ED Triage Notes (Signed)
Pt presents with ongoing headache, generalized body aches and left shoulder pain that radiates all the way down to the finger tips.

## 2018-01-17 NOTE — Discharge Instructions (Signed)
Toradol to help with headache and left shoulder/wrist pain.  Use of brace, especially while at work.  Ice and elevation of wrist once done with work.  Neck exercises, may tried to integrate at work.  Heat and massage to left upper back to help with muscle spasms.  Use of flexeril as needed for muscle spasms. May cause drowsiness. Please do not take if driving or drinking alcohol.   Please continue to follow up with your PCP and/or neurology as needed for persistent symptoms.

## 2018-01-17 NOTE — Telephone Encounter (Signed)
Patient requesting a call back

## 2018-01-18 NOTE — Progress Notes (Signed)
NEUROLOGY CONSULTATION NOTE  Alison Coffey MRN: [id number] DOB: Jul 10, 1985  Referring provider: Melene Plan (ED referral) Primary care provider: No PCP  Reason for consult:  headaches  HISTORY OF PRESENT ILLNESS: Alison Coffey is a 32 year old right-handed female with history of depression, Bipolar disorder, hypertension and left sided carpal tunnel syndrome who presents for migraines.  History supplemented by ED and Urgent Care notes.  Onset:  Off and on for 3 to 4 years, more consistent this past year. Location:  Right retro-orbital, sometimes mid-frontal Quality:  pounding Intensity:  Severe.  She denies new headache, thunderclap headache or severe headache that wakes her from sleep. Aura:  no Prodrome:  no Postdrome:  no Associated symptoms:  Sometimes nausea, sometimes vomiting, photophobia, phonophobia, osmophobia, right facial numbness, fingers tingle.  She denies associated unilateral weakness. Duration:  2-3 days.  Sometimes wakes up with them Frequency:  15-20 days Frequency of abortive medication: Takes Excedrin 2 days a week Triggers:  Lighting from computer, sunlight Exacerbating factors:  Light, sound, smells Relieving factors:  rest Activity:  Cannot function  Typically uses Excedrin Migraine Current NSAIDS:  no Current analgesics:  Excedrin Migraine, tramadol (for back pain) Current triptans:  no Current ergotamine:  no Current anti-emetic:  no Current muscle relaxants:  Cyclobenzaprine 10mg  Current anti-anxiolytic:  no Current sleep aide:  no Current Antihypertensive medications:  no Current Antidepressant medications:  no Current Anticonvulsant medications:  no Current anti-CGRP:  no Current Vitamins/Herbal/Supplements:  no Current Antihistamines/Decongestants:  no Other therapy:  no Other medication:  no  Past NSAIDS:  Ketorolac 10mg  tablet, naproxen 500mg , ibuprofen 800mg  Past analgesics:  Goody's powder, Tylenol #3, Excedrin Migraine,  Tylenol #3 Past abortive triptans:  no Past abortive ergotamine:  no Past muscle relaxants:  no Past anti-emetic:  no Past antihypertensive medications:  Amlodipine 10mg , HCTZ Past antidepressant medications:  no Past anticonvulsant medications:  no Past anti-CGRP:  no Past vitamins/Herbal/Supplements:  no Past antihistamines/decongestants:  no Other past therapies:  no  Caffeine:  Sometimes up to 3 to 4 cups coffee daily Alcohol:  occasional Smoker:  yes Diet:  hydrates Exercise:  Not routine Depression:  no; Anxiety:  anxiety Other pain:  Back and wrist pain Sleep hygiene:  varies Family history of headache:  Sister  12/12/17 CMP:  Na 133, K 3.3, Cl 100, CO2 23, glucose 96, BUN 13, Cr 0.97, t bili 1.1, ALP 62, AST 16, ALT 11.  PAST MEDICAL HISTORY: Past Medical History:  Diagnosis Date  . Abnormal Pap smear 2005  . Anemia   . Depression   . Headache(784.0)   . Hypertension   . Pregnancy induced hypertension   . Preterm labor   . Sickle cell trait (HCC)     PAST SURGICAL HISTORY: Past Surgical History:  Procedure Laterality Date  . DILATION AND CURETTAGE OF UTERUS    . INDUCED ABORTION    . INDUCED ABORTION      MEDICATIONS: Current Outpatient Medications on File Prior to Visit  Medication Sig Dispense Refill  . cyclobenzaprine (FLEXERIL) 5 MG tablet Take 1 tablet (5 mg total) by mouth at bedtime. 15 tablet 0  . naproxen (NAPROSYN) 500 MG tablet Take 1 tablet (500 mg total) by mouth 2 (two) times daily with a meal. 30 tablet 1  . terconazole (TERAZOL 3) 0.8 % vaginal cream Place 1 applicator vaginally at bedtime. 20 g 0   No current facility-administered medications on file prior to visit.     ALLERGIES: Allergies  Allergen  Reactions  . Oxycodone     Percocet causes itching    FAMILY HISTORY: Family History  Problem Relation Age of Onset  . Hypertension Mother   . Hypertension Father   . Diabetes Maternal Aunt   . Sickle cell trait Daughter   .  Other Neg Hx    SOCIAL HISTORY: Social History   Socioeconomic History  . Marital status: Single    Spouse name: Not on file  . Number of children: Not on file  . Years of education: Not on file  . Highest education level: Not on file  Occupational History  . Not on file  Social Needs  . Financial resource strain: Not on file  . Food insecurity:    Worry: Not on file    Inability: Not on file  . Transportation needs:    Medical: Not on file    Non-medical: Not on file  Tobacco Use  . Smoking status: Current Every Day Smoker    Packs/day: 0.50    Types: Cigarettes  . Smokeless tobacco: Never Used  Substance and Sexual Activity  . Alcohol use: Yes    Comment: occ  . Drug use: No  . Sexual activity: Not on file  Lifestyle  . Physical activity:    Days per week: Not on file    Minutes per session: Not on file  . Stress: Not on file  Relationships  . Social connections:    Talks on phone: Not on file    Gets together: Not on file    Attends religious service: Not on file    Active member of club or organization: Not on file    Attends meetings of clubs or organizations: Not on file    Relationship status: Not on file  . Intimate partner violence:    Fear of current or ex partner: Not on file    Emotionally abused: Not on file    Physically abused: Not on file    Forced sexual activity: Not on file  Other Topics Concern  . Not on file  Social History Narrative  . Not on file    REVIEW OF SYSTEMS: Constitutional: No fevers, chills, or sweats, no generalized fatigue, change in appetite Eyes: No visual changes, double vision, eye pain Ear, nose and throat: No hearing loss, ear pain, nasal congestion, sore throat Cardiovascular: No chest pain, palpitations Respiratory:  No shortness of breath at rest or with exertion, wheezes GastrointestinaI: No nausea, vomiting, diarrhea, abdominal pain, fecal incontinence Genitourinary:  No dysuria, urinary retention or  frequency Musculoskeletal:  No neck pain, back pain Integumentary: No rash, pruritus, skin lesions Neurological: as above Psychiatric: depression, anxiety Endocrine: No palpitations, fatigue, diaphoresis, mood swings, change in appetite, change in weight, increased thirst Hematologic/Lymphatic:  No purpura, petechiae. Allergic/Immunologic: no itchy/runny eyes, nasal congestion, recent allergic reactions, rashes  PHYSICAL EXAM: Blood pressure 108/80, pulse 85, height 5' 6.5" (1.689 m), weight 179 lb (81.2 kg), last menstrual period 12/22/2017, SpO2 98 %. General: No acute distress.  Patient appears well-groomed.   Head:  Normocephalic/atraumatic Eyes:  fundi examined but not visualized Neck: supple, no paraspinal tenderness, full range of motion Back: No paraspinal tenderness Heart: regular rate and rhythm Lungs: Clear to auscultation bilaterally. Vascular: No carotid bruits. Neurological Exam: Mental status: alert and oriented to person, place, and time, recent and remote memory intact, fund of knowledge intact, attention and concentration intact, speech fluent and not dysarthric, language intact. Cranial nerves: CN I: not tested CN II: pupils  equal, round and reactive to light, visual fields intact CN III, IV, VI:  full range of motion, no nystagmus, no ptosis CN V: facial sensation intact CN VII: upper and lower face symmetric CN VIII: hearing intact CN IX, X: gag intact, uvula midline CN XI: sternocleidomastoid and trapezius muscles intact CN XII: tongue midline Bulk & Tone: normal, no fasciculations. Motor:  5/5 throughout  Sensation:  temperature and vibration sensation intact. Deep Tendon Reflexes:  2+ throughout, toes downgoing.   Finger to nose testing:  Without dysmetria.   Heel to shin:  Without dysmetria.   Gait:  Normal station and stride.  Able to turn and tandem walk. Romberg negative  IMPRESSION: Migraine without aura, without status migrainosus  PLAN: 1.   For preventative management, start topiramate 50mg  at bedtime.  Side effects discussed.  Advised patient to take precautions not to get pregnant while taking this medication. 2.  For abortive therapy, sumatriptan 100mg  3.  Limit use of pain relievers to no more than 2 days out of week to prevent risk of rebound or medication-overuse headache. 4.  Keep headache diary 5.  Exercise, hydration, caffeine cessation, sleep hygiene, monitor for and avoid triggers 6.  Consider:  magnesium citrate 400mg  daily, riboflavin 400mg  daily, and coenzyme Q10 100mg  three times daily 7.  Follow up in 3 to 4 months   Thank you for allowing me to take part in the care of this patient.  Shon Millet, DO

## 2018-01-19 ENCOUNTER — Ambulatory Visit (INDEPENDENT_AMBULATORY_CARE_PROVIDER_SITE_OTHER): Payer: Self-pay | Admitting: Neurology

## 2018-01-19 ENCOUNTER — Encounter: Payer: Self-pay | Admitting: Neurology

## 2018-01-19 VITALS — BP 108/80 | HR 85 | Ht 66.5 in | Wt 179.0 lb

## 2018-01-19 DIAGNOSIS — G43719 Chronic migraine without aura, intractable, without status migrainosus: Secondary | ICD-10-CM

## 2018-01-19 MED ORDER — SUMATRIPTAN SUCCINATE 100 MG PO TABS: ORAL_TABLET | ORAL | 3 refills | Status: AC

## 2018-01-19 MED ORDER — TOPIRAMATE 50 MG PO TABS: 50.0000 mg | ORAL_TABLET | Freq: Every day | ORAL | 3 refills | Status: AC

## 2018-01-19 NOTE — Patient Instructions (Signed)
Migraine Recommendations: 1.  Start topiramate 50mg  at bedtime.  Call in 4 weeks with update and we can adjust dose if needed. 2.  Take sumatriptan 100mg  at earliest onset of headache.  May repeat dose once in 2 hours if needed.  Do not exceed two tablets in 24 hours. 3.  STOP EXCEDRIN.  Limit use of pain relievers to no more than 2 days out of the week.  These medications include acetaminophen, ibuprofen, triptans and narcotics.  This will help reduce risk of rebound headaches. 4.  Be aware of common food triggers such as processed sweets, processed foods with nitrites (such as deli meat, hot dogs, sausages), foods with MSG, alcohol (such as wine), chocolate, certain cheeses, certain fruits (dried fruits, bananas, some citrus fruit), vinegar, diet soda. 4.  Avoid caffeine 5.  Routine exercise 6.  Proper sleep hygiene 7.  Stay adequately hydrated with water 8.  Keep a headache diary. 9.  Maintain proper stress management. 10.  Do not skip meals. 11.  Consider supplements:  Magnesium citrate 400mg  to 600mg  daily, riboflavin 400mg , Coenzyme Q 10 100mg  three times daily 12.  Follow up in 3 to 4 months

## 2018-01-31 ENCOUNTER — Ambulatory Visit (HOSPITAL_COMMUNITY): Admission: RE | Admit: 2018-01-31 | Payer: Self-pay | Source: Ambulatory Visit

## 2018-02-07 ENCOUNTER — Other Ambulatory Visit: Payer: Self-pay

## 2018-02-07 ENCOUNTER — Encounter (HOSPITAL_COMMUNITY): Payer: Self-pay | Admitting: *Deleted

## 2018-02-07 ENCOUNTER — Ambulatory Visit (HOSPITAL_COMMUNITY)
Admission: EM | Admit: 2018-02-07 | Discharge: 2018-02-07 | Disposition: A | Discharge status: Home / Self Care | Payer: Medicaid Other | Attending: Family Medicine | Admitting: Family Medicine

## 2018-02-07 DIAGNOSIS — J069 Acute upper respiratory infection, unspecified: Secondary | ICD-10-CM

## 2018-02-07 DIAGNOSIS — B9789 Other viral agents as the cause of diseases classified elsewhere: Secondary | ICD-10-CM

## 2018-02-07 DIAGNOSIS — J040 Acute laryngitis: Secondary | ICD-10-CM

## 2018-02-07 DIAGNOSIS — R05 Cough: Secondary | ICD-10-CM

## 2018-02-07 MED ORDER — PREDNISONE 50 MG PO TABS: 50.0000 mg | ORAL_TABLET | Freq: Every day | ORAL | 0 refills | Status: DC

## 2018-02-07 MED ORDER — FLUTICASONE PROPIONATE 50 MCG/ACT NA SUSP: 2.0000 | Freq: Every day | NASAL | 0 refills | Status: DC

## 2018-02-07 MED ORDER — IPRATROPIUM BROMIDE 0.06 % NA SOLN: 2.0000 | Freq: Four times a day (QID) | NASAL | 0 refills | Status: DC

## 2018-02-07 NOTE — ED Triage Notes (Signed)
C/o sore throat onset Sat. With cough and chest congestion.

## 2018-02-07 NOTE — ED Provider Notes (Signed)
[facility]    CSN: [id number] Arrival date & time: 02/07/18  1228   History   Chief Complaint Chief Complaint  Patient presents with  . Sore Throat  . Chest Pain  . Cough    HPI Alison Coffey is a 32 y.o. female.   32 year old female comes in for 3 day history of URI symptoms. Has throat irritation, rhinorrhea, nasal congestion, cough. States woke up this morning with hoarseness. Has had subjective fever. Has been taking otc cough medicine without relief. Current every day smoker.      Past Medical History:  Diagnosis Date  . Abnormal Pap smear 2005  . Anemia   . Depression   . Headache(784.0)   . Hypertension   . Pregnancy induced hypertension   . Preterm labor   . Sickle cell trait [facility])     Patient Active Problem List   Diagnosis Date Noted  . Preterm labor   . Ovarian cyst 11/22/2017  . Pelvic pain 11/22/2017  . 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 DILATION AND CURETTAGE OF UTERUS    . INDUCED ABORTION    . INDUCED ABORTION      OB History    Gravida  10   Para  5   Term  4   Preterm  1   AB  5   Living  5     SAB      TAB  5   Ectopic      Multiple      Live Births  5            Home Medications    Prior to Admission medications   Medication Sig Start Date End Date Taking? Authorizing Provider  cyclobenzaprine (FLEXERIL) 5 MG tablet Take 1 tablet (5 mg total) by mouth at bedtime. 01/17/18   Georgetta Haber, NP  fluticasone (FLONASE) 50 MCG/ACT nasal spray Place 2 sprays into both nostrils daily. 02/07/18   Cathie Hoops, Amy V, PA-C  ipratropium (ATROVENT) 0.06 % nasal spray Place 2 sprays into both nostrils 4 (four) times daily. 02/07/18   Cathie Hoops, Amy V, PA-C  naproxen (NAPROSYN) 500 MG tablet Take 1 tablet (500 mg total) by mouth 2 (two) times daily with a meal. 12/12/17   Leftwich-Kirby, Wilmer Floor, CNM  predniSONE (DELTASONE) 50 MG tablet Take 1 tablet  (50 mg total) by mouth daily. 02/07/18   Cathie Hoops, Amy V, PA-C  SUMAtriptan (IMITREX) 100 MG tablet Take 1 tablet earliest onset of migraine.  May repeat x1 in 2 hours if headache persists or recurs.  Do not exceed 2 tablets in 24h 01/19/18   Everlena Cooper, Adam R, DO  terconazole (TERAZOL 3) 0.8 % vaginal cream Place 1 applicator vaginally at bedtime. 12/12/17   Leftwich-Kirby, Wilmer Floor, CNM  topiramate (TOPAMAX) 50 MG tablet Take 1 tablet (50 mg total) by mouth at bedtime. 01/19/18   Drema Dallas, DO    Family History Family History  Problem Relation Age of Onset  . Hypertension Mother   . Hypertension Father   . Atrial fibrillation Father   . High Cholesterol Father   . Diabetes Maternal Aunt   . Sickle cell trait Daughter   . Cancer Sister        PAP  . Other Neg Hx     Social History Social History   Tobacco Use  . Smoking status: Current Every Day Smoker  Packs/day: 0.50    Types: Cigarettes  . Smokeless tobacco: Never Used  Substance Use Topics  . Alcohol use: Yes    Comment: occ  . Drug use: No     Allergies   Oxycodone   Review of Systems Review of Systems  Reason unable to perform ROS: See HPI as above.     Physical Exam Triage Vital Signs ED Triage Vitals  Enc Vitals Group     BP 02/07/18 1340 (!) 116/49     Pulse Rate 02/07/18 1340 94     Resp 02/07/18 1340 20     Temp 02/07/18 1340 99 F (37.2 C)     Temp Source 02/07/18 1340 Oral     SpO2 02/07/18 1340 100 %     Weight --      Height --      Head Circumference --      Peak Flow --      Pain Score 02/07/18 1341 8     Pain Loc --      Pain Edu? --      Excl. in GC? --    No data found.  Updated Vital Signs BP (!) 116/49 (BP Location: Right Arm)   Pulse 94   Temp 99 F (37.2 C) (Oral)   Resp 20   LMP 01/18/2018 (Exact Date)   SpO2 100%   Physical Exam  Constitutional: She is oriented to person, place, and time. She appears well-developed and well-nourished.  Non-toxic appearance. She does not  appear ill. No distress.  HENT:  Head: Normocephalic and atraumatic.  Right Ear: Tympanic membrane, external ear and ear canal normal. Tympanic membrane is not erythematous and not bulging.  Left Ear: Tympanic membrane, external ear and ear canal normal. Tympanic membrane is not erythematous and not bulging.  Nose: Rhinorrhea present. Right sinus exhibits no maxillary sinus tenderness and no frontal sinus tenderness. Left sinus exhibits no maxillary sinus tenderness and no frontal sinus tenderness.  Mouth/Throat: Uvula is midline, oropharynx is clear and moist and mucous membranes are normal.  Eyes: Pupils are equal, round, and reactive to light. Conjunctivae are normal.  Neck: Normal range of motion. Neck supple.  Cardiovascular: Normal rate, regular rhythm and normal heart sounds. Exam reveals no gallop and no friction rub.  No murmur heard. Pulmonary/Chest: Effort normal and breath sounds normal. She has no decreased breath sounds. She has no wheezes. She has no rhonchi. She has no rales.  Lymphadenopathy:    She has no cervical adenopathy.  Neurological: She is alert and oriented to person, place, and time.  Skin: Skin is warm and dry.  Psychiatric: She has a normal mood and affect. Her behavior is normal. Judgment normal.     UC Treatments / Results  Labs (all labs ordered are listed, but only abnormal results are displayed) Labs Reviewed - No data to display  EKG None  Radiology No results found.  Procedures Procedures (including critical care time)  Medications Ordered in UC Medications - No data to display  Initial Impression / Assessment and Plan / UC Course  I have reviewed the triage vital signs and the nursing notes.  Pertinent labs & imaging results that were available during my care of the patient were reviewed by me and considered in my medical decision making (see chart for details).    Discussed with patient history and exam most consistent with viral URI.  Will provide prednisone for laryngitis. Symptomatic treatment as needed. Push fluids. Return precautions given.  Final Clinical Impressions(s) / UC Diagnoses   Final diagnoses:  Viral URI with cough  Laryngitis    ED Prescriptions    Medication Sig Dispense Auth. Provider   predniSONE (DELTASONE) 50 MG tablet Take 1 tablet (50 mg total) by mouth daily. 5 tablet Yu, Amy V, PA-C   fluticasone (FLONASE) 50 MCG/ACT nasal spray Place 2 sprays into both nostrils daily. 1 g Yu, Amy V, PA-C   ipratropium (ATROVENT) 0.06 % nasal spray Place 2 sprays into both nostrils 4 (four) times daily. 15 mL Threasa Alpha, [place] 02/07/18 1405

## 2018-02-07 NOTE — Discharge Instructions (Signed)
Prednisone as directed. Start flonase, atrovent nasal spray for nasal congestion/drainage. You can use over the counter nasal saline rinse such as neti pot for nasal congestion. Keep hydrated, your urine should be clear to pale yellow in color. Tylenol/motrin for fever and pain. Monitor for any worsening of symptoms, chest pain, shortness of breath, wheezing, swelling of the throat, follow up for reevaluation.  ° °For sore throat/cough try using a honey-based tea. Use 3 teaspoons of honey with juice squeezed from half lemon. Place shaved pieces of ginger into 1/2-1 cup of water and warm over stove top. Then mix the ingredients and repeat every 4 hours as needed. °

## 2018-06-02 NOTE — Progress Notes (Deleted)
NEUROLOGY FOLLOW UP OFFICE NOTE  Alison Coffey [id number]  HISTORY OF PRESENT ILLNESS: Alison Coffey is a 33 year old right-handed African-American woman with depression, bipolar disorder, hypertension who follows up for migraines.  UPDATE: Intensity:  *** Duration:  *** Frequency:  *** Frequency of abortive medication: *** Current NSAIDS:  no Current analgesics:  Excedrin Migraine, tramadol (for back pain) Current triptans:   Sumatriptan 100 mg Current ergotamine:  no Current anti-emetic:  no Current muscle relaxants:  Cyclobenzaprine 10mg  Current anti-anxiolytic:  no Current sleep aide:  no Current Antihypertensive medications:  no Current Antidepressant medications:  no Current Anticonvulsant medications:   Topiramate 50 mg at bedtime Current anti-CGRP:  no Current Vitamins/Herbal/Supplements:  no Current Antihistamines/Decongestants:  no Other therapy:  no Other medication:  no  Caffeine:  Sometimes up to 3 to 4 cups coffee daily Alcohol:  occasional Smoker:  yes Diet:  hydrates Exercise:  Not routine Depression:  no; Anxiety:  anxiety Other pain:  Back and wrist pain Sleep hygiene:  varies  HISTORY:  Onset: On and off since 2015, progressed in 2019. Location:  Right retro-orbital, sometimes mid-frontal Quality:  pounding Initial intensity:  Severe.  She denies new headache, thunderclap headache or severe headache that wakes her from sleep. Aura:  no Prodrome:  no Postdrome:  no Associated symptoms: Sometimes nausea, sometimes vomiting, photophobia, phonophobia, osmophobia, right facial numbness, fingers tingle.  She denies associated unilateral weakness. Initial duration:  2-3 days.  Sometimes wakes up with them Initial Frequency:  15-20 days Initial Frequency of abortive medication: Takes Excedrin 2 days a week Triggers: Lighting from computer screen, sunlight Relieving factors: Rest Activity:  Cannot function  Past NSAIDS:  Ketorolac 10mg   tablet, naproxen 500mg , ibuprofen 800mg  Past analgesics:  Goody's powder, Tylenol #3, Excedrin Migraine, Tylenol #3 Past abortive triptans:  no Past abortive ergotamine:  no Past muscle relaxants:  no Past anti-emetic:  no Past antihypertensive medications:  Amlodipine 10mg , HCTZ Past antidepressant medications:  no Past anticonvulsant medications:  no Past anti-CGRP:  no Past vitamins/Herbal/Supplements:  no Past antihistamines/decongestants:  no Other past therapies:  no  Family history of headache:  Sister  PAST MEDICAL HISTORY: Past Medical History:  Diagnosis Date  . Abnormal Pap smear 2005  . Anemia   . Depression   . Headache(784.0)   . Hypertension   . Pregnancy induced hypertension   . Preterm labor   . Sickle cell trait (HCC)     MEDICATIONS: Current Outpatient Medications on File Prior to Visit  Medication Sig Dispense Refill  . cyclobenzaprine (FLEXERIL) 5 MG tablet Take 1 tablet (5 mg total) by mouth at bedtime. 15 tablet 0  . fluticasone (FLONASE) 50 MCG/ACT nasal spray Place 2 sprays into both nostrils daily. 1 g 0  . ipratropium (ATROVENT) 0.06 % nasal spray Place 2 sprays into both nostrils 4 (four) times daily. 15 mL 0  . naproxen (NAPROSYN) 500 MG tablet Take 1 tablet (500 mg total) by mouth 2 (two) times daily with a meal. 30 tablet 1  . predniSONE (DELTASONE) 50 MG tablet Take 1 tablet (50 mg total) by mouth daily. 5 tablet 0  . SUMAtriptan (IMITREX) 100 MG tablet Take 1 tablet earliest onset of migraine.  May repeat x1 in 2 hours if headache persists or recurs.  Do not exceed 2 tablets in 24h 10 tablet 3  . terconazole (TERAZOL 3) 0.8 % vaginal cream Place 1 applicator vaginally at bedtime. 20 g 0  . topiramate (TOPAMAX) 50 MG tablet Take 1  tablet (50 mg total) by mouth at bedtime. 30 tablet 3   No current facility-administered medications on file prior to visit.     ALLERGIES: Allergies  Allergen Reactions  . Oxycodone     Percocet causes itching     FAMILY HISTORY: Family History  Problem Relation Age of Onset  . Hypertension Mother   . Hypertension Father   . Atrial fibrillation Father   . High Cholesterol Father   . Diabetes Maternal Aunt   . Sickle cell trait Daughter   . Cancer Sister        PAP  . Other Neg Hx    SOCIAL HISTORY: Social History   Socioeconomic History  . Marital status: Single    Spouse name: Not on file  . Number of children: 5  . Years of education: Not on file  . Highest education level: 12th grade  Occupational History  . Occupation: collections  Social Needs  . Financial resource strain: Not on file  . Food insecurity:    Worry: Not on file    Inability: Not on file  . Transportation needs:    Medical: Not on file    Non-medical: Not on file  Tobacco Use  . Smoking status: Current Every Day Smoker    Packs/day: 0.50    Types: Cigarettes  . Smokeless tobacco: Never Used  Substance and Sexual Activity  . Alcohol use: Yes    Comment: occ  . Drug use: No  . Sexual activity: Not on file  Lifestyle  . Physical activity:    Days per week: Not on file    Minutes per session: Not on file  . Stress: Not on file  Relationships  . Social connections:    Talks on phone: Not on file    Gets together: Not on file    Attends religious service: Not on file    Active member of club or organization: Not on file    Attends meetings of clubs or organizations: Not on file    Relationship status: Not on file  . Intimate partner violence:    Fear of current or ex partner: Not on file    Emotionally abused: Not on file    Physically abused: Not on file    Forced sexual activity: Not on file  Other Topics Concern  . Not on file  Social History Narrative   Patient is right-handed. She lives with her 5 children in a one story home. She drinks 3-4 cups of coffee and 1-2 sodas a day. She does not exercise.    REVIEW OF SYSTEMS: Constitutional: No fevers, chills, or sweats, no generalized  fatigue, change in appetite Eyes: No visual changes, double vision, eye pain Ear, nose and throat: No hearing loss, ear pain, nasal congestion, sore throat Cardiovascular: No chest pain, palpitations Respiratory:  No shortness of breath at rest or with exertion, wheezes GastrointestinaI: No nausea, vomiting, diarrhea, abdominal pain, fecal incontinence Genitourinary:  No dysuria, urinary retention or frequency Musculoskeletal:  No neck pain, back pain Integumentary: No rash, pruritus, skin lesions Neurological: as above Psychiatric: No depression, insomnia, anxiety Endocrine: No palpitations, fatigue, diaphoresis, mood swings, change in appetite, change in weight, increased thirst Hematologic/Lymphatic:  No purpura, petechiae. Allergic/Immunologic: no itchy/runny eyes, nasal congestion, recent allergic reactions, rashes  PHYSICAL EXAM: *** General: No acute distress.  Patient appears ***-groomed.  *** body habitus. Head:  Normocephalic/atraumatic Eyes:  Fundi examined but not visualized Neck: supple, no paraspinal tenderness, full range of motion  Heart:  Regular rate and rhythm Lungs:  Clear to auscultation bilaterally Back: No paraspinal tenderness Neurological Exam: alert and oriented to person, place, and time. Attention span and concentration intact, recent and remote memory intact, fund of knowledge intact.  Speech fluent and not dysarthric, language intact.  CN II-XII intact. Bulk and tone normal, muscle strength 5/5 throughout.  Sensation to light touch, temperature and vibration intact.  Deep tendon reflexes 2+ throughout, toes downgoing.  Finger to nose and heel to shin testing intact.  Gait normal, Romberg negative.  IMPRESSION: Migraine without aura, without status migrainosus, not intractable  PLAN: 1.  For preventative management, *** 2.  For abortive therapy, *** 3.  Limit use of pain relievers to no more than 2 days out of week to prevent risk of rebound or  medication-overuse headache. 4.  Keep headache diary 5.  Exercise, hydration, caffeine cessation, sleep hygiene, monitor for and avoid triggers 6.  Consider:  magnesium citrate 400mg  daily, riboflavin 400mg  daily, and coenzyme Q10 100mg  three times daily 7.  Follow up ***  Shon MilletAdam Jaffe, DO

## 2018-06-06 ENCOUNTER — Ambulatory Visit: Payer: Medicaid Other | Admitting: Neurology

## 2018-06-12 ENCOUNTER — Ambulatory Visit (HOSPITAL_COMMUNITY)
Admission: EM | Admit: 2018-06-12 | Discharge: 2018-06-12 | Disposition: A | Discharge status: Home / Self Care | Payer: Medicaid Other | Attending: Urgent Care | Admitting: Urgent Care

## 2018-06-12 ENCOUNTER — Encounter (HOSPITAL_COMMUNITY): Payer: Self-pay | Admitting: Emergency Medicine

## 2018-06-12 DIAGNOSIS — N921 Excessive and frequent menstruation with irregular cycle: Secondary | ICD-10-CM | POA: Insufficient documentation

## 2018-06-12 DIAGNOSIS — N898 Other specified noninflammatory disorders of vagina: Secondary | ICD-10-CM | POA: Insufficient documentation

## 2018-06-12 DIAGNOSIS — R102 Pelvic and perineal pain: Secondary | ICD-10-CM | POA: Insufficient documentation

## 2018-06-12 DIAGNOSIS — N926 Irregular menstruation, unspecified: Secondary | ICD-10-CM | POA: Insufficient documentation

## 2018-06-12 DIAGNOSIS — R1084 Generalized abdominal pain: Secondary | ICD-10-CM

## 2018-06-12 DIAGNOSIS — Z7251 High risk heterosexual behavior: Secondary | ICD-10-CM | POA: Insufficient documentation

## 2018-06-12 DIAGNOSIS — Z975 Presence of (intrauterine) contraceptive device: Secondary | ICD-10-CM | POA: Insufficient documentation

## 2018-06-12 DIAGNOSIS — Z8742 Personal history of other diseases of the female genital tract: Secondary | ICD-10-CM

## 2018-06-12 LAB — POCT URINALYSIS DIP (DEVICE)
GLUCOSE, UA: NEGATIVE mg/dL
KETONES UR: NEGATIVE mg/dL
LEUKOCYTE UA: NEGATIVE
Nitrite: NEGATIVE
PROTEIN: 30 mg/dL — AB
SPECIFIC GRAVITY, URINE: 1.02 (ref 1.005–1.030)
pH: 7 (ref 5.0–8.0)

## 2018-06-12 MED ORDER — FLUCONAZOLE 150 MG PO TABS: 150.0000 mg | ORAL_TABLET | ORAL | 0 refills | Status: DC

## 2018-06-12 MED ORDER — NAPROXEN 500 MG PO TABS: 500.0000 mg | ORAL_TABLET | Freq: Two times a day (BID) | ORAL | 1 refills | Status: DC

## 2018-06-12 NOTE — ED Triage Notes (Signed)
Pt sts lower abd pain and back pain; pt sts some vaginal itching

## 2018-06-12 NOTE — ED Provider Notes (Signed)
MRN: [id number] DOB: [birth year]  Subjective:   Alison Coffey is a 33 y.o. female presenting for 2 day history of recurrent lower abdominal pain. Has tried APAP with minimal-some relief. Has a history of uterine polyps, ovarian cyst. She is using Nexplanon 01/2018. LMP was 04/23/2018, was regular. Since then she has not had a cycle, has had some bleeding during and following sex on occasion. Has had some spotting. Has always had irregular cycles, had PID when she was 33 y/o.  Does not hydrate well, drinks mostly juice.  She does have a gynecologist at [facility].  No current facility-administered medications for this encounter.   Current Outpatient Medications:  .  cyclobenzaprine (FLEXERIL) 5 MG tablet, Take 1 tablet (5 mg total) by mouth at bedtime., Disp: 15 tablet, Rfl: 0 .  fluticasone (FLONASE) 50 MCG/ACT nasal spray, Place 2 sprays into both nostrils daily., Disp: 1 g, Rfl: 0 .  ipratropium (ATROVENT) 0.06 % nasal spray, Place 2 sprays into both nostrils 4 (four) times daily., Disp: 15 mL, Rfl: 0 .  naproxen (NAPROSYN) 500 MG tablet, Take 1 tablet (500 mg total) by mouth 2 (two) times daily with a meal., Disp: 30 tablet, Rfl: 1 .  SUMAtriptan (IMITREX) 100 MG tablet, Take 1 tablet earliest onset of migraine.  May repeat x1 in 2 hours if headache persists or recurs.  Do not exceed 2 tablets in 24h, Disp: 10 tablet, Rfl: 3 .  topiramate (TOPAMAX) 50 MG tablet, Take 1 tablet (50 mg total) by mouth at bedtime., Disp: 30 tablet, Rfl: 3    Allergies  Allergen Reactions  . Oxycodone     Percocet causes itching    Past Medical History:  Diagnosis Date  . Abnormal Pap smear 2005  . Anemia   . Depression   . Headache(784.0)   . Hypertension   . Pregnancy induced hypertension   . Preterm labor   . Sickle cell trait [facility])      Past Surgical History:  Procedure Laterality Date  . DILATION AND CURETTAGE OF UTERUS    . INDUCED ABORTION    . INDUCED ABORTION       ROS  Objective:   Vitals: BP 125/85 (BP Location: Right Arm)   Pulse 84   Temp 98.7 F (37.1 C) (Temporal)   Resp 18   SpO2 100%   Physical Exam Constitutional:      General: She is not in acute distress.    Appearance: Normal appearance. She is well-developed and normal weight. She is not ill-appearing, toxic-appearing or diaphoretic.  HENT:     Head: Normocephalic and atraumatic.     Right Ear: External ear normal.     Left Ear: External ear normal.     Nose: Nose normal.     Mouth/Throat:     Mouth: Mucous membranes are moist.     Pharynx: Oropharynx is clear.  Eyes:     General: No scleral icterus.    Extraocular Movements: Extraocular movements intact.     Pupils: Pupils are equal, round, and reactive to light.  Cardiovascular:     Rate and Rhythm: Normal rate and regular rhythm.     Heart sounds: Normal heart sounds. No murmur. No friction rub. No gallop.   Pulmonary:     Effort: Pulmonary effort is normal. No respiratory distress.     Breath sounds: Normal breath sounds. No stridor. No wheezing, rhonchi or rales.  Abdominal:     General: Bowel sounds are normal. There is no  distension.     Palpations: Abdomen is soft. There is no mass.     Tenderness: There is generalized abdominal tenderness (Worse over right side per patient as I was not able to do even light palpation secondary to patient's reported pain). There is no right CVA tenderness, left CVA tenderness, guarding or rebound. Negative signs include McBurney's sign.  Skin:    General: Skin is warm and dry.     Coloration: Skin is not pale.     Findings: No rash.  Neurological:     General: No focal deficit present.     Mental Status: She is alert and oriented to person, place, and time.  Psychiatric:        Mood and Affect: Mood normal.        Behavior: Behavior normal.        Thought Content: Thought content normal.        Judgment: Judgment normal.     Results for orders placed or performed  during the hospital encounter of 06/12/18 (from the past 24 hour(s))  POCT urinalysis dip (device)     Status: Abnormal   Collection Time: 06/12/18 11:11 AM  Result Value Ref Range   Glucose, UA NEGATIVE NEGATIVE mg/dL   Bilirubin Urine SMALL (A) NEGATIVE   Ketones, ur NEGATIVE NEGATIVE mg/dL   Specific Gravity, Urine 1.020 1.005 - 1.030   Hgb urine dipstick MODERATE (A) NEGATIVE   pH 7.0 5.0 - 8.0   Protein, ur 30 (A) NEGATIVE mg/dL   Urobilinogen, UA >=2.0 0.0 - 1.0 mg/dL   Nitrite NEGATIVE NEGATIVE   Leukocytes,Ua NEGATIVE NEGATIVE    Assessment and Plan :   Generalized abdominal pain  Irregular menstrual cycle  History of pelvic inflammatory disease  Unprotected sex  Breakthrough bleeding on Nexplanon  Vaginal discharge  Pelvic pain in female  Patient has significant gynecologic history. Abdominal exam was very difficult as patient winced with pain on the lightest touch (making contact with skin only).  I suspect her symptoms are likely related to recurrent ovarian cyst.  I counseled that we can refer her to the ER but her vital signs do not support that she is an ER patient.  She states that she will wait for her test results.  We will use naproxen to address her pain.  Given patient's report of history of yeast infections, will use Diflucan to address these vaginitis.  Recommend that she hydrate very well.  STI testing pending, patient declined empiric treatment in clinic today.  Strict ER precautions.  Otherwise make appointment to follow-up with gynecologist this week.   Wallis Bamberg, PA-C 06/12/18 1146

## 2018-06-12 NOTE — ED Notes (Signed)
Instructed on self swabbing

## 2018-06-12 NOTE — ED Notes (Signed)
Placed STD swab in lab/labeled.

## 2018-06-13 LAB — CERVICOVAGINAL ANCILLARY ONLY
Bacterial vaginitis: NEGATIVE
Candida vaginitis: NEGATIVE
Chlamydia: NEGATIVE
NEISSERIA GONORRHEA: NEGATIVE
Trichomonas: NEGATIVE

## 2018-06-13 LAB — URINE CULTURE

## 2018-06-22 ENCOUNTER — Other Ambulatory Visit: Payer: Self-pay

## 2018-06-22 ENCOUNTER — Ambulatory Visit (HOSPITAL_COMMUNITY)
Admission: EM | Admit: 2018-06-22 | Discharge: 2018-06-22 | Disposition: A | Discharge status: Home / Self Care | Payer: Self-pay | Attending: Family Medicine | Admitting: Family Medicine

## 2018-06-22 ENCOUNTER — Encounter (HOSPITAL_COMMUNITY): Payer: Self-pay | Admitting: Emergency Medicine

## 2018-06-22 DIAGNOSIS — M5432 Sciatica, left side: Secondary | ICD-10-CM

## 2018-06-22 HISTORY — DX: Carpal tunnel syndrome, unspecified upper limb: G56.00

## 2018-06-22 MED ORDER — CYCLOBENZAPRINE HCL 10 MG PO TABS: 5.0000 mg | ORAL_TABLET | Freq: Every day | ORAL | 0 refills | Status: DC

## 2018-06-22 MED ORDER — PREDNISONE 10 MG (21) PO TBPK: ORAL_TABLET | ORAL | 0 refills | Status: DC

## 2018-06-22 NOTE — ED Provider Notes (Signed)
[facility]    CSN: [id number] Arrival date & time: 06/22/18  4098     History   Chief Complaint Chief Complaint  Patient presents with  . Leg Pain    HPI Alison Coffey is a 33 y.o. female.   Pt is a 33 year old female that presents with left lower back pain, leg pain, and foot pain.  Describes the pain as sharp and stabbing at time and dull aching at others.  The pain mostly starts in the left upper anterior leg  and radiates into the foot.  She has had some muscle cramping in the foot.  There has been associated numbness and tingling.  This problem started Monday night and has been waxing and waning since.  She is not taking them for her symptoms.  Reports that she is able to bear weight and has been lying in the bed.  No saddle paresthesias, loss of bowel or bladder function.  No injuries to the back.  ROS per HPI    Leg Pain    Past Medical History:  Diagnosis Date  . Abnormal Pap smear 2005  . Anemia   . Carpal tunnel syndrome   . Depression   . Headache(784.0)   . Hypertension   . Pregnancy induced hypertension   . Preterm labor   . Sickle cell trait [facility])     Patient Active Problem List   Diagnosis Date Noted  . Preterm labor   . Ovarian cyst 11/22/2017  . Pelvic pain 11/22/2017  . 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 DILATION AND CURETTAGE OF UTERUS    . INDUCED ABORTION    . INDUCED ABORTION      OB History    Gravida  10   Para  5   Term  4   Preterm  1   AB  5   Living  5     SAB      TAB  5   Ectopic      Multiple      Live Births  5            Home Medications    Prior to Admission medications   Medication Sig Start Date End Date Taking? Authorizing Provider  fluconazole (DIFLUCAN) 150 MG tablet Take 1 tablet (150 mg total) by mouth once a week. 06/12/18  Yes Wallis Bamberg, PA-C  naproxen (NAPROSYN) 500 MG tablet Take 1 tablet (500  mg total) by mouth 2 (two) times daily with a meal. 06/12/18  Yes Wallis Bamberg, PA-C  cyclobenzaprine (FLEXERIL) 10 MG tablet Take 0.5 tablets (5 mg total) by mouth at bedtime. 06/22/18   Gearold Wainer, Gloris Manchester A, NP  fluticasone (FLONASE) 50 MCG/ACT nasal spray Place 2 sprays into both nostrils daily. 02/07/18   Cathie Hoops, Amy V, PA-C  ipratropium (ATROVENT) 0.06 % nasal spray Place 2 sprays into both nostrils 4 (four) times daily. 02/07/18   Cathie Hoops, Amy V, PA-C  predniSONE (STERAPRED UNI-PAK 21 TAB) 10 MG (21) TBPK tablet 6 tabs for 1 day, then 5 tabs for 1 das, then 4 tabs for 1 day, then 3 tabs for 1 day, 2 tabs for 1 day, then 1 tab for 1 day 06/22/18   Dahlia Byes A, NP  SUMAtriptan (IMITREX) 100 MG tablet Take 1 tablet earliest onset of migraine.  May repeat x1 in 2 hours if headache persists or recurs.  Do not exceed  2 tablets in 24h 01/19/18   Everlena Cooper, Adam R, DO  topiramate (TOPAMAX) 50 MG tablet Take 1 tablet (50 mg total) by mouth at bedtime. 01/19/18   Drema Dallas, DO    Family History Family History  Problem Relation Age of Onset  . Hypertension Mother   . Hypertension Father   . Atrial fibrillation Father   . High Cholesterol Father   . Diabetes Maternal Aunt   . Sickle cell trait Daughter   . Cancer Sister        PAP  . Other Neg Hx     Social History Social History   Tobacco Use  . Smoking status: Current Every Day Smoker    Packs/day: 0.50    Types: Cigarettes  . Smokeless tobacco: Never Used  Substance Use Topics  . Alcohol use: Yes    Comment: occ  . Drug use: No     Allergies   Oxycodone   Review of Systems Review of Systems   Physical Exam Triage Vital Signs ED Triage Vitals  Enc Vitals Group     BP 06/22/18 0930 124/71     Pulse Rate 06/22/18 0930 88     Resp 06/22/18 0930 18     Temp 06/22/18 0930 98.1 F (36.7 C)     Temp Source 06/22/18 0930 Oral     SpO2 06/22/18 0930 100 %     Weight --      Height --      Head Circumference --      Peak Flow --      Pain  Score 06/22/18 0935 10     Pain Loc --      Pain Edu? --      Excl. in GC? --    No data found.  Updated Vital Signs BP 124/71 (BP Location: Left Arm)   Pulse 88   Temp 98.1 F (36.7 C) (Oral)   Resp 18   LMP 04/23/2018 Comment: has nexplanon  SpO2 100%   Visual Acuity Right Eye Distance:   Left Eye Distance:   Bilateral Distance:    Right Eye Near:   Left Eye Near:    Bilateral Near:     Physical Exam Vitals signs and nursing note reviewed.  Constitutional:      General: She is not in acute distress.    Appearance: Normal appearance. She is not ill-appearing or toxic-appearing.  HENT:     Head: Normocephalic and atraumatic.     Nose: Nose normal.  Eyes:     Conjunctiva/sclera: Conjunctivae normal.  Neck:     Musculoskeletal: Normal range of motion.  Pulmonary:     Effort: Pulmonary effort is normal.  Musculoskeletal:        General: Tenderness present. No swelling, deformity or signs of injury.     Right lower leg: No edema.     Left lower leg: No edema.     Comments: No specific tenderness to the lower lumbar spine or paravertebral musculature. No obvious swelling or deformities Some tenderness to the left upper anterior leg. Positive SLR  Skin:    General: Skin is warm and dry.     Findings: No rash.  Neurological:     Mental Status: She is alert.  Psychiatric:        Mood and Affect: Mood normal.      UC Treatments / Results  Labs (all labs ordered are listed, but only abnormal results are displayed) Labs Reviewed - No data  to display  EKG None  Radiology No results found.  Procedures Procedures (including critical care time)  Medications Ordered in UC Medications - No data to display  Initial Impression / Assessment and Plan / UC Course  I have reviewed the triage vital signs and the nursing notes.  Pertinent labs & imaging results that were available during my care of the patient were reviewed by me and considered in my medical  decision making (see chart for details).     Symptoms consistent with radiculopathy on left side. Will treat with prednisone taper for 6 days Low-dose muscle relaxer for bedtime Follow up as needed for continued or worsening symptoms  Final Clinical Impressions(s) / UC Diagnoses   Final diagnoses:  Sciatica of left side     Discharge Instructions     We are treating you for sciatic nerve pain and muscle spasm Prednisone taper. Take this with food Muscle relaxer as needed at bedtime. This may cause drowsiness Gentle stretching and massage  Follow up as needed for continued or worsening symptoms     ED Prescriptions    Medication Sig Dispense Auth. Provider   predniSONE (STERAPRED UNI-PAK 21 TAB) 10 MG (21) TBPK tablet 6 tabs for 1 day, then 5 tabs for 1 das, then 4 tabs for 1 day, then 3 tabs for 1 day, 2 tabs for 1 day, then 1 tab for 1 day 21 tablet Mani Celestin A, NP   cyclobenzaprine (FLEXERIL) 10 MG tablet Take 0.5 tablets (5 mg total) by mouth at bedtime. 20 tablet Dahlia Byes A, NP     Controlled Substance Prescriptions [place] Controlled Substance Registry consulted? Not Applicable   Janace Aris, NP 06/22/18 1044

## 2018-06-22 NOTE — ED Triage Notes (Signed)
Onset Monday night of cramp in left foot.  Tuesday had tingling, numb sensation from hip to toes.  Sensation continues.  Numb, but throbbing pain in left lower back, knee and foot.

## 2018-06-22 NOTE — Discharge Instructions (Addendum)
We are treating you for sciatic nerve pain and muscle spasm Prednisone taper. Take this with food Muscle relaxer as needed at bedtime. This may cause drowsiness Gentle stretching and massage  Follow up as needed for continued or worsening symptoms

## 2018-08-17 ENCOUNTER — Other Ambulatory Visit: Payer: Self-pay

## 2018-08-17 ENCOUNTER — Encounter (HOSPITAL_COMMUNITY): Payer: Self-pay

## 2018-08-17 ENCOUNTER — Ambulatory Visit (HOSPITAL_COMMUNITY)
Admission: EM | Admit: 2018-08-17 | Discharge: 2018-08-17 | Disposition: A | Discharge status: Home / Self Care | Payer: Medicaid Other | Attending: Family Medicine | Admitting: Family Medicine

## 2018-08-17 DIAGNOSIS — R112 Nausea with vomiting, unspecified: Secondary | ICD-10-CM

## 2018-08-17 DIAGNOSIS — K529 Noninfective gastroenteritis and colitis, unspecified: Secondary | ICD-10-CM

## 2018-08-17 DIAGNOSIS — R197 Diarrhea, unspecified: Secondary | ICD-10-CM

## 2018-08-17 MED ORDER — ONDANSETRON HCL 4 MG PO TABS: 4.0000 mg | ORAL_TABLET | Freq: Four times a day (QID) | ORAL | 0 refills | Status: AC

## 2018-08-17 NOTE — Discharge Instructions (Signed)
Get rest and push fluids You should be drinking at least half your body weight in ounces You may supplement with OTC pedialyate or oral rehydration solution Zofran prescribed.  Take as directed.    DIET Instructions:  30 minutes after taking nausea medicine, begin with sips of clear liquids. If able to hold down 2 - 4 ounces for 30 minutes, begin drinking more. Increase your fluid intake to replace losses. Clear liquids only for 24 hours (water, tea, sport drinks, clear flat ginger ale or cola and juices, broth, jello, popsicles, ect). Advance to bland foods, applesauce, rice, baked or boiled chicken, ect. Avoid milk, greasy foods and anything that doesnt agree with you.  If you experience new or worsening symptoms return or go to ER such as fever, chills, nausea, vomiting, diarrhea, bloody or dark tarry stools, constipation, urinary symptoms, worsening abdominal discomfort, symptoms that do not improve with medications, inability to keep fluids down, etc..Marland Kitchen

## 2018-08-17 NOTE — ED Triage Notes (Signed)
Pt states she has diarrhea and vomiting. This has been going since Sunday.

## 2018-08-17 NOTE — ED Provider Notes (Signed)
[facility]   [id number] 08/17/18 Arrival Time: [phone]  CC: Nausea, vomiting, and diarrhea  SUBJECTIVE:  Alison Coffey is a 33 y.o. female hx significant for CTS, depression, and HTN, who presents with complaint of improving nausea, vomiting (unsure of amount), and diarrhea (multiple times, a few episodes this morning) x 3 days.  States diarrhea was initially watery, but not more formed.  Denies a precipitating event, trauma, close contacts with similar symptoms, recent travel or antibiotic use.  Complains of mild abdominal cramping associated with symptoms.  Has tried limiting her food intake with relief.  Attempted to eat half a slice of pizza, and "went right through" her.   States she has not been drinking water, but has been trying to drink sodas.  Reports similar symptoms in the past.  Last BM this morning with looser stools.  Denies fever, chills, chest pain, SOB, constipation, hematochezia, melena, dysuria, difficulty urinating, increased frequency or urgency, flank pain, loss of bowel or bladder function.  Patient's last menstrual period was 08/17/2018.  ROS: As per HPI.  Past Medical History:  Diagnosis Date  . Abnormal Pap smear 2005  . Anemia   . Carpal tunnel syndrome   . Depression   . Headache(784.0)   . Hypertension   . Pregnancy induced hypertension   . Preterm labor   . Sickle cell trait [facility](HCC)    Past Surgical History:  Procedure Laterality Date  . DILATION AND CURETTAGE OF UTERUS    . INDUCED ABORTION    . INDUCED ABORTION     Allergies  Allergen Reactions  . Oxycodone     Percocet causes itching   No current facility-administered medications on file prior to encounter.    Current Outpatient Medications on File Prior to Encounter  Medication Sig Dispense Refill  . SUMAtriptan (IMITREX) 100 MG tablet Take 1 tablet earliest onset of migraine.  May repeat x1 in 2 hours if headache persists or recurs.  Do not exceed 2 tablets in 24h 10 tablet 3   . topiramate (TOPAMAX) 50 MG tablet Take 1 tablet (50 mg total) by mouth at bedtime. 30 tablet 3   Social History   Socioeconomic History  . Marital status: Single    Spouse name: Not on file  . Number of children: 5  . Years of education: Not on file  . Highest education level: 12th grade  Occupational History  . Occupation: collections  Social Needs  . Financial resource strain: Not on file  . Food insecurity:    Worry: Not on file    Inability: Not on file  . Transportation needs:    Medical: Not on file    Non-medical: Not on file  Tobacco Use  . Smoking status: Current Every Day Smoker    Packs/day: 0.50    Types: Cigarettes  . Smokeless tobacco: Never Used  Substance and Sexual Activity  . Alcohol use: Yes    Comment: occ  . Drug use: No  . Sexual activity: Not on file  Lifestyle  . Physical activity:    Days per week: Not on file    Minutes per session: Not on file  . Stress: Not on file  Relationships  . Social connections:    Talks on phone: Not on file    Gets together: Not on file    Attends religious service: Not on file    Active member of club or organization: Not on file    Attends meetings of clubs or organizations: Not  on file    Relationship status: Not on file  . Intimate partner violence:    Fear of current or ex partner: Not on file    Emotionally abused: Not on file    Physically abused: Not on file    Forced sexual activity: Not on file  Other Topics Concern  . Not on file  Social History Narrative   Patient is right-handed. She lives with her 5 children in a one story home. She drinks 3-4 cups of coffee and 1-2 sodas a day. She does not exercise.   Family History  Problem Relation Age of Onset  . Hypertension Mother   . Hypertension Father   . Atrial fibrillation Father   . High Cholesterol Father   . Diabetes Maternal Aunt   . Sickle cell trait Daughter   . Cancer Sister        PAP  . Other Neg Hx      OBJECTIVE:  Vitals:    08/17/18 1009 08/17/18 1011 08/17/18 1046  BP:  98/61 112/78  Temp:  98.6 F (37 C)   SpO2:  100%   Weight: 185 lb (83.9 kg)      General appearance: Alert; appears mildly fatigued; laying on exam table HEENT: NCAT.  Oropharynx clear.  Lungs: clear to auscultation bilaterally without adventitious breath sounds Heart: regular rate and rhythm.  Radial pulses 2+ symmetrical bilaterally Abdomen: soft, non-distended; normal active bowel sounds; mild diffuse tenderness; no guarding Back: no CVA tenderness Extremities: no edema; symmetrical with no gross deformities Skin: warm and dry Neurologic: normal gait Psychological: alert and cooperative; normal mood and affect  ASSESSMENT & PLAN:  1. Gastroenteritis   2. Nausea vomiting and diarrhea     Meds ordered this encounter  Medications  . ondansetron (ZOFRAN) 4 MG tablet    Sig: Take 1 tablet (4 mg total) by mouth every 6 (six) hours.    Dispense:  12 tablet    Refill:  0    Order Specific Question:   Supervising Provider    Answer:   Eustace Moore [[id number]]   Get rest and push fluids You should be drinking at least half your body weight in ounces You may supplement with OTC pedialyate or oral rehydration solution Zofran prescribed.  Take as directed.    DIET Instructions:  30 minutes after taking nausea medicine, begin with sips of clear liquids. If able to hold down 2 - 4 ounces for 30 minutes, begin drinking more. Increase your fluid intake to replace losses. Clear liquids only for 24 hours (water, tea, sport drinks, clear flat ginger ale or cola and juices, broth, jello, popsicles, ect). Advance to bland foods, applesauce, rice, baked or boiled chicken, ect. Avoid milk, greasy foods and anything that doesn't agree with you.  If you experience new or worsening symptoms return or go to ER such as fever, chills, nausea, vomiting, diarrhea, bloody or dark tarry stools, constipation, urinary symptoms, worsening abdominal  discomfort, symptoms that do not improve with medications, inability to keep fluids down, etc...  Reviewed expectations re: course of current medical issues. Questions answered. Outlined signs and symptoms indicating need for more acute intervention. Patient verbalized understanding. After Visit Summary given.   Rennis Harding, PA-C 08/17/18 1125

## 2018-11-03 ENCOUNTER — Ambulatory Visit (INDEPENDENT_AMBULATORY_CARE_PROVIDER_SITE_OTHER)
Admission: RE | Admit: 2018-11-03 | Discharge: 2018-11-03 | Disposition: A | Discharge status: Home / Self Care | Payer: Self-pay | Source: Ambulatory Visit

## 2018-11-03 DIAGNOSIS — N939 Abnormal uterine and vaginal bleeding, unspecified: Secondary | ICD-10-CM

## 2018-11-03 NOTE — ED Provider Notes (Signed)
Virtual Visit via Video Note:  Alison Coffey  initiated request for Telemedicine visit with [facility] team. I connected with Domenick Bookbinder  on 11/03/2018 at 5:59 PM  for a synchronized telemedicine visit using a video enabled HIPPA compliant telemedicine application. I verified that I am speaking with Domenick Bookbinder  using two identifiers. Zigmund Gottron, NP  was physically located in a [facility] Urgent care site and Ellinore Merced was located at a different location.   The limitations of evaluation and management by telemedicine as well as the availability of in-person appointments were discussed. Patient was informed that she  may incur a bill ( including co-pay) for this virtual visit encounter. [name]  expressed understanding and gave verbal consent to proceed with virtual visit.     History of Present Illness:Alison Coffey  is a 33 y.o. female presents with complaints of migraine which started 4 days ago. Hx of migraines. Then started feels weak. Has a nexplanon implant, period started 7/1. Still having bleeding. States hx of anemia. Back pain. Nausea. No vomiting. Low energy. Abdominal cramping. Tylenol to help with headache, barely managing. Got the nexplanon in November. Typically her period is about every other month and lasts 3-5 days. Had a period last month. Bleeding has decreased some, has to change her pad or tampon every 4-5 hours. No fevers. Pelvic cramping unchanged. [facility], doesn't have a gynecologist or PCP. No dizziness, no loc. No other vaginal discharge itching or rash. Sexually active with 1 partner, doesn't use condoms. Denies std concern. No urinary symptoms.   Past Medical History:  Diagnosis Date  . Abnormal Pap smear 2005  . Anemia   . Carpal tunnel syndrome   . Depression   . Headache(784.0)   . Hypertension   . Pregnancy induced hypertension   . Preterm labor   . Sickle cell trait (HCC)     Allergies  Allergen Reactions  . Oxycodone     Percocet causes itching        Observations/Objective: Alert, oriented, non toxic in appearance. Clear coherent speech without difficulty. No increased work of breathing visualized.    Assessment and Plan: Abnormal period related to nexplanon vs pregnancy vs infection vs anemia discussed. Limited ability to evaluated via video. Recommended at minimum to take a home pregnancy test, start taking iron and an nsaid, but likely needs to be seen for further evaluation of any persistent symptoms. Return precautions provided. Patient verbalized understanding and agreeable to plan.    Follow Up Instructions:    I discussed the assessment and treatment plan with the patient. The patient was provided an opportunity to ask questions and all were answered. The patient agreed with the plan and demonstrated an understanding of the instructions.   The patient was advised to call back or seek an in-person evaluation if the symptoms worsen or if the condition fails to improve as anticipated.  I provided 13 minutes of non-face-to-face time during this encounter.    Zigmund Gottron, NP  11/03/2018 5:59 PM         Zigmund Gottron, NP 11/03/18 1932

## 2018-11-03 NOTE — Discharge Instructions (Signed)
I would recommend a pregnancy test.  May need further evaluation to check your blood levels and ensure no infectious process as source of bleeding.  Naproxen/aleve or ibuprofen may help with cramping and bleeding.  May supplement your iroon and increase it in your diet.

## 2018-11-28 ENCOUNTER — Other Ambulatory Visit: Payer: Self-pay | Admitting: Obstetrics

## 2018-11-30 ENCOUNTER — Ambulatory Visit
Admission: EM | Admit: 2018-11-30 | Discharge: 2018-11-30 | Disposition: A | Discharge status: Home / Self Care | Payer: Medicaid Other | Attending: Physician Assistant | Admitting: Physician Assistant

## 2018-11-30 DIAGNOSIS — R51 Headache: Secondary | ICD-10-CM

## 2018-11-30 DIAGNOSIS — R519 Headache, unspecified: Secondary | ICD-10-CM

## 2018-11-30 MED ORDER — FLUTICASONE PROPIONATE 50 MCG/ACT NA SUSP: 2.0000 | Freq: Every day | NASAL | 0 refills | Status: AC

## 2018-11-30 MED ORDER — PREDNISONE 50 MG PO TABS: 50.0000 mg | ORAL_TABLET | Freq: Every day | ORAL | 0 refills | Status: AC

## 2018-11-30 NOTE — Discharge Instructions (Signed)
Start prednisone as directed. Flonase for sinus pressure. Keep hydrated, urine should be clear to pale yellow in color. If sudden worsening of headache, weakness, dizziness, passing out, go to the ED for further evaluation needed.

## 2018-11-30 NOTE — ED Provider Notes (Signed)
[facility]    CSN: [id number] Arrival date & time: 11/30/18  1909     History   Chief Complaint Chief Complaint  Patient presents with  . Migraine    HPI Alison Coffey is a 33 y.o. female.   33 year old female comes in for 3 day history of headache. Headache is to the frontal sinus region, with pressure to the eyes. Has photophobia without phonophobia, nausea, vomiting. Denies head injury/loss of consciousness. Denies fever, chills. Denies URI symptoms such as cough, congestion, sore throat.  She started noticing back pain yesterday.  Denies injury/trauma.  States for the past 2 nights has felt asleep on the couch.  She usually takes sumatriptan for her migraines, but given medication makes her drowsy and she had to take care of her kids, has not been taking.  She has been taking ibuprofen without relief.      Past Medical History:  Diagnosis Date  . Abnormal Pap smear 2005  . Anemia   . Carpal tunnel syndrome   . Depression   . Headache(784.0)   . Hypertension   . Pregnancy induced hypertension   . Preterm labor   . Sickle cell trait [facility](HCC)     Patient Active Problem List   Diagnosis Date Noted  . Preterm labor   . Ovarian cyst 11/22/2017  . Pelvic pain 11/22/2017  . 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 DILATION AND CURETTAGE OF UTERUS    . INDUCED ABORTION    . INDUCED ABORTION      OB History    Gravida  10   Para  5   Term  4   Preterm  1   AB  5   Living  5     SAB      TAB  5   Ectopic      Multiple      Live Births  5            Home Medications    Prior to Admission medications   Medication Sig Start Date End Date Taking? Authorizing Provider  fluticasone (FLONASE) 50 MCG/ACT nasal spray Place 2 sprays into both nostrils daily. 11/30/18   Cathie HoopsYu, Alandra Sando V, PA-C  ondansetron (ZOFRAN) 4 MG tablet Take 1 tablet (4 mg total) by mouth every 6  (six) hours. 08/17/18   Wurst, GrenadaBrittany, PA-C  predniSONE (DELTASONE) 50 MG tablet Take 1 tablet (50 mg total) by mouth daily with breakfast. 11/30/18   Cathie HoopsYu, Giulian Goldring V, PA-C  SUMAtriptan (IMITREX) 100 MG tablet Take 1 tablet earliest onset of migraine.  May repeat x1 in 2 hours if headache persists or recurs.  Do not exceed 2 tablets in 24h 01/19/18   Everlena CooperJaffe, Adam R, DO  topiramate (TOPAMAX) 50 MG tablet Take 1 tablet (50 mg total) by mouth at bedtime. 01/19/18   Drema DallasJaffe, Adam R, DO    Family History Family History  Problem Relation Age of Onset  . Hypertension Mother   . Hypertension Father   . Atrial fibrillation Father   . High Cholesterol Father   . Diabetes Maternal Aunt   . Sickle cell trait Daughter   . Cancer Sister        PAP  . Other Neg Hx     Social History Social History   Tobacco Use  . Smoking status: Current Every Day Smoker    Packs/day: 0.50  Types: Cigarettes  . Smokeless tobacco: Never Used  Substance Use Topics  . Alcohol use: Yes    Comment: occ  . Drug use: No     Allergies   Oxycodone   Review of Systems Review of Systems  Reason unable to perform ROS: See HPI as above.     Physical Exam Triage Vital Signs ED Triage Vitals [11/30/18 1925]  Enc Vitals Group     BP 112/78     Pulse Rate 92     Resp 18     Temp 98.6 F (37 C)     Temp Source Oral     SpO2 96 %     Weight      Height      Head Circumference      Peak Flow      Pain Score 8     Pain Loc      Pain Edu?      Excl. in [place]?    No data found.  Updated Vital Signs BP 112/78 (BP Location: Left Arm)   Pulse 92   Temp 98.6 F (37 C) (Oral)   Resp 18   SpO2 96%   Visual Acuity Right Eye Distance:   Left Eye Distance:   Bilateral Distance:    Right Eye Near:   Left Eye Near:    Bilateral Near:     Physical Exam Constitutional:      General: She is not in acute distress.    Appearance: She is well-developed. She is not diaphoretic.  HENT:     Head: Normocephalic and  atraumatic.     Right Ear: Ear canal and external ear normal. Tympanic membrane is erythematous. Tympanic membrane is not bulging.     Left Ear: Ear canal and external ear normal. Tympanic membrane is erythematous. Tympanic membrane is not bulging.     Nose:     Right Sinus: Frontal sinus tenderness present. No maxillary sinus tenderness.     Left Sinus: Frontal sinus tenderness present. No maxillary sinus tenderness.  Eyes:     Extraocular Movements: Extraocular movements intact.     Conjunctiva/sclera: Conjunctivae normal.     Pupils: Pupils are equal, round, and reactive to light.  Neck:     Musculoskeletal: Normal range of motion and neck supple.  Cardiovascular:     Rate and Rhythm: Normal rate and regular rhythm.     Heart sounds: Normal heart sounds. No murmur. No friction rub. No gallop.   Pulmonary:     Effort: Pulmonary effort is normal. No accessory muscle usage or respiratory distress.     Breath sounds: Normal breath sounds. No stridor. No decreased breath sounds, wheezing, rhonchi or rales.  Musculoskeletal:     Comments: Diffuse tenderness to palpation of mid thoracic back.  No focal tenderness to palpation of the spinous processes.  Full range of motion of back and shoulder.  Skin:    General: Skin is warm and dry.  Neurological:     Mental Status: She is alert and oriented to person, place, and time.     GCS: GCS eye subscore is 4. GCS verbal subscore is 5. GCS motor subscore is 6.     Cranial Nerves: Cranial nerves are intact.     Sensory: Sensation is intact.     Coordination: Coordination is intact.     Gait: Gait is intact.      UC Treatments / Results  Labs (all labs ordered are listed, but only abnormal  results are displayed) Labs Reviewed - No data to display  EKG   Radiology No results found.  Procedures Procedures (including critical care time)  Medications Ordered in UC Medications - No data to display  Initial Impression / Assessment and  Plan / UC Course  I have reviewed the triage vital signs and the nursing notes.  Pertinent labs & imaging results that were available during my care of the patient were reviewed by me and considered in my medical decision making (see chart for details).    Patient would like to defer headache cocktail, stating it can make her drowsy.  History and exam are consistent with sinus pressure, to start prednisone and Flonase as directed.  Push fluids.  Return precautions given.  Patient expresses understanding and agrees to plan.  Final Clinical Impressions(s) / UC Diagnoses   Final diagnoses:  Sinus headache    ED Prescriptions    Medication Sig Dispense Auth. Provider   predniSONE (DELTASONE) 50 MG tablet Take 1 tablet (50 mg total) by mouth daily with breakfast. 5 tablet Deyonna Fitzsimmons V, PA-C   fluticasone (FLONASE) 50 MCG/ACT nasal spray Place 2 sprays into both nostrils daily. 1 g Threasa AlphaYu, Benita Boonstra V, PA-C        Emari Demmer V, [place] JerseyPA-C 11/30/18 [phone]

## 2018-11-30 NOTE — ED Triage Notes (Signed)
Pt c/o headaches x3 days, hx of migraines but also having back pain. States took Advil with no relief

## 2018-12-05 ENCOUNTER — Ambulatory Visit (INDEPENDENT_AMBULATORY_CARE_PROVIDER_SITE_OTHER)
Admission: RE | Admit: 2018-12-05 | Discharge: 2018-12-05 | Disposition: A | Discharge status: Home / Self Care | Payer: Self-pay | Source: Ambulatory Visit

## 2018-12-05 DIAGNOSIS — F330 Major depressive disorder, recurrent, mild: Secondary | ICD-10-CM

## 2018-12-05 MED ORDER — HYDROXYZINE HCL 25 MG PO TABS: 25.0000 mg | ORAL_TABLET | Freq: Four times a day (QID) | ORAL | 0 refills | Status: AC

## 2018-12-05 MED ORDER — FLUOXETINE HCL 10 MG PO TABS: 10.0000 mg | ORAL_TABLET | Freq: Every day | ORAL | 0 refills | Status: AC

## 2018-12-05 NOTE — ED Provider Notes (Signed)
Virtual Visit via Video Note:  Alison Coffey  initiated request for Telemedicine visit with [facility] Urgent Care team. I connected with Alison Coffey  on 12/05/2018 at 6:31 PM  for a synchronized telemedicine visit using a video enabled HIPPA compliant telemedicine application. I verified that I am speaking with Alison Coffey  using two identifiers.  C , PA-C  was physically located in a [facility] Urgent care site and Alison Coffey was located at a different location.   The limitations of evaluation and management by telemedicine as well as the availability of in-person appointments were discussed. Patient was informed that she  may incur a bill ( including co-pay) for this virtual visit encounter. Alison Coffey  expressed understanding and gave verbal consent to proceed with virtual visit.     History of Present Illness:Alison Coffey  is a 33 y.o. female presents for evaluation of anxiety/depression.  Patient states that over the past 1 to 2 weeks she has had worsening anxiety and depression.  She has history of bipolar disorder.  Feels recently she has had very low motivation and lack of interest in doing anything.  She denies any thoughts of SI/HI, but has had thoughts of wondering about her existence.  She feels work and personal reasons have triggered her symptoms to worsen recently.  She has had decreased appetite and changes in sleep patterns.  She states that she was previously on lithium and Xanax.  Does not currently have PCP.  Occasionally will have episodes of feeling anxious and feeling short of breath.    Allergies  Allergen Reactions  . Oxycodone     Percocet causes itching     Past Medical History:  Diagnosis Date  . Abnormal Pap smear 2005  . Anemia   . Carpal tunnel syndrome   . Depression   . Headache(784.0)   . Hypertension   . Pregnancy induced hypertension   . Preterm labor   . Sickle cell trait (HCC)      Social History    Tobacco Use  . Smoking status: Current Every Day Smoker    Packs/day: 0.50    Types: Cigarettes  . Smokeless tobacco: Never Used  Substance Use Topics  . Alcohol use: Yes    Comment: occ  . Drug use: No        Observations/Objective: Physical Exam  Constitutional: She is oriented to person, place, and time and well-developed, well-nourished, and in no distress. No distress.  HENT:  Head: Normocephalic and atraumatic.  Neck: Normal range of motion.  Pulmonary/Chest: Effort normal. No respiratory distress.  Speaking in full sentences  Musculoskeletal:     Comments: Moving extremities appropriately  Neurological: She is alert and oriented to person, place, and time.  Speech clear, face symmetric     Assessment and Plan:    ICD-10-CM   1. Mild episode of recurrent major depressive disorder ([place])  F33.0     Patient symptoms seem to be more related to depression with occasional episodes of increased anxiety.  Will initiate on Prozac daily as well as provide hydroxyzine to use as needed.  Discussed drowsiness regarding hydroxyzine.  Discussed importance of following up with PCP/psych for further evaluation and management of symptoms.  Not reinitiating lithium given needing to monitor.  Advised if she prefers that she needs to be seen by PCP and psych.  Provided contacts for follow-up that except no insurance.  Patient does not seem a threat to self or others at this time.  Discussed  if she develops these thoughts [facility]   Follow Up Instructions:     I discussed the assessment and treatment plan with the patient. The patient was provided an opportunity to ask questions and all were answered. The patient agreed with the plan and demonstrated an understanding of the instructions.   The patient was advised to call back or seek an in-person evaluation if the symptoms worsen or if the condition fails to improve as anticipated.      Lew Dawes C  , PA-C  12/05/2018 6:31 PM         Lew Dawes,  C, PA-C 12/05/18 1832

## 2018-12-05 NOTE — Discharge Instructions (Addendum)
Begin daily prozac Hydroxyzine as needed at home/bedtime- do not drive or work after taking  Follow up with primary care

## 2018-12-07 ENCOUNTER — Telehealth (HOSPITAL_COMMUNITY): Payer: Self-pay | Admitting: Emergency Medicine

## 2018-12-07 NOTE — Telephone Encounter (Signed)
Pt employer called asking about the validity of this patients note. Spoke with employer that we are unable to provide confirmation of this specific patients work note, but I described what a generic blank work note looks like. Any further information needed will require permission from the patient herself.

## 2018-12-11 IMAGING — CT CT RENAL STONE PROTOCOL
2 of 4 series · 16 of 46 positions shown, 18 images · non-contrast
Comparison: None.

CLINICAL DATA: 31-year-old female with low back pain radiating into
the right flank for the past 3 days. Possible renal stones.

EXAM:
CT ABDOMEN AND PELVIS WITHOUT CONTRAST
TECHNIQUE: Multidetector CT imaging of the abdomen and pelvis was performed
following the standard protocol without IV contrast.

[Series 2: axial st · axial · 0.80mm/px · z∈[-423,-3]mm · 13 of 92 slices shown, 15 images]
[im 4/92  soft-tissue]
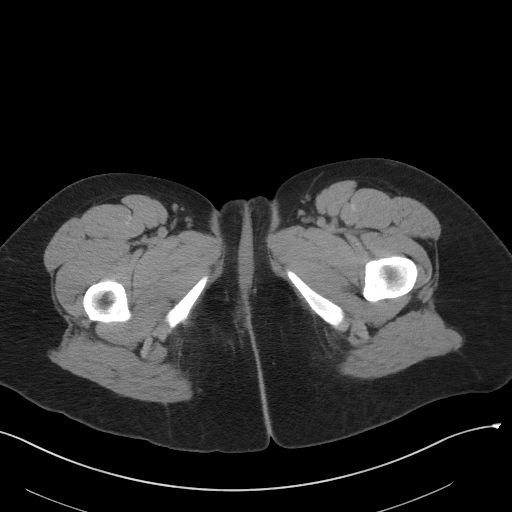
[im 4/92  bone]
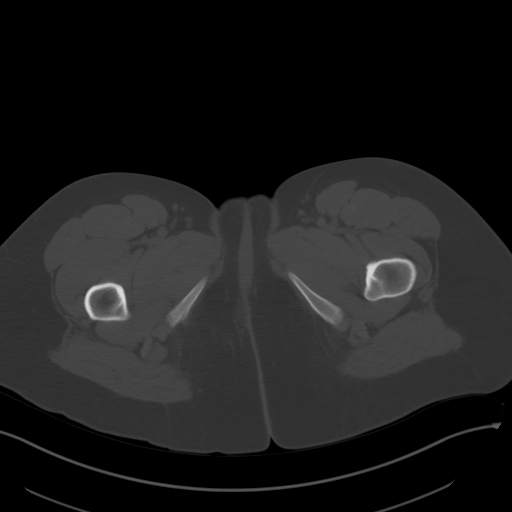
[im 11/92  soft-tissue]
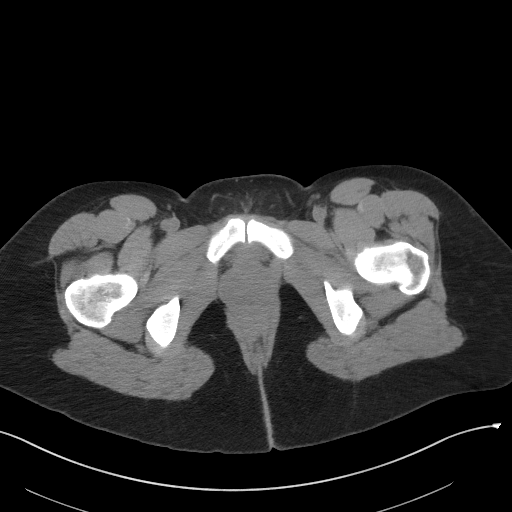
[im 18/92  soft-tissue]
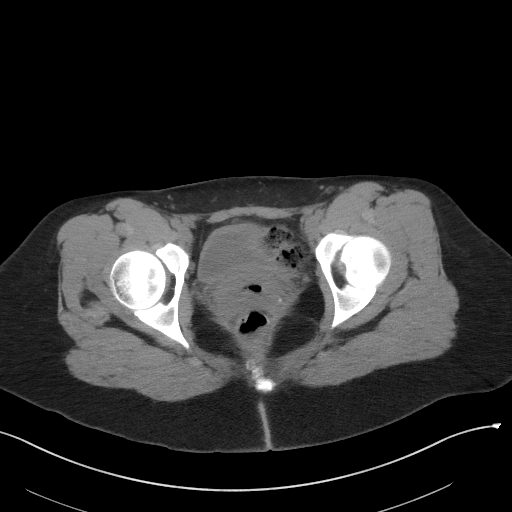
[im 25/92  soft-tissue]
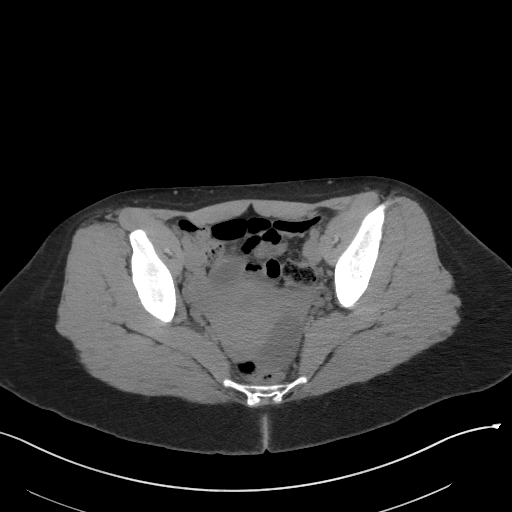
[im 32/92  soft-tissue]
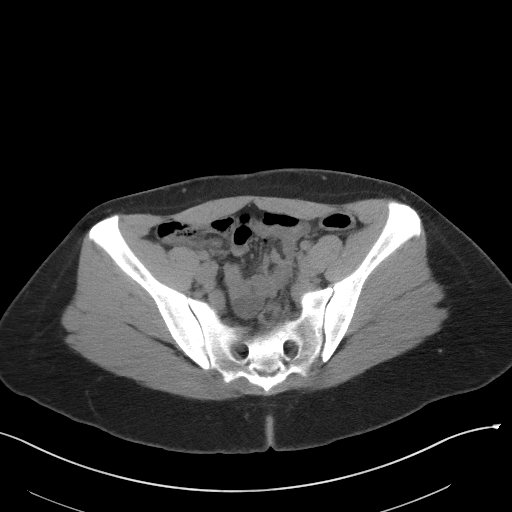
[im 39/92  soft-tissue]
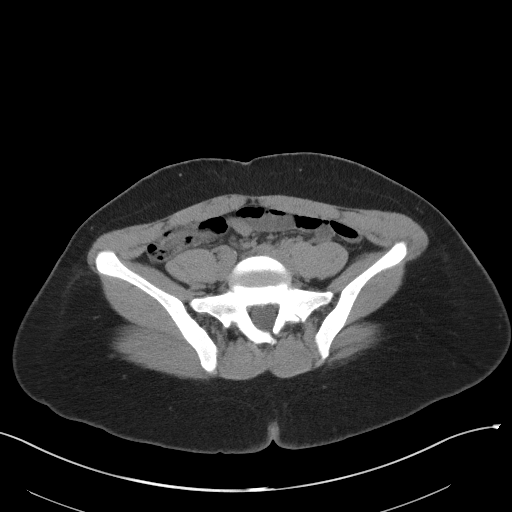
[im 46/92  soft-tissue]
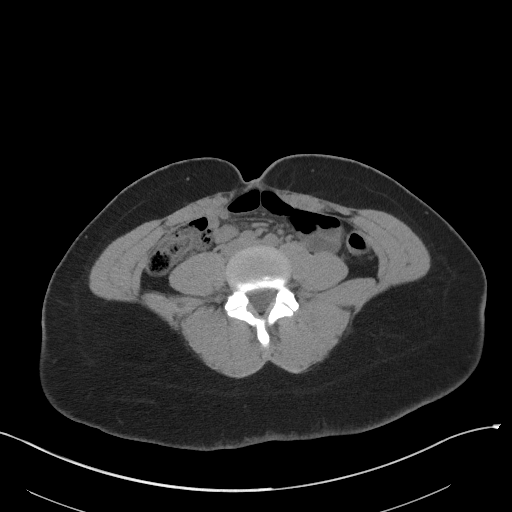
[im 53/92  soft-tissue]
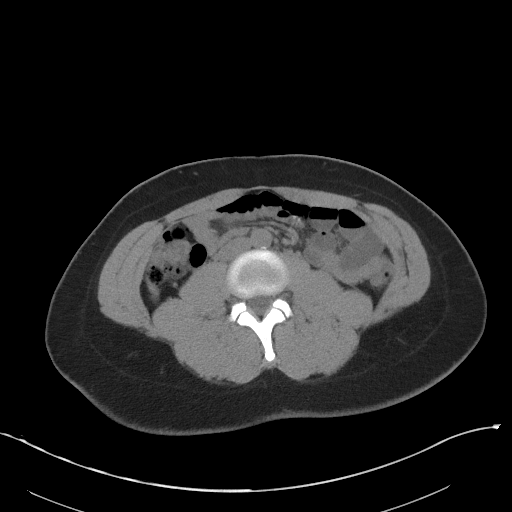
[im 60/92  soft-tissue]
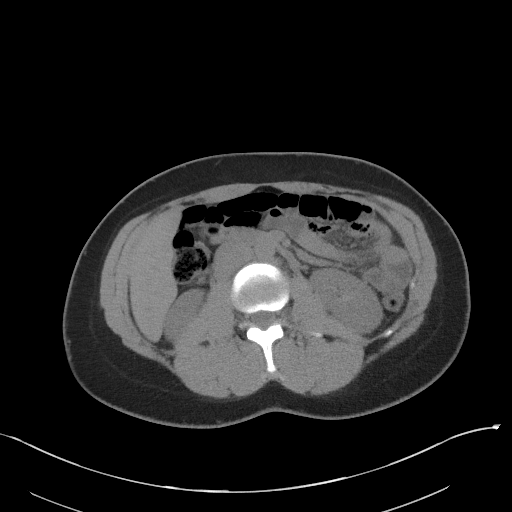
[im 60/92  bone]
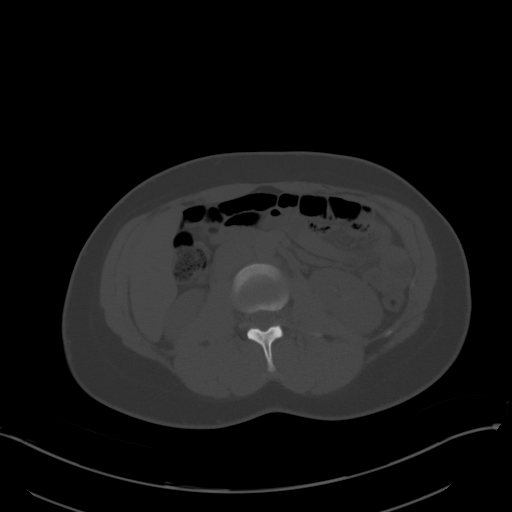
[im 67/92  soft-tissue]
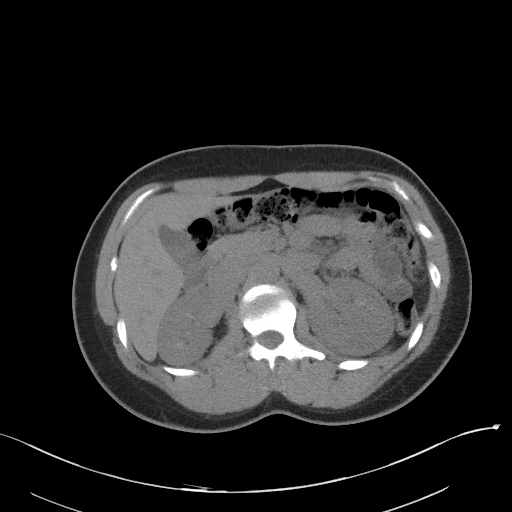
[im 74/92  soft-tissue]
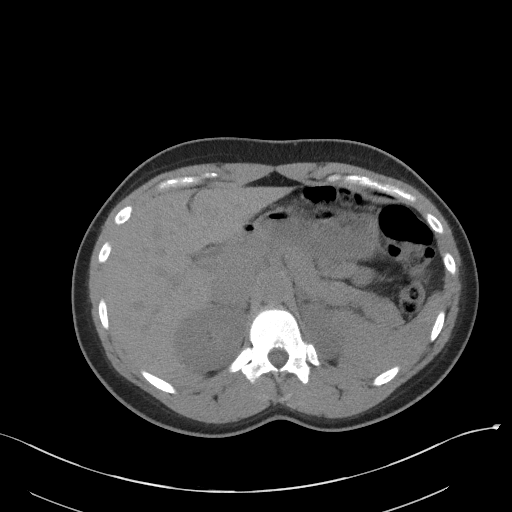
[im 81/92  soft-tissue]
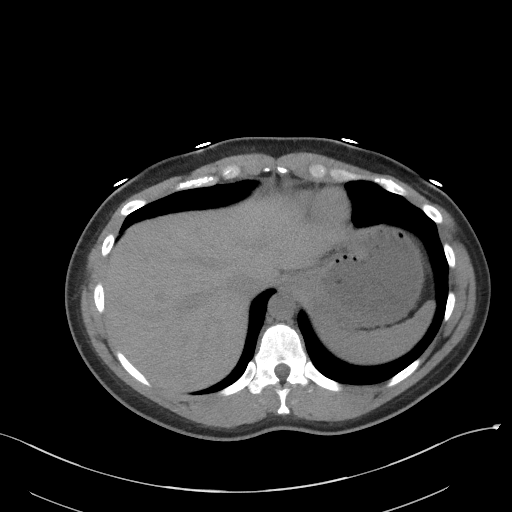
[im 88/92  soft-tissue]
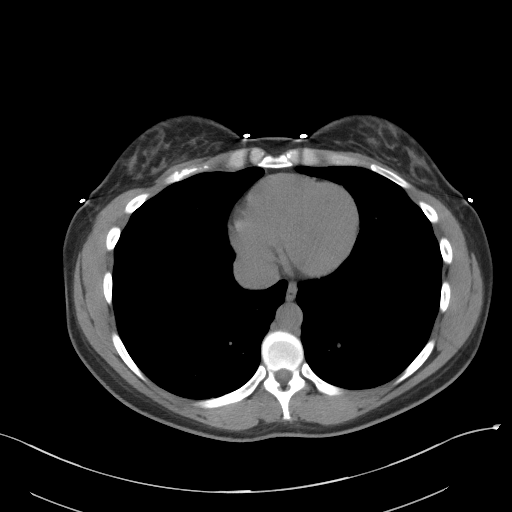

[Series 5: coronal st · coronal · 0.86mm/px · 3 of 77 slices shown]
[im 26/77  soft-tissue]
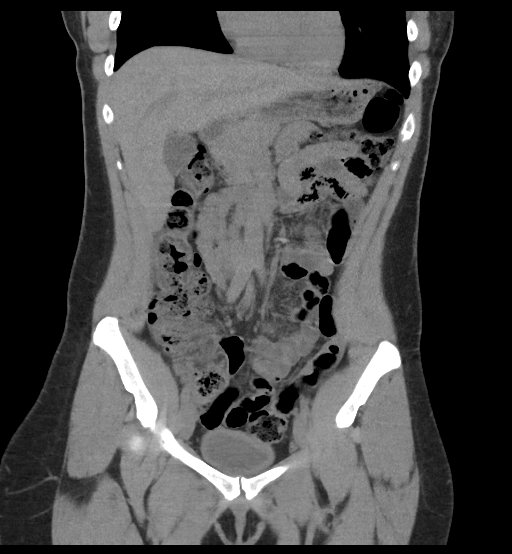
[im 34/77  soft-tissue]
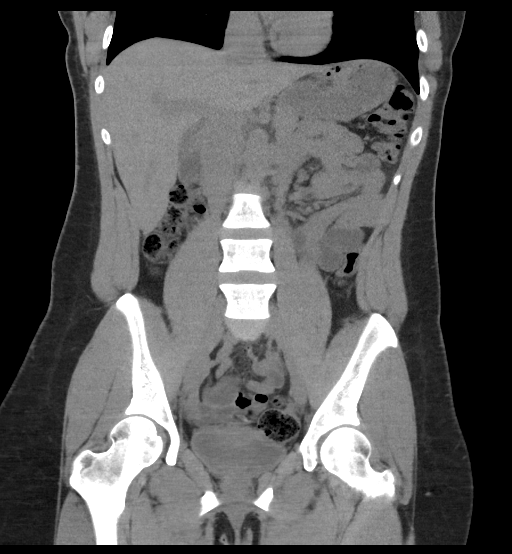
[im 43/77  soft-tissue]
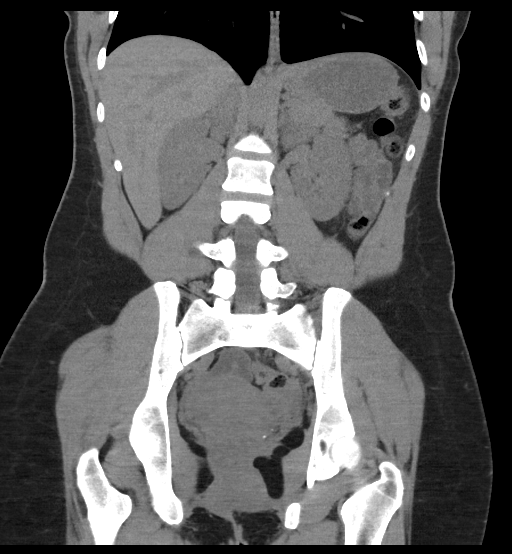

[16 of 46 positions shown; findings below may reference images not displayed]

FINDINGS: Lower chest: The lung bases are clear. There are a few small
circumscribed lucencies in the lungs bilaterally which may represent
mild centrilobular emphysema, or small benign pulmonary cysts.
Visualized cardiac structures are within normal limits for size. No
pericardial effusion. Unremarkable visualized distal thoracic
esophagus.

Hepatobiliary: Normal hepatic contour and morphology. No discrete
hepatic lesions. Normal appearance of the gallbladder. No intra or
extrahepatic biliary ductal dilatation.

Pancreas: Unremarkable. No pancreatic ductal dilatation or
surrounding inflammatory changes.

Spleen: Normal in size without focal abnormality.

Adrenals/Urinary Tract: Adrenal glands are unremarkable. Kidneys are
normal, without renal calculi, focal lesion, or hydronephrosis.
Bladder is unremarkable.

Stomach/Bowel: Stomach is within normal limits. Appendix appears
normal. No evidence of bowel wall thickening, distention, or
inflammatory changes.

Vascular/Lymphatic: Limited evaluation in the absence of intravenous
contrast. No aneurysm or significant atherosclerotic plaque. No
suspicious lymphadenopathy.

Reproductive: Water attenuation cystic structure in the left pelvic
cul-de-sac likely represents an ovarian cyst. Unremarkable uterus
and right adnexa. The bladder is unremarkable.

Other: No abdominal wall hernia or abnormality. No abdominopelvic
ascites.

Musculoskeletal: No acute fracture or aggressive appearing lytic or
blastic osseous lesion.
IMPRESSION: 1. No acute abnormality within the abdomen or pelvis.
2. Mild centrilobular pulmonary emphysema or small benign bilateral
pulmonary cysts.
3. Probable left ovarian cyst.

## 2019-09-20 DIAGNOSIS — Z3046 Encounter for surveillance of implantable subdermal contraceptive: Secondary | ICD-10-CM | POA: Diagnosis not present

## 2019-09-20 DIAGNOSIS — Z114 Encounter for screening for human immunodeficiency virus [HIV]: Secondary | ICD-10-CM | POA: Diagnosis not present

## 2019-09-20 DIAGNOSIS — Z124 Encounter for screening for malignant neoplasm of cervix: Secondary | ICD-10-CM | POA: Diagnosis not present

## 2019-09-20 DIAGNOSIS — Z01419 Encounter for gynecological examination (general) (routine) without abnormal findings: Secondary | ICD-10-CM | POA: Diagnosis not present

## 2019-09-20 DIAGNOSIS — Z113 Encounter for screening for infections with a predominantly sexual mode of transmission: Secondary | ICD-10-CM | POA: Diagnosis not present

## 2020-01-14 DIAGNOSIS — R05 Cough: Secondary | ICD-10-CM | POA: Diagnosis not present

## 2020-01-14 DIAGNOSIS — Z20822 Contact with and (suspected) exposure to covid-19: Secondary | ICD-10-CM | POA: Diagnosis not present

## 2020-01-14 DIAGNOSIS — F17210 Nicotine dependence, cigarettes, uncomplicated: Secondary | ICD-10-CM | POA: Diagnosis not present

## 2020-01-14 DIAGNOSIS — J069 Acute upper respiratory infection, unspecified: Secondary | ICD-10-CM | POA: Diagnosis not present

## 2020-05-23 DIAGNOSIS — Z20822 Contact with and (suspected) exposure to covid-19: Secondary | ICD-10-CM | POA: Diagnosis not present

## 2020-05-23 DIAGNOSIS — R519 Headache, unspecified: Secondary | ICD-10-CM | POA: Diagnosis not present

## 2020-05-23 DIAGNOSIS — Z1152 Encounter for screening for COVID-19: Secondary | ICD-10-CM | POA: Diagnosis not present

## 2020-05-23 DIAGNOSIS — F3181 Bipolar II disorder: Secondary | ICD-10-CM | POA: Diagnosis not present

## 2020-05-23 DIAGNOSIS — R110 Nausea: Secondary | ICD-10-CM | POA: Diagnosis not present

## 2020-06-01 DIAGNOSIS — F4310 Post-traumatic stress disorder, unspecified: Secondary | ICD-10-CM | POA: Diagnosis not present

## 2020-06-01 DIAGNOSIS — F411 Generalized anxiety disorder: Secondary | ICD-10-CM | POA: Diagnosis not present

## 2020-06-01 DIAGNOSIS — F332 Major depressive disorder, recurrent severe without psychotic features: Secondary | ICD-10-CM | POA: Diagnosis not present

## 2020-06-01 DIAGNOSIS — Z79899 Other long term (current) drug therapy: Secondary | ICD-10-CM | POA: Diagnosis not present

## 2020-06-01 DIAGNOSIS — R5383 Other fatigue: Secondary | ICD-10-CM | POA: Diagnosis not present

## 2020-06-01 DIAGNOSIS — E559 Vitamin D deficiency, unspecified: Secondary | ICD-10-CM | POA: Diagnosis not present

## 2020-06-01 DIAGNOSIS — F410 Panic disorder [episodic paroxysmal anxiety] without agoraphobia: Secondary | ICD-10-CM | POA: Diagnosis not present

## 2020-06-13 DIAGNOSIS — F332 Major depressive disorder, recurrent severe without psychotic features: Secondary | ICD-10-CM | POA: Diagnosis not present

## 2020-06-13 DIAGNOSIS — F411 Generalized anxiety disorder: Secondary | ICD-10-CM | POA: Diagnosis not present

## 2020-06-13 DIAGNOSIS — F4310 Post-traumatic stress disorder, unspecified: Secondary | ICD-10-CM | POA: Diagnosis not present

## 2020-06-13 DIAGNOSIS — F410 Panic disorder [episodic paroxysmal anxiety] without agoraphobia: Secondary | ICD-10-CM | POA: Diagnosis not present

## 2020-06-29 DIAGNOSIS — F17210 Nicotine dependence, cigarettes, uncomplicated: Secondary | ICD-10-CM | POA: Diagnosis not present

## 2020-06-29 DIAGNOSIS — F4310 Post-traumatic stress disorder, unspecified: Secondary | ICD-10-CM | POA: Diagnosis not present

## 2020-06-29 DIAGNOSIS — F410 Panic disorder [episodic paroxysmal anxiety] without agoraphobia: Secondary | ICD-10-CM | POA: Diagnosis not present

## 2020-06-29 DIAGNOSIS — F324 Major depressive disorder, single episode, in partial remission: Secondary | ICD-10-CM | POA: Diagnosis not present

## 2020-06-29 DIAGNOSIS — F411 Generalized anxiety disorder: Secondary | ICD-10-CM | POA: Diagnosis not present

## 2020-06-29 DIAGNOSIS — Z716 Tobacco abuse counseling: Secondary | ICD-10-CM | POA: Diagnosis not present

## 2020-07-04 DIAGNOSIS — F410 Panic disorder [episodic paroxysmal anxiety] without agoraphobia: Secondary | ICD-10-CM | POA: Diagnosis not present

## 2020-07-04 DIAGNOSIS — F411 Generalized anxiety disorder: Secondary | ICD-10-CM | POA: Diagnosis not present

## 2020-07-04 DIAGNOSIS — F4310 Post-traumatic stress disorder, unspecified: Secondary | ICD-10-CM | POA: Diagnosis not present

## 2020-08-01 DIAGNOSIS — Z716 Tobacco abuse counseling: Secondary | ICD-10-CM | POA: Diagnosis not present

## 2020-08-01 DIAGNOSIS — F411 Generalized anxiety disorder: Secondary | ICD-10-CM | POA: Diagnosis not present

## 2020-08-01 DIAGNOSIS — F410 Panic disorder [episodic paroxysmal anxiety] without agoraphobia: Secondary | ICD-10-CM | POA: Diagnosis not present

## 2020-08-01 DIAGNOSIS — F17210 Nicotine dependence, cigarettes, uncomplicated: Secondary | ICD-10-CM | POA: Diagnosis not present

## 2020-08-01 DIAGNOSIS — F324 Major depressive disorder, single episode, in partial remission: Secondary | ICD-10-CM | POA: Diagnosis not present

## 2020-08-01 DIAGNOSIS — F4310 Post-traumatic stress disorder, unspecified: Secondary | ICD-10-CM | POA: Diagnosis not present

## 2020-09-26 DIAGNOSIS — F324 Major depressive disorder, single episode, in partial remission: Secondary | ICD-10-CM | POA: Diagnosis not present

## 2020-09-26 DIAGNOSIS — F4310 Post-traumatic stress disorder, unspecified: Secondary | ICD-10-CM | POA: Diagnosis not present

## 2020-09-26 DIAGNOSIS — F411 Generalized anxiety disorder: Secondary | ICD-10-CM | POA: Diagnosis not present

## 2020-09-26 DIAGNOSIS — F17210 Nicotine dependence, cigarettes, uncomplicated: Secondary | ICD-10-CM | POA: Diagnosis not present

## 2020-09-26 DIAGNOSIS — Z716 Tobacco abuse counseling: Secondary | ICD-10-CM | POA: Diagnosis not present

## 2020-09-26 DIAGNOSIS — F410 Panic disorder [episodic paroxysmal anxiety] without agoraphobia: Secondary | ICD-10-CM | POA: Diagnosis not present

## 2020-11-27 DIAGNOSIS — F324 Major depressive disorder, single episode, in partial remission: Secondary | ICD-10-CM | POA: Diagnosis not present

## 2020-11-27 DIAGNOSIS — F410 Panic disorder [episodic paroxysmal anxiety] without agoraphobia: Secondary | ICD-10-CM | POA: Diagnosis not present

## 2020-11-27 DIAGNOSIS — F4310 Post-traumatic stress disorder, unspecified: Secondary | ICD-10-CM | POA: Diagnosis not present

## 2020-11-27 DIAGNOSIS — F411 Generalized anxiety disorder: Secondary | ICD-10-CM | POA: Diagnosis not present

## 2021-04-16 DIAGNOSIS — F324 Major depressive disorder, single episode, in partial remission: Secondary | ICD-10-CM | POA: Diagnosis not present

## 2021-04-16 DIAGNOSIS — F17200 Nicotine dependence, unspecified, uncomplicated: Secondary | ICD-10-CM | POA: Diagnosis not present

## 2021-04-16 DIAGNOSIS — F17210 Nicotine dependence, cigarettes, uncomplicated: Secondary | ICD-10-CM | POA: Diagnosis not present

## 2021-04-16 DIAGNOSIS — F410 Panic disorder [episodic paroxysmal anxiety] without agoraphobia: Secondary | ICD-10-CM | POA: Diagnosis not present

## 2021-04-16 DIAGNOSIS — F4310 Post-traumatic stress disorder, unspecified: Secondary | ICD-10-CM | POA: Diagnosis not present

## 2021-04-16 DIAGNOSIS — F411 Generalized anxiety disorder: Secondary | ICD-10-CM | POA: Diagnosis not present

## 2021-06-12 DIAGNOSIS — H5213 Myopia, bilateral: Secondary | ICD-10-CM | POA: Diagnosis not present

## 2021-09-22 ENCOUNTER — Encounter (HOSPITAL_BASED_OUTPATIENT_CLINIC_OR_DEPARTMENT_OTHER): Payer: Self-pay | Admitting: Emergency Medicine

## 2021-09-22 ENCOUNTER — Emergency Department (HOSPITAL_BASED_OUTPATIENT_CLINIC_OR_DEPARTMENT_OTHER)
Admission: EM | Admit: 2021-09-22 | Discharge: 2021-09-22 | Disposition: A | Discharge status: Home / Self Care | Payer: No Typology Code available for payment source | Attending: Emergency Medicine | Admitting: Emergency Medicine

## 2021-09-22 ENCOUNTER — Other Ambulatory Visit: Payer: Self-pay

## 2021-09-22 DIAGNOSIS — R590 Localized enlarged lymph nodes: Secondary | ICD-10-CM | POA: Insufficient documentation

## 2021-09-22 DIAGNOSIS — I10 Essential (primary) hypertension: Secondary | ICD-10-CM | POA: Insufficient documentation

## 2021-09-22 DIAGNOSIS — R220 Localized swelling, mass and lump, head: Secondary | ICD-10-CM | POA: Diagnosis present

## 2021-09-22 MED ORDER — DOXYCYCLINE HYCLATE 100 MG PO CAPS: 100.0000 mg | ORAL_CAPSULE | Freq: Two times a day (BID) | ORAL | 0 refills | Status: AC

## 2021-09-22 NOTE — Discharge Instructions (Signed)
You were seen in the emergency room today with some neck discomfort.  Not feel this is exactly an abscess but we will start you on some antibiotic in case had some developing infection.  I suspect that your lymph nodes are swollen they are.  You can apply warm compress.  Take Tylenol/ibuprofen as needed for pain.  Follow-up with your primary care doctor.  If this area continues to be swollen and tender they may consider additional testing or referral as needed.

## 2021-09-22 NOTE — ED Triage Notes (Signed)
Patient c/o lump to the back of her head, right in her hairline. States she pushed on the lump and it got smaller, denies any discharge. C/o pain to touch and pain above site. Increased pain when she leans her head back.

## 2021-09-22 NOTE — ED Provider Notes (Signed)
Emergency Department Provider Note   I have reviewed the triage vital signs and the nursing notes.   HISTORY  Chief Complaint Abscess   HPI Alison Coffey is a 36 y.o. female presents to the ED with painful bump just behind the left ear. She noticed some swelling there along with mild discomfort within the last 2 days. No apparent rash. No earache or sore throat. No tinnitus. No HA. She presents today with concern for possible infectious etiology of symptoms.    Past Medical History:  Diagnosis Date   Abnormal Pap smear 2005   Anemia    Carpal tunnel syndrome    Depression    Headache(784.0)    Hypertension    Pregnancy induced hypertension    Preterm labor    Sickle cell trait (HCC)     Review of Systems  Constitutional: No fever/chills Eyes: No visual changes. ENT: No sore throat. Painful bump behind left ear.  Cardiovascular: Denies chest pain. Respiratory: Denies shortness of breath. Gastrointestinal: No abdominal pain.  No nausea, no vomiting.  No diarrhea.  No constipation. Genitourinary: Negative for dysuria. Musculoskeletal: Negative for back pain. Skin: Negative for rash. Neurological: Negative for headaches, focal weakness or numbness.   ____________________________________________   PHYSICAL EXAM:  VITAL SIGNS: ED Triage Vitals  Enc Vitals Group     BP 09/22/21 2039 115/90     Pulse Rate 09/22/21 2039 76     Resp 09/22/21 2039 18     Temp 09/22/21 2039 99.2 F (37.3 C)     Temp Source 09/22/21 2039 Oral     SpO2 09/22/21 2039 100 %     Weight 09/22/21 2039 177 lb 3.2 oz (80.4 kg)     Height 09/22/21 2039 5\' 6"  (1.676 m)   Constitutional: Alert and oriented. Well appearing and in no acute distress. Eyes: Conjunctivae are normal.  Head: Atraumatic. Nose: No congestion/rhinnorhea. Mouth/Throat: Mucous membranes are moist.  Oropharynx non-erythematous. Neck: No stridor. Left posterior auricular lymph node palpated as the source of  discomfort. No erythema or fluctuance. Node approx 1 cm. No mastoid tenderness. Clear ear canals and TMs bilaterally.  Cardiovascular: Normal rate, regular rhythm. Good peripheral circulation. Grossly normal heart sounds.   Respiratory: Normal respiratory effort.  No retractions. Lungs CTAB. Gastrointestinal: Soft and nontender. No distention.  Musculoskeletal: No lower extremity tenderness nor edema. No gross deformities of extremities. Neurologic:  Normal speech and language. No gross focal neurologic deficits are appreciated.  Skin:  Skin is warm, dry and intact. No rash noted.   ____________________________________________   PROCEDURES  Procedure(s) performed:   Procedures  None ____________________________________________   INITIAL IMPRESSION / ASSESSMENT AND PLAN / ED COURSE  Pertinent labs & imaging results that were available during my care of the patient were reviewed by me and considered in my medical decision making (see chart for details).   This patient is Presenting for Evaluation of neck mass, which does require a range of treatment options, and is a complaint that involves a high risk of morbidity and mortality.  The Differential Diagnoses include lymphadenopathy, abscess, cellulitis, mastoiditis.   Radiologic Tests: Considered CT imaging and labs but area is small/mobile. No clear indication for emergent imaging.   Medical Decision Making: Summary:  Patient with appears to be a posterior auricular lymph node. No clear abscess. Will cover with Doxycycline in case there is some developing cellulitis but low suspicion for this overall.   Reevaluation with update and discussion with patient. Discussed plan for warm  compress and abx with PCP follow up. Discussed that if initial therapy does not work she may benefit from ENT follow up but would defer that for now.   Disposition: discharge  ____________________________________________  FINAL CLINICAL IMPRESSION(S)  / ED DIAGNOSES  Final diagnoses:  Posterior auricular lymphadenopathy     NEW OUTPATIENT MEDICATIONS STARTED DURING THIS VISIT:  Discharge Medication List as of 09/22/2021  9:48 PM     START taking these medications   Details  doxycycline (VIBRAMYCIN) 100 MG capsule Take 1 capsule (100 mg total) by mouth 2 (two) times daily for 7 days., Starting Mon 09/22/2021, Until Mon 09/29/2021, Normal        Note:  This document was prepared using Dragon voice recognition software and may include unintentional dictation errors.  Nanda Quinton, MD, [facility]    Crysta Gulick, [facility], MD 09/23/21 [phone]

## 2022-01-28 ENCOUNTER — Emergency Department (HOSPITAL_BASED_OUTPATIENT_CLINIC_OR_DEPARTMENT_OTHER): Payer: No Typology Code available for payment source

## 2022-01-28 ENCOUNTER — Other Ambulatory Visit: Payer: Self-pay

## 2022-01-28 ENCOUNTER — Encounter (HOSPITAL_BASED_OUTPATIENT_CLINIC_OR_DEPARTMENT_OTHER): Payer: Self-pay

## 2022-01-28 ENCOUNTER — Emergency Department (HOSPITAL_BASED_OUTPATIENT_CLINIC_OR_DEPARTMENT_OTHER)
Admission: EM | Admit: 2022-01-28 | Discharge: 2022-01-28 | Disposition: A | Discharge status: Home / Self Care | Payer: No Typology Code available for payment source | Attending: Emergency Medicine | Admitting: Emergency Medicine

## 2022-01-28 DIAGNOSIS — R519 Headache, unspecified: Secondary | ICD-10-CM

## 2022-01-28 DIAGNOSIS — R42 Dizziness and giddiness: Secondary | ICD-10-CM | POA: Diagnosis not present

## 2022-01-28 DIAGNOSIS — R200 Anesthesia of skin: Secondary | ICD-10-CM | POA: Insufficient documentation

## 2022-01-28 DIAGNOSIS — Z20822 Contact with and (suspected) exposure to covid-19: Secondary | ICD-10-CM | POA: Diagnosis not present

## 2022-01-28 DIAGNOSIS — I10 Essential (primary) hypertension: Secondary | ICD-10-CM | POA: Insufficient documentation

## 2022-01-28 LAB — URINALYSIS, ROUTINE W REFLEX MICROSCOPIC
Bilirubin Urine: NEGATIVE
Glucose, UA: NEGATIVE mg/dL
Ketones, ur: NEGATIVE mg/dL
Leukocytes,Ua: NEGATIVE
Nitrite: NEGATIVE
Protein, ur: 30 mg/dL — AB
Specific Gravity, Urine: 1.025 (ref 1.005–1.030)
pH: 7 (ref 5.0–8.0)

## 2022-01-28 LAB — COMPREHENSIVE METABOLIC PANEL
ALT: 9 U/L (ref 0–44)
AST: 13 U/L — ABNORMAL LOW (ref 15–41)
Albumin: 4.1 g/dL (ref 3.5–5.0)
Alkaline Phosphatase: 64 U/L (ref 38–126)
Anion gap: 6 (ref 5–15)
BUN: 11 mg/dL (ref 6–20)
CO2: 26 mmol/L (ref 22–32)
Calcium: 8.9 mg/dL (ref 8.9–10.3)
Chloride: 105 mmol/L (ref 98–111)
Creatinine, Ser: 0.78 mg/dL (ref 0.44–1.00)
GFR, Estimated: >60 mL/min (ref >60)
Glucose, Bld: 91 mg/dL (ref 70–99)
Potassium: 4.2 mmol/L (ref 3.5–5.1)
Sodium: 137 mmol/L (ref 135–145)
Total Bilirubin: 0.6 mg/dL (ref 0.3–1.2)
Total Protein: 7.5 g/dL (ref 6.5–8.1)

## 2022-01-28 LAB — CBC WITH DIFFERENTIAL/PLATELET
Abs Immature Granulocytes: 0.02 10*3/uL (ref 0.00–0.07)
Basophils Absolute: 0 10*3/uL (ref 0.0–0.1)
Basophils Relative: 0 %
Eosinophils Absolute: 0.1 10*3/uL (ref 0.0–0.5)
Eosinophils Relative: 1 %
HCT: 37.2 % (ref 36.0–46.0)
Hemoglobin: 13.4 g/dL (ref 12.0–15.0)
Immature Granulocytes: 0 %
Lymphocytes Relative: 30 %
Lymphs Abs: 2 10*3/uL (ref 0.7–4.0)
MCH: 28.3 pg (ref 26.0–34.0)
MCHC: 36 g/dL (ref 30.0–36.0)
MCV: 78.5 fL — ABNORMAL LOW (ref 80.0–100.0)
Monocytes Absolute: 0.4 10*3/uL (ref 0.1–1.0)
Monocytes Relative: 6 %
Neutro Abs: 4.4 10*3/uL (ref 1.7–7.7)
Neutrophils Relative %: 63 %
Platelets: 277 10*3/uL (ref 150–400)
RBC: 4.74 MIL/uL (ref 3.87–5.11)
RDW: 12.8 % (ref 11.5–15.5)
WBC: 6.9 10*3/uL (ref 4.0–10.5)
nRBC: 0 % (ref 0.0–0.2)

## 2022-01-28 LAB — PREGNANCY, URINE: Preg Test, Ur: NEGATIVE

## 2022-01-28 LAB — URINALYSIS, MICROSCOPIC (REFLEX)

## 2022-01-28 LAB — RESP PANEL BY RT-PCR (FLU A&B, COVID) ARPGX2
Influenza A by PCR: NEGATIVE
Influenza B by PCR: NEGATIVE
SARS Coronavirus 2 by RT PCR: NEGATIVE

## 2022-01-28 LAB — CBG MONITORING, ED: Glucose-Capillary: 107 mg/dL — ABNORMAL HIGH (ref 70–99)

## 2022-01-28 LAB — TROPONIN I (HIGH SENSITIVITY): Troponin I (High Sensitivity): <2 ng/L (ref <18)

## 2022-01-28 MED ORDER — DEXAMETHASONE SODIUM PHOSPHATE 10 MG/ML IJ SOLN
10.0000 mg | Freq: Once | INTRAMUSCULAR | Status: AC
  Administered 2022-01-28: 10 mg via INTRAVENOUS
  Filled 2022-01-28: qty 1

## 2022-01-28 MED ORDER — KETOROLAC TROMETHAMINE 15 MG/ML IJ SOLN
15.0000 mg | Freq: Once | INTRAMUSCULAR | Status: AC
  Administered 2022-01-28: 15 mg via INTRAVENOUS
  Filled 2022-01-28: qty 1

## 2022-01-28 MED ORDER — METOCLOPRAMIDE HCL 5 MG/ML IJ SOLN
10.0000 mg | Freq: Once | INTRAMUSCULAR | Status: AC
  Administered 2022-01-28: 10 mg via INTRAVENOUS
  Filled 2022-01-28: qty 2

## 2022-01-28 NOTE — ED Notes (Signed)
To CT

## 2022-01-28 NOTE — Discharge Instructions (Addendum)
You were seen in the ER for evaluation of your headache. Your labs and imaging were unremarkable. I likely think this is a complex migraine. I am glad you are feeling better after the medications. I am sending you the information to a PCP for you to follow up with. If you have any concerns, new or worsening symptoms, please return to the ER immediately.   Contact a doctor if: Medicine does not help your symptoms. You have a headache that feels different than the other headaches. You feel like you may vomit (nauseous) or you vomit. You have a fever. Get help right away if: Your headache: Gets very bad quickly. Gets worse after a lot of physical activity. You have any of these symptoms: You continue to vomit. A stiff neck. Trouble seeing. Your eye or ear hurts. Trouble speaking. Weak muscles or you lose muscle control. You lose your balance or have trouble walking. You feel like you will pass out (faint) or you pass out. You are mixed up (confused). You have a seizure. These symptoms may be an emergency. Get help right away. Call your local emergency services (911 in the [place] Do not wait to see if the symptoms will go away. Do not drive yourself to the hospital.

## 2022-01-28 NOTE — ED Provider Notes (Signed)
[facility] EMERGENCY DEPARTMENT Provider Note   CSN: [id number] Arrival date & time: 01/28/22  1017     History Chief Complaint  Patient presents with   Dizziness    Alison Coffey is a 36 y.o. female with history of migraines and hypertension presents to the emergency department for evaluation of a numbness and "feeling off".  Patient reports that yesterday while she was driving she was having some left arm numbness but relieved whenever she stopped leaning her elbow on the door while she was driving.  She reports that she was cleaning dinner last night and still was having some numbness and pain but was able to sleep through the night.  She reports that she woke up she had a right-sided headache and felt fatigued although she slept well.  She reports that she started driving again when she started to have the numbness and tingling down her left arm except this time it lasted longer.  She reports that she feels like she is in a fog.  While she was in the waiting room, she reports that she had some left peripheral vision blurriness but has since resolved.  Denies any chest pain or shortness of breath.  She reports that she has been under recent stress lately.  She is concerned that she may have had a stroke given that she just recently buried her grandparents who both died of strokes.  She is insistent that she is a single mother with 5 kids and wants to make sure that she is okay.  Not sure why the nursing note says dizziness, patient denied any dizziness or lightheadedness.  She denies any feeling of faintness or being woozy.  Patient reports that this is not felt like her typical migraine because usually her headaches are way worse with her migraines.   Dizziness Associated symptoms: headaches   Associated symptoms: no chest pain, no nausea, no palpitations, no shortness of breath, no vomiting and no weakness        Home Medications Prior to Admission medications    Medication Sig Start Date End Date Taking? Authorizing Provider  FLUoxetine (PROZAC) 10 MG tablet Take 1 tablet (10 mg total) by mouth daily. 12/05/18   Wieters, Hallie C, PA-C  fluticasone (FLONASE) 50 MCG/ACT nasal spray Place 2 sprays into both nostrils daily. 11/30/18   Cathie Hoops, Amy V, PA-C  hydrOXYzine (ATARAX/VISTARIL) 25 MG tablet Take 1 tablet (25 mg total) by mouth every 6 (six) hours. 12/05/18   Wieters, Hallie C, PA-C  ondansetron (ZOFRAN) 4 MG tablet Take 1 tablet (4 mg total) by mouth every 6 (six) hours. 08/17/18   Wurst, [place], PA-C  predniSONE (DELTASONE) 50 MG tablet Take 1 tablet (50 mg total) by mouth daily with breakfast. 11/30/18   Cathie Hoops, Amy V, PA-C  SUMAtriptan (IMITREX) 100 MG tablet Take 1 tablet earliest onset of migraine.  May repeat x1 in 2 hours if headache persists or recurs.  Do not exceed 2 tablets in 24h 01/19/18   Everlena Cooper, Adam R, DO  topiramate (TOPAMAX) 50 MG tablet Take 1 tablet (50 mg total) by mouth at bedtime. 01/19/18   Drema Dallas, DO      Allergies    Oxycodone    Review of Systems   Review of Systems  Constitutional:  Negative for chills and fever.  Eyes:  Positive for visual disturbance.  Respiratory:  Negative for shortness of breath.   Cardiovascular:  Negative for chest pain and palpitations.  Gastrointestinal:  Negative for  abdominal pain, nausea and vomiting.  Genitourinary:  Negative for dysuria and hematuria.  Musculoskeletal:  Negative for back pain and neck pain.  Neurological:  Positive for numbness and headaches. Negative for dizziness, syncope, weakness and light-headedness.    Physical Exam Updated Vital Signs BP 113/82   Pulse 75   Temp 98.3 F (36.8 C) (Oral)   Resp 18   Ht 5\' 6"  (1.676 m)   Wt 77.1 kg   LMP 01/21/2022   SpO2 99%   BMI 27.44 kg/m  Physical Exam Vitals and nursing note reviewed.  Constitutional:      General: She is not in acute distress.    Appearance: Normal appearance. She is not ill-appearing or  toxic-appearing.  HENT:     Head: Normocephalic and atraumatic.     Nose: Nose normal.     Mouth/Throat:     Mouth: Mucous membranes are moist.  Eyes:     General: No scleral icterus.    Extraocular Movements: Extraocular movements intact.     Pupils: Pupils are equal, round, and reactive to light.  Cardiovascular:     Rate and Rhythm: Normal rate and regular rhythm.  Pulmonary:     Effort: Pulmonary effort is normal. No respiratory distress.     Breath sounds: Normal breath sounds.  Abdominal:     General: Abdomen is flat. Bowel sounds are normal.     Palpations: Abdomen is soft.     Tenderness: There is no abdominal tenderness. There is no guarding or rebound.  Musculoskeletal:        General: No deformity.     Cervical back: Normal range of motion. No rigidity or tenderness.  Skin:    General: Skin is warm and dry.  Neurological:     General: No focal deficit present.     Mental Status: She is alert. Mental status is at baseline.     GCS: GCS eye subscore is 4. GCS verbal subscore is 5. GCS motor subscore is 6.     Cranial Nerves: No cranial nerve deficit, dysarthria or facial asymmetry.     Sensory: No sensory deficit.     Motor: No weakness or pronator drift.     Coordination: Finger-Nose-Finger Test normal.     Gait: Gait normal.     Comments: Patient alert and oriented.  GCS 15.  Cranial nerves II through XII intact.  No facial asymmetry noted.  Patient is answering questions appropriately with appropriate speech.  Sensation intact throughout.  No pronator drift.  Patient has equal strength in upper and lower bilateral extremities with and without resistance.  Normal finger-nose.  Normal gait.     ED Results / Procedures / Treatments   Labs (all labs ordered are listed, but only abnormal results are displayed) Labs Reviewed  CBC WITH DIFFERENTIAL/PLATELET - Abnormal; Notable for the following components:      Result Value   MCV 78.5 (*)    All other components  within normal limits  COMPREHENSIVE METABOLIC PANEL - Abnormal; Notable for the following components:   AST 13 (*)    All other components within normal limits  URINALYSIS, ROUTINE W REFLEX MICROSCOPIC - Abnormal; Notable for the following components:   Hgb urine dipstick LARGE (*)    Protein, ur 30 (*)    All other components within normal limits  URINALYSIS, MICROSCOPIC (REFLEX) - Abnormal; Notable for the following components:   Bacteria, UA MANY (*)    All other components within normal limits  CBG  MONITORING, ED - Abnormal; Notable for the following components:   Glucose-Capillary 107 (*)    All other components within normal limits  RESP PANEL BY RT-PCR (FLU A&B, COVID) ARPGX2  PREGNANCY, URINE  TROPONIN I (HIGH SENSITIVITY)  TROPONIN I (HIGH SENSITIVITY)    EKG None  Radiology CT Head Wo Contrast  Result Date: 01/28/2022 CLINICAL DATA:  Dizziness and headache and left arm numbness since yesterday. EXAM: CT HEAD WITHOUT CONTRAST TECHNIQUE: Contiguous axial images were obtained from the base of the skull through the vertex without intravenous contrast. RADIATION DOSE REDUCTION: This exam was performed according to the departmental dose-optimization program which includes automated exposure control, adjustment of the mA and/or kV according to patient size and/or use of iterative reconstruction technique. COMPARISON:  None Available. FINDINGS: Brain: The ventricles are normal in size and configuration. No extra-axial fluid collections are identified. The gray-white differentiation is maintained. No CT findings for acute hemispheric infarction or intracranial hemorrhage. No mass lesions. The brainstem and cerebellum are normal. Vascular: With Skull: No acute skull fracture.  No bone lesion. Sinuses/Orbits: The paranasal sinuses and mastoid air cells are clear. The globes are intact. Other: No scalp lesions, laceration or hematoma. IMPRESSION: Normal head CT. Electronically Signed   By:  Rudie Meyer M.D.   On: 01/28/2022 12:58    Procedures Procedures   Medications Ordered in ED Medications  ketorolac (TORADOL) 15 MG/ML injection 15 mg (15 mg Intravenous Given 01/28/22 1209)  metoCLOPramide (REGLAN) injection 10 mg (10 mg Intravenous Given 01/28/22 1209)  dexamethasone (DECADRON) injection 10 mg (10 mg Intravenous Given 01/28/22 1209)    ED Course/ Medical Decision Making/ A&P Clinical Course as of 01/28/22 1419  Wed Jan 28, 2022  1134 Stable. 11 YOF with a chief complaint of dizziness. HX of Migraines and HTN. Left arm symptoms last night while holding arm out the car door. Started having left eye visual blur and right sided headache this AM when she woke up. Leg tingling this AM. No motor symptoms.  CTH given duration of symptoms  [CC]  1136 [facility]: 28.3 [CC]    Clinical Course User Index [CC] Glyn Ade, MD                           Medical Decision Making Amount and/or Complexity of Data Reviewed Labs: ordered. Decision-making details documented in ED Course. Radiology: ordered.  Risk Prescription drug management.   36 year old female presents emerged department for evaluation of numbness to her left arm, headache, and left peripheral vision blurriness.  Differential diagnosis includes was not limited to subarachnoid hemorrhage, space-occupying lesion, demyelinating disease, migraine, complex migraine, bad headache.  Vital signs show normotensive, afebrile, normal pulse rate, satting well on room air without increased work of breathing.  Physical exam as noted above.  I discussed to my attending, the patient is out of any stroke window, will order some labs as well as a CT imaging of her head.  She also has a benign neurological exam.  I likely suspect this is a complex migraine.  Will order Toradol, Reglan, and Decadron.  I independently reviewed and interpreted the patient's labs.  CMP shows mildly decreased AST at 13 otherwise no electrolyte or LFT  abnormality.  CBC without cytosis or anemia.  Troponin undetectable.  Negative for COVID and flu.  Urinalysis shows large amount of hemoglobin with 30 protein.  There is 1120 red blood cells and 6-10 white blood cells with many bacteria but  there is also 6-10 squamous.  Patient does not have any urinary symptoms and is on her cycle currently.  This is likely a dirty catch.  CBC within normal limits.  We will culture urine.  CT head shows normal head CT.  On reevaluation, patient is sleeping with equal chest rise and no acute distress.  Monitor attached and shows good vital signs.  We will let patient sleep.  On reevaluation, I woke the patient up.  She reports she is feeling much better and does not have any of her symptoms anymore.  I likely think there is a complex migraine given that her migraine was on the right side and she was experiencing left-sided symptoms.  Her symptoms have completely resolved with the migraine cocktail.  She reports that she feels much better after sleeping during the visit as well.  I considered admission for these problems however given her reassuring lab and imaging, the patient safe for discharge.  I doubt any space-occupying lesion or subarachnoid hemorrhage.  Doubt any stroke or TIA given the relief after the migraine cocktail and benign neurological exam.  I discussed this case with my attending who agrees on discharge as well.  I discussed the lab and imaging results with the patient at bedside.  She reports that she was relieved and was just having a lot of anxiety given that she is a single mother to 5 kids and she needs to be there for her children.  We discussed following up with neurology for these complex migraines for her to be reevaluated.  We discussed strict return precautions and red flag symptoms.  Patient verbalized understanding and agrees to the plan.  Patient is stable being discharged home in good condition.  I discussed this case with my attending  physician who cosigned this note including patient's presenting symptoms, physical exam, and planned diagnostics and interventions. Attending physician stated agreement with plan or made changes to plan which were implemented.   Final Clinical Impression(s) / ED Diagnoses Final diagnoses:  Bad headache    Rx / DC Orders ED Discharge Orders     None         Sherrell Puller, PA-C 02/02/22 Estevan Ryder, MD 02/03/22 1436

## 2022-01-28 NOTE — ED Notes (Signed)
Pt states she drove herself to ER

## 2022-01-28 NOTE — ED Triage Notes (Signed)
States at 2000 yesterday left arm started going numb when she was driving home. Also had some dizziness. States she woke up with headache and fatigue this morning. States left arm numbness has been intermittent since yesterday. Denies it currently. States has a haze over left eye.   States has had increased stress recently Stroke screen negative, no weakness of sensory deficits noted.

## 2022-01-29 LAB — URINE CULTURE
